# Patient Record
Sex: Male | Born: 1960 | Race: White | Hispanic: No | Marital: Married | Smoking: Never smoker
Health system: Southern US, Community
[De-identification: ages and names within clinical notes are randomized; demographics above are authoritative.]

## PROBLEM LIST (undated history)

## (undated) DIAGNOSIS — F988 Other specified behavioral and emotional disorders with onset usually occurring in childhood and adolescence: Secondary | ICD-10-CM

## (undated) DIAGNOSIS — N289 Disorder of kidney and ureter, unspecified: Secondary | ICD-10-CM

## (undated) DIAGNOSIS — E119 Type 2 diabetes mellitus without complications: Secondary | ICD-10-CM

## (undated) DIAGNOSIS — Z5189 Encounter for other specified aftercare: Secondary | ICD-10-CM

## (undated) DIAGNOSIS — E785 Hyperlipidemia, unspecified: Secondary | ICD-10-CM

## (undated) DIAGNOSIS — M1990 Unspecified osteoarthritis, unspecified site: Secondary | ICD-10-CM

## (undated) DIAGNOSIS — E559 Vitamin D deficiency, unspecified: Secondary | ICD-10-CM

## (undated) DIAGNOSIS — M2550 Pain in unspecified joint: Secondary | ICD-10-CM

## (undated) DIAGNOSIS — K259 Gastric ulcer, unspecified as acute or chronic, without hemorrhage or perforation: Secondary | ICD-10-CM

## (undated) DIAGNOSIS — R55 Syncope and collapse: Secondary | ICD-10-CM

## (undated) DIAGNOSIS — T7840XA Allergy, unspecified, initial encounter: Secondary | ICD-10-CM

## (undated) DIAGNOSIS — I10 Essential (primary) hypertension: Secondary | ICD-10-CM

## (undated) HISTORY — DX: Pain in unspecified joint: M25.50

## (undated) HISTORY — DX: Hyperlipidemia, unspecified: E78.5

## (undated) HISTORY — DX: Vitamin D deficiency, unspecified: E55.9

## (undated) HISTORY — DX: Allergy, unspecified, initial encounter: T78.40XA

## (undated) HISTORY — DX: Unspecified osteoarthritis, unspecified site: M19.90

## (undated) HISTORY — DX: Other specified behavioral and emotional disorders with onset usually occurring in childhood and adolescence: F98.8

## (undated) HISTORY — PX: COLONOSCOPY: SHX174

## (undated) HISTORY — PX: FRACTURE SURGERY: SHX138

## (undated) HISTORY — PX: UPPER GASTROINTESTINAL ENDOSCOPY: SHX188

## (undated) HISTORY — DX: Syncope and collapse: R55

## (undated) HISTORY — DX: Encounter for other specified aftercare: Z51.89

## (undated) HISTORY — PX: POLYPECTOMY: SHX149

## (undated) HISTORY — DX: Type 2 diabetes mellitus without complications: E11.9

---

## 1997-09-03 ENCOUNTER — Emergency Department (HOSPITAL_COMMUNITY): Admission: EM | Admit: 1997-09-03 | Discharge: 1997-09-03 | Payer: Self-pay | Admitting: Emergency Medicine

## 2007-12-10 ENCOUNTER — Ambulatory Visit (HOSPITAL_COMMUNITY): Admission: AD | Admit: 2007-12-10 | Discharge: 2007-12-10 | Payer: Self-pay | Admitting: Urology

## 2011-07-20 ENCOUNTER — Ambulatory Visit (HOSPITAL_COMMUNITY)
Admission: RE | Admit: 2011-07-20 | Discharge: 2011-07-20 | Disposition: A | Discharge status: Home / Self Care | Payer: 59 | Source: Ambulatory Visit | Attending: Physician Assistant | Admitting: Physician Assistant

## 2011-07-20 ENCOUNTER — Other Ambulatory Visit (HOSPITAL_COMMUNITY): Payer: Self-pay | Admitting: Physician Assistant

## 2011-07-20 DIAGNOSIS — M25569 Pain in unspecified knee: Secondary | ICD-10-CM | POA: Insufficient documentation

## 2011-07-20 DIAGNOSIS — S838X9A Sprain of other specified parts of unspecified knee, initial encounter: Secondary | ICD-10-CM

## 2011-07-20 DIAGNOSIS — S86819A Strain of other muscle(s) and tendon(s) at lower leg level, unspecified leg, initial encounter: Secondary | ICD-10-CM

## 2011-09-28 DIAGNOSIS — IMO0001 Reserved for inherently not codable concepts without codable children: Secondary | ICD-10-CM

## 2011-09-28 DIAGNOSIS — Z5189 Encounter for other specified aftercare: Secondary | ICD-10-CM

## 2011-09-28 HISTORY — DX: Encounter for other specified aftercare: Z51.89

## 2011-09-28 HISTORY — DX: Reserved for inherently not codable concepts without codable children: IMO0001

## 2011-10-25 ENCOUNTER — Encounter (HOSPITAL_BASED_OUTPATIENT_CLINIC_OR_DEPARTMENT_OTHER): Payer: Self-pay | Admitting: *Deleted

## 2011-10-25 ENCOUNTER — Inpatient Hospital Stay (HOSPITAL_BASED_OUTPATIENT_CLINIC_OR_DEPARTMENT_OTHER)
Admission: EM | Admit: 2011-10-25 | Discharge: 2011-10-27 | DRG: 174 | Disposition: A | Discharge status: Home / Self Care | Payer: BC Managed Care – PPO | Attending: Internal Medicine | Admitting: Internal Medicine

## 2011-10-25 DIAGNOSIS — K264 Chronic or unspecified duodenal ulcer with hemorrhage: Principal | ICD-10-CM | POA: Diagnosis present

## 2011-10-25 DIAGNOSIS — D62 Acute posthemorrhagic anemia: Secondary | ICD-10-CM | POA: Diagnosis present

## 2011-10-25 DIAGNOSIS — K449 Diaphragmatic hernia without obstruction or gangrene: Secondary | ICD-10-CM | POA: Diagnosis present

## 2011-10-25 DIAGNOSIS — Z79899 Other long term (current) drug therapy: Secondary | ICD-10-CM

## 2011-10-25 DIAGNOSIS — Z7982 Long term (current) use of aspirin: Secondary | ICD-10-CM

## 2011-10-25 DIAGNOSIS — K922 Gastrointestinal hemorrhage, unspecified: Secondary | ICD-10-CM

## 2011-10-25 DIAGNOSIS — K921 Melena: Secondary | ICD-10-CM

## 2011-10-25 DIAGNOSIS — R7989 Other specified abnormal findings of blood chemistry: Secondary | ICD-10-CM | POA: Diagnosis present

## 2011-10-25 DIAGNOSIS — K222 Esophageal obstruction: Secondary | ICD-10-CM

## 2011-10-25 DIAGNOSIS — I10 Essential (primary) hypertension: Secondary | ICD-10-CM | POA: Diagnosis present

## 2011-10-25 HISTORY — DX: Gastric ulcer, unspecified as acute or chronic, without hemorrhage or perforation: K25.9

## 2011-10-25 HISTORY — DX: Essential (primary) hypertension: I10

## 2011-10-25 HISTORY — DX: Disorder of kidney and ureter, unspecified: N28.9

## 2011-10-25 LAB — COMPREHENSIVE METABOLIC PANEL
ALT: 26 U/L (ref 0–53)
AST: 25 U/L (ref 0–37)
Albumin: 3.7 g/dL (ref 3.5–5.2)
Alkaline Phosphatase: 34 U/L — ABNORMAL LOW (ref 39–117)
BUN: 24 mg/dL — ABNORMAL HIGH (ref 6–23)
CO2: 23 mEq/L (ref 19–32)
Calcium: 8.5 mg/dL (ref 8.4–10.5)
Chloride: 105 mEq/L (ref 96–112)
Creatinine, Ser: 1.1 mg/dL (ref 0.50–1.35)
GFR calc Af Amer: 89 mL/min — ABNORMAL LOW (ref >90)
GFR calc non Af Amer: 77 mL/min — ABNORMAL LOW (ref >90)
Glucose, Bld: 152 mg/dL — ABNORMAL HIGH (ref 70–99)
Potassium: 4.1 mEq/L (ref 3.5–5.1)
Sodium: 137 mEq/L (ref 135–145)
Total Bilirubin: 0.4 mg/dL (ref 0.3–1.2)
Total Protein: 6.2 g/dL (ref 6.0–8.3)

## 2011-10-25 LAB — DIFFERENTIAL
Basophils Absolute: 0 10*3/uL (ref 0.0–0.1)
Basophils Relative: 0 % (ref 0–1)
Eosinophils Absolute: 0.2 10*3/uL (ref 0.0–0.7)
Eosinophils Relative: 2 % (ref 0–5)
Lymphocytes Relative: 22 % (ref 12–46)
Lymphs Abs: 2.6 10*3/uL (ref 0.7–4.0)
Monocytes Absolute: 0.8 10*3/uL (ref 0.1–1.0)
Monocytes Relative: 7 % (ref 3–12)
Neutro Abs: 8.1 10*3/uL — ABNORMAL HIGH (ref 1.7–7.7)
Neutrophils Relative %: 69 % (ref 43–77)

## 2011-10-25 LAB — CBC
HCT: 27.2 % — ABNORMAL LOW (ref 39.0–52.0)
Hemoglobin: 9.3 g/dL — ABNORMAL LOW (ref 13.0–17.0)
MCH: 31 pg (ref 26.0–34.0)
MCHC: 34.2 g/dL (ref 30.0–36.0)
MCV: 90.7 fL (ref 78.0–100.0)
Platelets: 245 10*3/uL (ref 150–400)
RBC: 3 MIL/uL — ABNORMAL LOW (ref 4.22–5.81)
RDW: 13.2 % (ref 11.5–15.5)
WBC: 11.8 10*3/uL — ABNORMAL HIGH (ref 4.0–10.5)

## 2011-10-25 LAB — LIPASE, BLOOD: Lipase: 49 U/L (ref 11–59)

## 2011-10-25 LAB — OCCULT BLOOD X 1 CARD TO LAB, STOOL: Fecal Occult Bld: POSITIVE

## 2011-10-25 MED ORDER — SODIUM CHLORIDE 0.9 % IV BOLUS (SEPSIS)
1000.0000 mL | Freq: Once | INTRAVENOUS | Status: AC
  Administered 2011-10-25: 1000 mL via INTRAVENOUS

## 2011-10-25 NOTE — ED Provider Notes (Addendum)
History     CSN: [id number]  Arrival date & time 10/25/11  2020   None     Chief Complaint  Patient presents with  . Vomiting    (Consider location/radiation/quality/duration/timing/severity/associated sxs/prior treatment) HPI Complains of vomiting approximately once per day and diarrhea once or twice per day onset 4 days ago. Symptoms accompanied by generalized weakness. No fever no abdominal pain. Patient reports he been using ibuprofen recently for back and or knee pain. He treated himself with Prilosec as he noticed blood in his stool 4 days ago. Stool was dark. Noticed a tiny amount of blood in his stool today. Presently feels well denies abdominal pain denies headache no fever no other associated symptoms. No hematemesis Past Medical History  Diagnosis Date  . Hypertension   . Renal disorder     History reviewed. No pertinent past surgical history.  No family history on file.  History  Substance Use Topics  . Smoking status: Never Smoker   . Smokeless tobacco: Not on file  . Alcohol Use: No      Review of Systems  Constitutional: Positive for fatigue.  Gastrointestinal: Positive for vomiting, diarrhea and blood in stool.    Allergies  Review of patient's allergies indicates no known allergies.  Home Medications   Current Outpatient Rx  Name Route Sig Dispense Refill  . ASPIRIN 81 MG PO TABS Oral Take 81 mg by mouth daily.    Marland Kitchen BISOPROLOL-HYDROCHLOROTHIAZIDE 5-6.25 MG PO TABS Oral Take 1 tablet by mouth daily.    . CYCLOBENZAPRINE HCL 10 MG PO TABS Oral Take 10 mg by mouth at bedtime as needed. For back pain    . IBUPROFEN 200 MG PO TABS Oral Take 200 mg by mouth every 6 (six) hours as needed. For pain    . OMEPRAZOLE MAGNESIUM 20 MG PO TBEC Oral Take 20 mg by mouth daily.      BP 146/76  Pulse 100  Temp(Src) 98.1 F (36.7 C) (Oral)  Resp 20  SpO2 98%  Physical Exam  Nursing note and vitals reviewed. Constitutional: He appears well-developed and  well-nourished.  HENT:  Head: Normocephalic and atraumatic.  Eyes: Conjunctivae are normal. Pupils are equal, round, and reactive to light.  Neck: Neck supple. No tracheal deviation present. No thyromegaly present.  Cardiovascular: Normal rate and regular rhythm.   No murmur heard. Pulmonary/Chest: Effort normal and breath sounds normal.  Abdominal: Soft. Bowel sounds are normal. He exhibits no distension. There is no tenderness.  Genitourinary: Guaiac positive stool.       Black stool  Musculoskeletal: Normal range of motion. He exhibits no edema and no tenderness.  Neurological: He is alert. Coordination normal.  Skin: Skin is warm and dry. No rash noted.  Psychiatric: He has a normal mood and affect.    ED Course  Procedures (including critical care time)   Labs Reviewed  CBC  DIFFERENTIAL  COMPREHENSIVE METABOLIC PANEL  LIPASE, BLOOD   No results found.  Results for orders placed during the hospital encounter of 10/25/11  CBC      Component Value Range   WBC 11.8 (*) 4.0 - 10.5 (K/uL)   RBC 3.00 (*) 4.22 - 5.81 (MIL/uL)   Hemoglobin 9.3 (*) 13.0 - 17.0 (g/dL)   HCT 08.6 (*) 57.8 - 52.0 (%)   MCV 90.7  78.0 - 100.0 (fL)   MCH 31.0  26.0 - 34.0 (pg)   MCHC 34.2  30.0 - 36.0 (g/dL)   RDW 46.9  62.9 -  15.5 (%)   Platelets 245  150 - 400 (K/uL)  DIFFERENTIAL      Component Value Range   Neutrophils Relative 69  43 - 77 (%)   Neutro Abs 8.1 (*) 1.7 - 7.7 (K/uL)   Lymphocytes Relative 22  12 - 46 (%)   Lymphs Abs 2.6  0.7 - 4.0 (K/uL)   Monocytes Relative 7  3 - 12 (%)   Monocytes Absolute 0.8  0.1 - 1.0 (K/uL)   Eosinophils Relative 2  0 - 5 (%)   Eosinophils Absolute 0.2  0.0 - 0.7 (K/uL)   Basophils Relative 0  0 - 1 (%)   Basophils Absolute 0.0  0.0 - 0.1 (K/uL)  COMPREHENSIVE METABOLIC PANEL      Component Value Range   Sodium 137  135 - 145 (mEq/L)   Potassium 4.1  3.5 - 5.1 (mEq/L)   Chloride 105  96 - 112 (mEq/L)   CO2 23  19 - 32 (mEq/L)   Glucose, Bld  152 (*) 70 - 99 (mg/dL)   BUN 24 (*) 6 - 23 (mg/dL)   Creatinine, Ser 1.61  0.50 - 1.35 (mg/dL)   Calcium 8.5  8.4 - 09.6 (mg/dL)   Total Protein 6.2  6.0 - 8.3 (g/dL)   Albumin 3.7  3.5 - 5.2 (g/dL)   AST 25  0 - 37 (U/L)   ALT 26  0 - 53 (U/L)   Alkaline Phosphatase 34 (*) 39 - 117 (U/L)   Total Bilirubin 0.4  0.3 - 1.2 (mg/dL)   GFR calc non Af Amer 77 (*) >90 (mL/min)   GFR calc Af Amer 89 (*) >90 (mL/min)  LIPASE, BLOOD      Component Value Range   Lipase 49  11 - 59 (U/L)  OCCULT BLOOD X 1 CARD TO LAB, STOOL      Component Value Range   Fecal Occult Bld POSITIVE     No results found.  No diagnosis found. Spoke with Dr. Kaylyn Layer , who accepts patient in transfer   MDM  Plan transfer to [facility] IV fluids, n.p.o., likely to get endoscopy Diagnosis #1 GI bleed #2 hyperglycemia 3 anemia        Doug Sou, MD 10/26/11 0021  Doug Sou, MD 10/26/11 0454

## 2011-10-25 NOTE — ED Notes (Signed)
Pt reports vomiting/diarrhea intermittently x4 days. Denies abdominal pains but states he has had lower back pain. Pt has also noticed darkened stool with streaks of bright red blood. Pt has hx of stomach ulcers. Pt admits to taking aspirin daily, but once he noticed the blood in the stool, he stopped and started taking prilosec.

## 2011-10-25 NOTE — ED Notes (Signed)
MD at bedside. 

## 2011-10-25 NOTE — ED Notes (Signed)
Vomiting off and on x 4 days. Dizzy. Diarrhea.

## 2011-10-26 ENCOUNTER — Encounter (HOSPITAL_COMMUNITY)
Admission: EM | Disposition: A | Discharge status: Home / Self Care | Payer: Self-pay | Source: Home / Self Care | Attending: Internal Medicine

## 2011-10-26 ENCOUNTER — Encounter (HOSPITAL_BASED_OUTPATIENT_CLINIC_OR_DEPARTMENT_OTHER): Payer: Self-pay | Admitting: *Deleted

## 2011-10-26 DIAGNOSIS — K2901 Acute gastritis with bleeding: Secondary | ICD-10-CM

## 2011-10-26 DIAGNOSIS — K264 Chronic or unspecified duodenal ulcer with hemorrhage: Principal | ICD-10-CM

## 2011-10-26 DIAGNOSIS — R112 Nausea with vomiting, unspecified: Secondary | ICD-10-CM

## 2011-10-26 DIAGNOSIS — I10 Essential (primary) hypertension: Secondary | ICD-10-CM

## 2011-10-26 DIAGNOSIS — K922 Gastrointestinal hemorrhage, unspecified: Secondary | ICD-10-CM

## 2011-10-26 DIAGNOSIS — D62 Acute posthemorrhagic anemia: Secondary | ICD-10-CM

## 2011-10-26 DIAGNOSIS — K222 Esophageal obstruction: Secondary | ICD-10-CM

## 2011-10-26 DIAGNOSIS — K921 Melena: Secondary | ICD-10-CM

## 2011-10-26 HISTORY — PX: ESOPHAGOGASTRODUODENOSCOPY: SHX5428

## 2011-10-26 LAB — CBC
HCT: 22.5 % — ABNORMAL LOW (ref 39.0–52.0)
HCT: 27.1 % — ABNORMAL LOW (ref 39.0–52.0)
Hemoglobin: 7.7 g/dL — ABNORMAL LOW (ref 13.0–17.0)
Hemoglobin: 9.3 g/dL — ABNORMAL LOW (ref 13.0–17.0)
MCH: 30.6 pg (ref 26.0–34.0)
MCH: 30.2 pg (ref 26.0–34.0)
MCHC: 34.2 g/dL (ref 30.0–36.0)
MCHC: 34.3 g/dL (ref 30.0–36.0)
MCV: 89.3 fL (ref 78.0–100.0)
MCV: 88 fL (ref 78.0–100.0)
Platelets: 209 10*3/uL (ref 150–400)
Platelets: 220 10*3/uL (ref 150–400)
RBC: 2.52 MIL/uL — ABNORMAL LOW (ref 4.22–5.81)
RBC: 3.08 MIL/uL — ABNORMAL LOW (ref 4.22–5.81)
RDW: 13.7 % (ref 11.5–15.5)
RDW: 14.2 % (ref 11.5–15.5)
WBC: 9.6 10*3/uL (ref 4.0–10.5)
WBC: 10 10*3/uL (ref 4.0–10.5)

## 2011-10-26 LAB — GLUCOSE, CAPILLARY
Glucose-Capillary: 111 mg/dL — ABNORMAL HIGH (ref 70–99)
Glucose-Capillary: 92 mg/dL (ref 70–99)

## 2011-10-26 LAB — COMPREHENSIVE METABOLIC PANEL
ALT: 21 U/L (ref 0–53)
AST: 21 U/L (ref 0–37)
Albumin: 3.1 g/dL — ABNORMAL LOW (ref 3.5–5.2)
Alkaline Phosphatase: 32 U/L — ABNORMAL LOW (ref 39–117)
BUN: 15 mg/dL (ref 6–23)
CO2: 25 mEq/L (ref 19–32)
Calcium: 7.9 mg/dL — ABNORMAL LOW (ref 8.4–10.5)
Chloride: 105 mEq/L (ref 96–112)
Creatinine, Ser: 1.1 mg/dL (ref 0.50–1.35)
GFR calc Af Amer: 89 mL/min — ABNORMAL LOW (ref >90)
GFR calc non Af Amer: 77 mL/min — ABNORMAL LOW (ref >90)
Glucose, Bld: 124 mg/dL — ABNORMAL HIGH (ref 70–99)
Potassium: 3.9 mEq/L (ref 3.5–5.1)
Sodium: 138 mEq/L (ref 135–145)
Total Bilirubin: 1.2 mg/dL (ref 0.3–1.2)
Total Protein: 5.4 g/dL — ABNORMAL LOW (ref 6.0–8.3)

## 2011-10-26 LAB — PREPARE RBC (CROSSMATCH)

## 2011-10-26 LAB — PROTIME-INR
INR: 1.19 (ref 0.00–1.49)
Prothrombin Time: 15.4 seconds — ABNORMAL HIGH (ref 11.6–15.2)

## 2011-10-26 LAB — ABO/RH: ABO/RH(D): A POS

## 2011-10-26 SURGERY — EGD (ESOPHAGOGASTRODUODENOSCOPY)
Anesthesia: Moderate Sedation

## 2011-10-26 MED ORDER — ACETAMINOPHEN 650 MG RE SUPP: 650.0000 mg | Freq: Four times a day (QID) | RECTAL | Status: DC | PRN

## 2011-10-26 MED ORDER — ONDANSETRON HCL 4 MG PO TABS: 4.0000 mg | ORAL_TABLET | Freq: Four times a day (QID) | ORAL | Status: DC | PRN

## 2011-10-26 MED ORDER — FENTANYL CITRATE 0.05 MG/ML IJ SOLN
INTRAMUSCULAR | Status: AC
  Filled 2011-10-26: qty 4

## 2011-10-26 MED ORDER — FENTANYL NICU IV SYRINGE 50 MCG/ML
INJECTION | INTRAMUSCULAR | Status: DC | PRN
  Administered 2011-10-26 (×3): 25 ug via INTRAVENOUS

## 2011-10-26 MED ORDER — SODIUM CHLORIDE 0.9 % IV SOLN
INTRAVENOUS | Status: AC
  Administered 2011-10-26: 02:00:00 via INTRAVENOUS

## 2011-10-26 MED ORDER — SODIUM CHLORIDE 0.9 % IV SOLN
8.0000 mg/h | INTRAVENOUS | Status: DC
  Administered 2011-10-26: 8 mg/h via INTRAVENOUS
  Filled 2011-10-26 (×6): qty 80

## 2011-10-26 MED ORDER — MIDAZOLAM HCL 10 MG/2ML IJ SOLN
INTRAMUSCULAR | Status: AC
  Filled 2011-10-26: qty 4

## 2011-10-26 MED ORDER — DIPHENHYDRAMINE HCL 50 MG/ML IJ SOLN
INTRAMUSCULAR | Status: AC
  Filled 2011-10-26: qty 1

## 2011-10-26 MED ORDER — BUTAMBEN-TETRACAINE-BENZOCAINE 2-2-14 % EX AERO
INHALATION_SPRAY | CUTANEOUS | Status: DC | PRN
  Administered 2011-10-26: 2 via TOPICAL

## 2011-10-26 MED ORDER — SODIUM CHLORIDE 0.9 % IV SOLN
INTRAVENOUS | Status: DC
  Administered 2011-10-26 – 2011-10-27 (×2): via INTRAVENOUS

## 2011-10-26 MED ORDER — MIDAZOLAM HCL 10 MG/2ML IJ SOLN
INTRAMUSCULAR | Status: DC | PRN
  Administered 2011-10-26 (×4): 2 mg via INTRAVENOUS

## 2011-10-26 MED ORDER — SODIUM CHLORIDE 0.9 % IJ SOLN
3.0000 mL | Freq: Two times a day (BID) | INTRAMUSCULAR | Status: DC
  Administered 2011-10-26: 3 mL via INTRAVENOUS

## 2011-10-26 MED ORDER — ACETAMINOPHEN 325 MG PO TABS: 650.0000 mg | ORAL_TABLET | Freq: Four times a day (QID) | ORAL | Status: DC | PRN

## 2011-10-26 MED ORDER — ONDANSETRON HCL 4 MG/2ML IJ SOLN: 4.0000 mg | Freq: Four times a day (QID) | INTRAMUSCULAR | Status: DC | PRN

## 2011-10-26 MED ORDER — DIPHENHYDRAMINE HCL 50 MG/ML IJ SOLN
INTRAMUSCULAR | Status: DC | PRN
  Administered 2011-10-26: 25 mg via INTRAVENOUS

## 2011-10-26 NOTE — Progress Notes (Signed)
   Patient seen earlier today, by my colleague Dr. Toniann Fail.  Patient seen and examined, and data base reviewed.  Melena and anemia, likely GI bleed from ulcer secondary to NSAID use.  Gastroenterology service consulted.  Clint Lipps Pager: [phone] 10/26/2011, 9:11 AM

## 2011-10-26 NOTE — Op Note (Signed)
[facility] [address] [place], [place]  [address]  ENDOSCOPY PROCEDURE REPORT  PATIENT:  Paul Warner, Paul Warner  MR#:  [id number] BIRTHDATE:  14-Jun-1960, 50 yrs. old  GENDER:  male  ENDOSCOPIST:  Wilhemina Bonito. Eda Keys, MD Referred by:  [facility],  PROCEDURE DATE:  10/26/2011 PROCEDURE:  EGD with biopsy, 43239 ASA CLASS:  Class II INDICATIONS:  melena, hematemesis  MEDICATIONS:   Fentanyl 75 mcg IV, Versed 8 mg IV, Benadryl 25 mg IV TOPICAL ANESTHETIC:  Cetacaine Spray  DESCRIPTION OF PROCEDURE:   After the risks benefits and alternatives of the procedure were thoroughly explained, informed consent was obtained.  The Pentax Gastroscope [id number] endoscope was introduced through the mouth and advanced to the third portion of the duodenum, without limitations.  The instrument was slowly withdrawn as the mucosa was fully examined. 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  A 12mm diameter somewhat deep but clean based ulcer was found in the bulb of the duodenum. No stigmata or bleeding. CLO bx tken.  Otherwise the examination was normal toD3. Retroflexed views revealed a hiatal hernia.    The scope was then withdrawn from the patient and the procedure completed.  COMPLICATIONS:  None  ENDOSCOPIC IMPRESSION: 1) Stricture in the distal esophagus 2) Ulcer in the bulb of duodenum 3) Otherwise normal examination 4) A hiatal hernia  RECOMMENDATIONS: 1) Avoid NSAIDS 2) PPI bid x 8 weeks then daily for 8 weeks 3) Rx CLO if positive 4) Advance diet. Possibly ome tomorow  ______________________________ Wilhemina Bonito. Eda Keys, MD  CC:  The Patient;   Lucky Cowboy, MD  n. Rosalie DoctorWilhemina Bonito. Eda Keys at 10/26/2011 04:10 PM  Eldred Manges, [phone]

## 2011-10-26 NOTE — ED Notes (Signed)
[facility] arrived for transport 

## 2011-10-26 NOTE — H&P (Signed)
Paul Warner is an 51 y.o. male.   PCP - Dr.William Mckeown. Chief Complaint: Nausea vomiting. HPI: 51 year old male with history of hypertension has been experiencing some nausea vomiting since Friday is almost 4 days ago. Denies any abdominal pain. He presented to the ER at the [facility] with these complaints. He was found to be anemic and stool for occult blood was positive. He had black stools. On further questioning patient states last 4-5 days he has been having black stools. He takes Advil and aspirin for many years. He takes Advil for right knee pain. Patient at this time has been admitted for acute GI bleed. Denies any dizziness loss of consciousness chest pain or shortness of breath. Patient states he has never been diagnosed with anemia previously.  Past Medical History  Diagnosis Date  . Hypertension   . Renal disorder   . Stomach ulcer     History reviewed. No pertinent past surgical history.  Family History  Problem Relation Age of Onset  . Breast cancer Mother    Social History:  reports that he has never smoked. He does not have any smokeless tobacco history on file. He reports that he drinks alcohol. He reports that he does not use illicit drugs.  Allergies: No Known Allergies  Medications Prior to Admission  Medication Sig Dispense Refill  . aspirin 81 MG tablet Take 81 mg by mouth daily.      . bisoprolol-hydrochlorothiazide (ZIAC) 5-6.25 MG per tablet Take 1 tablet by mouth daily.      . cyclobenzaprine (FLEXERIL) 10 MG tablet Take 10 mg by mouth at bedtime as needed. For back pain      . ibuprofen (ADVIL,MOTRIN) 200 MG tablet Take 200 mg by mouth every 6 (six) hours as needed. For pain      . omeprazole (PRILOSEC OTC) 20 MG tablet Take 20 mg by mouth daily.        Results for orders placed during the hospital encounter of 10/25/11 (from the past 48 hour(s))  CBC     Status: Abnormal   Collection Time   10/25/11 10:45 PM      Component Value Range  Comment   WBC 11.8 (*) 4.0 - 10.5 (K/uL)    RBC 3.00 (*) 4.22 - 5.81 (MIL/uL)    Hemoglobin 9.3 (*) 13.0 - 17.0 (g/dL)    HCT 24.4 (*) 01.0 - 52.0 (%)    MCV 90.7  78.0 - 100.0 (fL)    MCH 31.0  26.0 - 34.0 (pg)    MCHC 34.2  30.0 - 36.0 (g/dL)    RDW 27.2  53.6 - 64.4 (%)    Platelets 245  150 - 400 (K/uL)   DIFFERENTIAL     Status: Abnormal   Collection Time   10/25/11 10:45 PM      Component Value Range Comment   Neutrophils Relative 69  43 - 77 (%)    Neutro Abs 8.1 (*) 1.7 - 7.7 (K/uL)    Lymphocytes Relative 22  12 - 46 (%)    Lymphs Abs 2.6  0.7 - 4.0 (K/uL)    Monocytes Relative 7  3 - 12 (%)    Monocytes Absolute 0.8  0.1 - 1.0 (K/uL)    Eosinophils Relative 2  0 - 5 (%)    Eosinophils Absolute 0.2  0.0 - 0.7 (K/uL)    Basophils Relative 0  0 - 1 (%)    Basophils Absolute 0.0  0.0 - 0.1 (K/uL)  COMPREHENSIVE METABOLIC PANEL     Status: Abnormal   Collection Time   10/25/11 10:45 PM      Component Value Range Comment   Sodium 137  135 - 145 (mEq/L)    Potassium 4.1  3.5 - 5.1 (mEq/L)    Chloride 105  96 - 112 (mEq/L)    CO2 23  19 - 32 (mEq/L)    Glucose, Bld 152 (*) 70 - 99 (mg/dL)    BUN 24 (*) 6 - 23 (mg/dL)    Creatinine, Ser 1.61  0.50 - 1.35 (mg/dL)    Calcium 8.5  8.4 - 10.5 (mg/dL)    Total Protein 6.2  6.0 - 8.3 (g/dL)    Albumin 3.7  3.5 - 5.2 (g/dL)    AST 25  0 - 37 (U/L)    ALT 26  0 - 53 (U/L)    Alkaline Phosphatase 34 (*) 39 - 117 (U/L)    Total Bilirubin 0.4  0.3 - 1.2 (mg/dL)    GFR calc non Af Amer 77 (*) >90 (mL/min)    GFR calc Af Amer 89 (*) >90 (mL/min)   LIPASE, BLOOD     Status: Normal   Collection Time   10/25/11 10:53 PM      Component Value Range Comment   Lipase 49  11 - 59 (U/L)   OCCULT BLOOD X 1 CARD TO LAB, STOOL     Status: Normal   Collection Time   10/25/11 11:14 PM      Component Value Range Comment   Fecal Occult Bld POSITIVE     CBC     Status: Abnormal   Collection Time   10/26/11  2:55 AM      Component Value Range  Comment   WBC 9.6  4.0 - 10.5 (K/uL)    RBC 2.52 (*) 4.22 - 5.81 (MIL/uL)    Hemoglobin 7.7 (*) 13.0 - 17.0 (g/dL)    HCT 09.6 (*) 04.5 - 52.0 (%)    MCV 89.3  78.0 - 100.0 (fL)    MCH 30.6  26.0 - 34.0 (pg)    MCHC 34.2  30.0 - 36.0 (g/dL)    RDW 40.9  81.1 - 91.4 (%)    Platelets 209  150 - 400 (K/uL)   TYPE AND SCREEN     Status: Normal   Collection Time   10/26/11  3:00 AM      Component Value Range Comment   ABO/RH(D) A POS      Antibody Screen NEG      Sample Expiration 10/29/2011     ABO/RH     Status: Normal (Preliminary result)   Collection Time   10/26/11  3:00 AM      Component Value Range Comment   ABO/RH(D) A POS      No results found.  Review of Systems  Constitutional: Negative.   HENT: Negative.   Eyes: Negative.   Respiratory: Negative.   Cardiovascular: Negative.   Gastrointestinal: Positive for nausea, vomiting and melena.  Genitourinary: Negative.   Musculoskeletal: Positive for joint pain.  Skin: Negative.   Neurological: Negative.   Endo/Heme/Allergies: Negative.     Blood pressure 153/78, pulse 106, temperature 98.3 F (36.8 C), temperature source Oral, resp. rate 18, height 6' (1.829 m), weight 125.9 kg (277 lb 9 oz), SpO2 96.00%. Physical Exam  Constitutional: He is oriented to person, place, and time. He appears well-developed and well-nourished. No distress.  HENT:  Head: Normocephalic and atraumatic.  Right Ear: External ear normal.  Left Ear: External ear normal.  Nose: Nose normal.  Mouth/Throat: Oropharynx is clear and moist. No oropharyngeal exudate.  Eyes: Conjunctivae are normal. Pupils are equal, round, and reactive to light. Right eye exhibits no discharge. Left eye exhibits no discharge. No scleral icterus.  Neck: Normal range of motion. Neck supple.  Cardiovascular:       Sinus tachycardia.  Respiratory: Effort normal and breath sounds normal. No respiratory distress. He has no wheezes. He has no rales.  GI: Soft. Bowel sounds  are normal. He exhibits no distension. There is no tenderness. There is no rebound.  Musculoskeletal: Normal range of motion. He exhibits no edema and no tenderness.  Neurological: He is alert and oriented to person, place, and time.       Moves all extremities.  Skin: Skin is warm and dry. No rash noted. He is not diaphoretic. No erythema.  Psychiatric: His behavior is normal.     Assessment/Plan #1. Acute GI bleed - probably given the history of melena and NSAIDs use it is upper GI. I have Patient n.p.o. Started Protonix infusion. Patient's hemoglobin has decreased by 2 g when I rechecked CBC. I am going ahead and transfusing 2 units PRBC. Consult GI. #2. Anemia secondary to #1 reason. #3. History of hypertension - hold antihypertensives.  Patient's family history is not clear because he is adopted.  CODE STATUS - full code.  Tatijana Bierly N. 10/26/2011, 4:44 AM

## 2011-10-26 NOTE — Progress Notes (Signed)
UR Completed Haeli Gerlich Graves-Bigelow, RN,BSN [phone]  

## 2011-10-26 NOTE — Consult Note (Signed)
[facility] Consult: 9:19 AM 10/26/2011   Referring Provider: Toniann Fail Primary Care Physician:  Valetta Fuller Primary Gastroenterologist:  none  Reason for Consultation:  Upper GI bleed with melena and coffee ground emesis  HPI: Paul Warner is a 51 y.o. male.  Generally healthy with controlled htn. Told he might have an ulcer when he was in his 20's.  Says dx made on basis of visible blood in his stool.  Does not use daily PPI Last week taking 400 to 600 mg Ibuprofen for low back pain.  Friday, felt unwell and weak.  Vomitted maroonish, coffee looking emesis once.  recurrrence of the emesis along with melemic stool Saturday PM.  Felt better Sunday, but more melena and emesis that night and Monday.  Felt well enough to go to work on Tuesday but vomitted watermelon that night.  Dizzy at times with the episodes but no syncope or sweats.  He took OTC Prilosec daily beginning on Saturday. To [facility] ED last Pm.  Hgb 9.3, MCV 90, BUN 24, PT 15.4, INR 1.1.  Glucose 150. Pulse 100.  BP 146/76.  [facility]. Started on IVF and Protonix drip. Hgb this AM is 7.7    Past Medical History  Diagnosis Date  . Hypertension   . Renal disorder   . Stomach ulcer     History reviewed. No pertinent past surgical history.  Prior to Admission medications   Medication Sig Start Date End Date Taking? Authorizing Provider  aspirin 81 MG tablet Take 81 mg by mouth daily.   Yes Historical Provider, MD  bisoprolol-hydrochlorothiazide (ZIAC) 5-6.25 MG per tablet Take 1 tablet by mouth daily.   Yes Historical Provider, MD  cyclobenzaprine (FLEXERIL) 10 MG tablet Take 10 mg by mouth at bedtime as needed. For back pain   Yes Historical Provider, MD  ibuprofen (ADVIL,MOTRIN) 200 MG tablet Take 200 mg by mouth every 6 (six) hours as needed. For pain   Yes Historical Provider, MD  omeprazole (PRILOSEC OTC) 20 MG tablet Take 20 mg by mouth daily.   Yes  Historical Provider, MD    Scheduled Meds:    . sodium chloride   Intravenous STAT  . sodium chloride  1,000 mL Intravenous Once  . sodium chloride  1,000 mL Intravenous Once  . sodium chloride  3 mL Intravenous Q12H   Infusions:    . sodium chloride 100 mL/hr at 10/26/11 0449  . pantoprozole (PROTONIX) infusion 8 mg/hr (10/26/11 0631)   PRN Meds: acetaminophen, acetaminophen, ondansetron (ZOFRAN) IV, ondansetron   Allergies as of 10/25/2011  . (No Known Allergies)    Family History  Problem Relation Age of Onset  . Breast cancer Mother     History   Social History  . Marital Status: Married    Spouse Name: N/A    Number of Children: N/A  . Years of Education: N/A   Occupational History  . Not on file.   Social History Main Topics  . Smoking status: Never Smoker   . Smokeless tobacco: Not on file  . Alcohol Use: Yes     occasionally  . Drug Use: No  . Sexually Active:      REVIEW OF SYSTEMS: 14 System review: No weight fluctuation,  No anorexia until last week. Work involves heavy lifting at times, has low back pain occasionally No nose, gum bleeds.  No heartburn, no dysphagia. No polydipsia.  No hx diabetes.   No rash or skin sores. No edema, no  chest pain, no palpitations. No syncope.  No numbness or tingling Non-smoker, ETOH:  A beer mabye twice monthly. No headaches   PHYSICAL EXAM: Vital signs in last 24 hours: Temp:  [98.1 F (36.7 C)-98.4 F (36.9 C)] 98.4 F (36.9 C) (05/29 0553) Pulse Rate:  [100-115] 114  (05/29 0553) Resp:  [18-20] 18  (05/29 0553) BP: (131-163)/(60-78) 141/60 mmHg (05/29 0553) SpO2:  [96 %-100 %] 97 % (05/29 0553) Weight:  [277 lb 9 oz (125.9 kg)-283 lb (128.368 kg)] 277 lb 9 oz (125.9 kg) (05/29 0218)  Vital signs: BP 153/84  Pulse 102  Temp(Src) 98.9 F (37.2 C) (Oral)  Resp 47  Ht 6' (1.829 m)  Wt 277 lb 9 oz (125.9 kg)  BMI 37.64 kg/m2  SpO2 100%  Constitutional: generally well-appearing, no acute  distress Psychiatric: alert and oriented x3, cooperative Eyes: extraocular movements intact, anicteric, conjunctiva pink Mouth: oral pharynx moist, no lesions Neck: supple no lymphadenopathy Cardiovascular: heart regular rate and rhythm, no murmur Lungs: clear to auscultation bilaterally Abdomen: soft, nontender, nondistended, no obvious ascites, no peritoneal signs, normal bowel sounds, no organomegaly Rectal: HEME + Extremities: no lower extremity edema bilaterally Skin: no lesions on visible extremities Neuro: No focal deficits. No asterixis.   Intake/Output from previous day:   Intake/Output this shift:    LAB RESULTS:  Basename 10/26/11 0255 10/25/11 2245  WBC 9.6 11.8*  HGB 7.7* 9.3*  HCT 22.5* 27.2*  PLT 209 245   BMET Lab Results  Component Value Date   NA 137 10/25/2011   K 4.1 10/25/2011   CL 105 10/25/2011   CO2 23 10/25/2011   GLUCOSE 152* 10/25/2011   BUN 24* 10/25/2011   CREATININE 1.10 10/25/2011   CALCIUM 8.5 10/25/2011   LFT  Basename 10/25/11 2245  PROT 6.2  ALBUMIN 3.7  AST 25  ALT 26  ALKPHOS 34*  BILITOT 0.4  BILIDIR --  IBILI --   PT/INR Lab Results  Component Value Date   INR 1.19 10/26/2011   RADIOLOGY STUDIES: none  ENDOSCOPIC STUDIES: none  IMPRESSION: *  Upper GI bleed. Rule out ulcer, gastritis, MW tear *  Possible prior hx ulcer dz in his 20's *  ABL anemia   PLAN: *  EGD today *  Decision  Re: blood transfusion per attending. Currently not symptomatic from gi bleed so not clear need for blood at present.    LOS: 1 day   Jennye Moccasin  10/26/2011, 9:19 AM Pager: [phone]      GI ATTENDING  SEEN AND EXAMINED. AGREE WITH ABOVE. PLAN EGD FOR UGI BLEED WITH ANEMIA.The nature of the procedure, as well as the risks, benefits, and alternatives were carefully and thoroughly reviewed with the patient. Ample time for discussion and questions allowed. The patient understood, was satisfied, and agreed to proceed.   Wilhemina Bonito.  Eda Keys., M.D. [facility] Division of Gastroenterology

## 2011-10-26 NOTE — Care Management Note (Unsigned)
    Page 1 of 1   10/26/2011     2:39:04 PM   CARE MANAGEMENT NOTE 10/26/2011  Patient:  Paul Warner, Paul Warner   Account Number:  [id number]  Date Initiated:  10/26/2011  Documentation initiated by:  GRAVES-BIGELOW,Karie Skowron  Subjective/Objective Assessment:   Pt admitted with N/V had some tarry stools. Hgb 7.7 today. Plan for transfusion of 2 units PRBC.     Action/Plan:   CM will continue ot monitor for disposition needs.   Anticipated DC Date:  10/29/2011   Anticipated DC Plan:  HOME/SELF CARE      DC Planning Services  CM consult      Choice offered to / List presented to:             Status of service:  In process, will continue to follow Medicare Important Message given?   (If response is "NO", the following Medicare IM given date fields will be blank) Date Medicare IM given:   Date Additional Medicare IM given:    Discharge Disposition:    Per UR Regulation:  Reviewed for med. necessity/level of care/duration of stay  If discussed at Long Length of Stay Meetings, dates discussed:    Comments:

## 2011-10-27 ENCOUNTER — Encounter: Payer: Self-pay | Admitting: Internal Medicine

## 2011-10-27 ENCOUNTER — Encounter (HOSPITAL_COMMUNITY): Payer: Self-pay | Admitting: Internal Medicine

## 2011-10-27 DIAGNOSIS — R112 Nausea with vomiting, unspecified: Secondary | ICD-10-CM

## 2011-10-27 DIAGNOSIS — K2901 Acute gastritis with bleeding: Secondary | ICD-10-CM

## 2011-10-27 DIAGNOSIS — I10 Essential (primary) hypertension: Secondary | ICD-10-CM

## 2011-10-27 LAB — TYPE AND SCREEN
ABO/RH(D): A POS
Antibody Screen: NEGATIVE
Unit division: 0
Unit division: 0

## 2011-10-27 LAB — GLUCOSE, CAPILLARY
Glucose-Capillary: 120 mg/dL — ABNORMAL HIGH (ref 70–99)
Glucose-Capillary: 134 mg/dL — ABNORMAL HIGH (ref 70–99)

## 2011-10-27 LAB — CLOTEST (H. PYLORI), BIOPSY: Helicobacter screen: NEGATIVE

## 2011-10-27 MED ORDER — PANTOPRAZOLE SODIUM 40 MG PO TBEC: 40.0000 mg | DELAYED_RELEASE_TABLET | Freq: Two times a day (BID) | ORAL | Status: DC

## 2011-10-27 NOTE — Discharge Summary (Signed)
Physician Discharge Summary  Paul Warner ZOX:[id number] DOB: June 27, 1960 DOA: 10/25/2011  PCP: Nadean Corwin, MD, MD  Admit date: 10/25/2011 Discharge date: 10/27/2011  Discharge Diagnoses:  Principal Problem:  *GI bleed Active Problems:  Anemia  HTN (hypertension)  Duodenal ulcer with hemorrhage  Stricture and stenosis of esophagus  Blood in stool  Acute posthemorrhagic anemia   1. Gastrointestinal bleed secondary to duodenal ulcer  Discharge Condition: Stable  Diet recommendation: Regular diet  History of present illness:  51 year old male with history of hypertension has been experiencing some nausea vomiting since Friday is almost 4 days ago. Denies any abdominal pain. He presented to the ER at the [facility] with these complaints. He was found to be anemic and stool for occult blood was positive. He had black stools. On further questioning patient states last 4-5 days he has been having black stools. He takes Advil and aspirin for many years. He takes Advil for right knee pain. Patient at this time has been admitted for acute GI bleed. Denies any dizziness loss of consciousness chest pain or shortness of breath. Patient states he has never been diagnosed with anemia previously.   Hospital Course:   1. Gastrointestinal bleed: As mentioned above patient was presented with melanotic stools which is positive for fecal occult blood testing. After patient admitted to the hospital gastroenterology service was consulted. Patient was kept n.p.o. and EGD was done on 10/26/2011 by Dr. Yancey Flemings, see findings below. The peptic ulcer is likely secondary to NSAIDs use. Gastroenterology recommended to avoid NSAIDs and to take a PPI twice a day for 2 month. Patient also only screening colonoscopy as he is 50 now. This is to be done as outpatient.  2. Anemia: This is acute blood loss anemia secondary to upper GI bleed. Patient came in with hemoglobin of 9.3 and went down to  7.7 the very next day. Patient had 2 units of packed RBCs transfused on 10/26/2011. With hemoglobin excellent response to 9.3. After the transfusion patient denies any dizziness denies any shortness of breath.  3. Hypertension: Antihypertensive medication were held at the time of admission, the time of discharge was restarted.  Procedures:  EGD done 10/26/2011 by Dr. Yancey Flemings showed stricture in the distal esophagus, ulcer in the bulb of the duodenum and hiatal hernia  Consultations:  [facility]  Discharge Exam: Filed Vitals:   10/27/11 0603  BP: 127/62  Pulse: 95  Temp: 98.5 F (36.9 C)  Resp: 18   Filed Vitals:   10/26/11 2042 10/26/11 2115 10/26/11 2135 10/27/11 0603  BP: 109/73 130/69 115/63 127/62  Pulse: 94 100 98 95  Temp: 98.6 F (37 C) 98 F (36.7 C) 98 F (36.7 C) 98.5 F (36.9 C)  TempSrc: Oral Oral Oral Oral  Resp: 20 20 18 18   Height:      Weight:      SpO2:    98%   Physical Exam:  General: Alert and awake oriented x2 not in any acute distress.  HEENT: anicteric sclera, pupils equal reactive to light and accommodation  CVS: S1-S2 heard, no murmur rubs or gallops  Chest: clear to auscultation bilaterally, no wheezing rales or rhonchi  Abdomen: normal bowel sounds, soft, nontender, nondistended, no organomegaly  Neuro: Cranial nerves II-XII intact, no focal neurological deficits  Extremities: no cyanosis, no clubbing or edema noted bilaterally  Discharge Instructions  Discharge Orders    Future Orders Please Complete By Expires   Increase activity slowly  Medication List  As of 10/27/2011 10:12 AM   STOP taking these medications         ibuprofen 200 MG tablet      omeprazole 20 MG tablet         TAKE these medications         aspirin 81 MG tablet   Take 81 mg by mouth daily.      bisoprolol-hydrochlorothiazide 5-6.25 MG per tablet   Commonly known as: ZIAC   Take 1 tablet by mouth daily.      cyclobenzaprine 10 MG  tablet   Commonly known as: FLEXERIL   Take 10 mg by mouth at bedtime as needed. For back pain      pantoprazole 40 MG tablet   Commonly known as: PROTONIX   Take 1 tablet (40 mg total) by mouth 2 (two) times daily.           Follow-up Information    Follow up with MCKEOWN,WILLIAM DAVID, MD in 2 weeks.   Contact information:   [address] [place] [address] [phone]           The results of significant diagnostics from this hospitalization (including imaging, microbiology, ancillary and laboratory) are listed below for reference.    Significant Diagnostic Studies: No results found.  Microbiology: No results found for this or any previous visit (from the past 240 hour(s)).   Labs: Basic Metabolic Panel:  Lab 10/26/11 6213 10/25/11 2245  NA 138 137  K 3.9 4.1  CL 105 105  CO2 25 23  GLUCOSE 124* 152*  BUN 15 24*  CREATININE 1.10 1.10  CALCIUM 7.9* 8.5  MG -- --  PHOS -- --   Liver Function Tests:  Lab 10/26/11 2230 10/25/11 2245  AST 21 25  ALT 21 26  ALKPHOS 32* 34*  BILITOT 1.2 0.4  PROT 5.4* 6.2  ALBUMIN 3.1* 3.7    Lab 10/25/11 2253  LIPASE 49  AMYLASE --   No results found for this basename: AMMONIA:5 in the last 168 hours CBC:  Lab 10/26/11 2230 10/26/11 0255 10/25/11 2245  WBC 10.0 9.6 11.8*  NEUTROABS -- -- 8.1*  HGB 9.3* 7.7* 9.3*  HCT 27.1* 22.5* 27.2*  MCV 88.0 89.3 90.7  PLT 220 209 245   Cardiac Enzymes: No results found for this basename: CKTOTAL:5,CKMB:5,CKMBINDEX:5,TROPONINI:5 in the last 168 hours BNP: BNP (last 3 results) No results found for this basename: PROBNP:3 in the last 8760 hours CBG:  Lab 10/27/11 0602 10/27/11 0004 10/26/11 1805 10/26/11 1302  GLUCAP 120* 134* 92 111*    Time coordinating discharge: 40 minutes  Signed:  Clint Lipps, MD  [facility] 10/27/2011, 10:12 AM

## 2011-10-27 NOTE — Progress Notes (Signed)
     [facility] Daily Rounding Note 10/27/2011, 9:24 AM  SUBJECTIVE:    Fells well.  Wants to shower.  Not dizzyl.  No melenic stool.  Tolerating full liquids.    OBJECTIVE:        General: Looks well     Vital signs in last 24 hours:    Temp:  [97.5 F (36.4 C)-98.9 F (37.2 C)] 98.5 F (36.9 C) (05/30 0603) Pulse Rate:  [75-114] 95  (05/30 0603) Resp:  [11-97] 18  (05/30 0603) BP: (91-153)/(49-94) 127/62 mmHg (05/30 0603) SpO2:  [90 %-100 %] 98 % (05/30 0603) Last BM Date: 10/26/11  Heart: RRR Chest: clear B  Abdomen: soft, NT. ND.  Active BS  Extremities: no pedal edema Neuro/Psych:  Not confused. Relaxed. Cooperative.    Lab Results:  Basename 10/26/11 2230 10/26/11 0255 10/25/11 2245  WBC 10.0 9.6 11.8*  HGB 9.3* 7.7* 9.3*  HCT 27.1* 22.5* 27.2*  PLT 220 209 245   BMET  Basename 10/26/11 2230 10/25/11 2245  NA 138 137  K 3.9 4.1  CL 105 105  CO2 25 23  GLUCOSE 124* 152*  BUN 15 24*  CREATININE 1.10 1.10  CALCIUM 7.9* 8.5    ASSESMENT: *  Duodenal bulb ulcer with associated GI bleed.  Ulcer deep but clean-based.  HH and asymptomatic esoph stricture also noted. Clo bx results pending *  Hyperglycemia.  Needs to have fasting glucose checked as outpt. *  Azotemia, resolved. Marland Kitchen   PLAN: *  Treat with course of abx if clo positive. *  2 months BID PPI, generic omeprazole ok.  Then 2 months q day therapy. *  Will arrange for out pt screening colonoscopy in coming weeks vs months.  He is 50. * NO NSAIDS   LOS: 2 days   Jennye Moccasin  10/27/2011, 9:24 AM Pager: [phone]   GI ATTENDING  NO FURTHER BLEEDING FROM DUODENAL ULCER. HG STABLE. HAS GERD AS WELL. NEEDS OUT PATIENT COLONOSCOPY (ADVISED). PLAN AS OUTLINED. OK TO GO HOME  Kimoni Pickerill N. Eda Keys., M.D. [facility] Division of Gastroenterology

## 2011-10-27 NOTE — Discharge Instructions (Signed)
Avoid NSAIDs 

## 2011-11-22 ENCOUNTER — Telehealth: Payer: Self-pay | Admitting: *Deleted

## 2011-11-22 NOTE — Telephone Encounter (Signed)
Attempted to call mr. Paul Warner at his home number and was told it was a wrong number.  No show letter sent.

## 2011-11-28 ENCOUNTER — Ambulatory Visit (AMBULATORY_SURGERY_CENTER): Payer: BC Managed Care – PPO | Admitting: *Deleted

## 2011-11-28 VITALS — Ht 72.0 in | Wt 272.0 lb

## 2011-11-28 DIAGNOSIS — Z1211 Encounter for screening for malignant neoplasm of colon: Secondary | ICD-10-CM

## 2011-11-28 MED ORDER — MOVIPREP 100 G PO SOLR: ORAL | Status: DC

## 2011-12-06 ENCOUNTER — Ambulatory Visit (AMBULATORY_SURGERY_CENTER): Payer: BC Managed Care – PPO | Admitting: Internal Medicine

## 2011-12-06 ENCOUNTER — Encounter: Payer: Self-pay | Admitting: Internal Medicine

## 2011-12-06 ENCOUNTER — Encounter: Payer: 59 | Admitting: Internal Medicine

## 2011-12-06 VITALS — BP 147/88 | HR 95 | Temp 98.1°F | Resp 20 | Ht 72.0 in | Wt 272.0 lb

## 2011-12-06 DIAGNOSIS — D126 Benign neoplasm of colon, unspecified: Secondary | ICD-10-CM

## 2011-12-06 DIAGNOSIS — Z1211 Encounter for screening for malignant neoplasm of colon: Secondary | ICD-10-CM

## 2011-12-06 LAB — HM COLONOSCOPY

## 2011-12-06 MED ORDER — SODIUM CHLORIDE 0.9 % IV SOLN: 500.0000 mL | INTRAVENOUS | Status: DC

## 2011-12-06 NOTE — Progress Notes (Signed)
Patient did not experience any of the following events: a burn prior to discharge; a fall within the facility; wrong site/side/patient/procedure/implant event; or a hospital transfer or hospital admission upon discharge from the facility. (G8907) Patient did not have preoperative order for IV antibiotic SSI prophylaxis. (G8918)  

## 2011-12-06 NOTE — Patient Instructions (Addendum)
Discharge instructions given with verbal understanding. Handout on polyps given. Resume previous medications. YOU HAD AN ENDOSCOPIC PROCEDURE TODAY AT THE [facility]: Refer to the procedure report that was given to you for any specific questions about what was found during the examination.  If the procedure report does not answer your questions, please call your gastroenterologist to clarify.  If you requested that your care partner not be given the details of your procedure findings, then the procedure report has been included in a sealed envelope for you to review at your convenience later.  YOU SHOULD EXPECT: Some feelings of bloating in the abdomen. Passage of more gas than usual.  Walking can help get rid of the air that was put into your GI tract during the procedure and reduce the bloating. If you had a lower endoscopy (such as a colonoscopy or flexible sigmoidoscopy) you may notice spotting of blood in your stool or on the toilet paper. If you underwent a bowel prep for your procedure, then you may not have a normal bowel movement for a few days.  DIET: Your first meal following the procedure should be a light meal and then it is ok to progress to your normal diet.  A half-sandwich or bowl of soup is an example of a good first meal.  Heavy or fried foods are harder to digest and may make you feel nauseous or bloated.  Likewise meals heavy in dairy and vegetables can cause extra gas to form and this can also increase the bloating.  Drink plenty of fluids but you should avoid alcoholic beverages for 24 hours.  ACTIVITY: Your care partner should take you home directly after the procedure.  You should plan to take it easy, moving slowly for the rest of the day.  You can resume normal activity the day after the procedure however you should NOT DRIVE or use heavy machinery for 24 hours (because of the sedation medicines used during the test).    SYMPTOMS TO REPORT IMMEDIATELY: A  gastroenterologist can be reached at any hour.  During normal business hours, 8:30 AM to 5:00 PM Monday through Friday, call [phone].  After hours and on weekends, please call the GI answering service at [phone] who will take a message and have the physician on call contact you.   Following lower endoscopy (colonoscopy or flexible sigmoidoscopy):  Excessive amounts of blood in the stool  Significant tenderness or worsening of abdominal pains  Swelling of the abdomen that is new, acute  Fever of 100F or higher  FOLLOW UP: If any biopsies were taken you will be contacted by phone or by letter within the next 1-3 weeks.  Call your gastroenterologist if you have not heard about the biopsies in 3 weeks.  Our staff will call the home number listed on your records the next business day following your procedure to check on you and address any questions or concerns that you may have at that time regarding the information given to you following your procedure. This is a courtesy call and so if there is no answer at the home number and we have not heard from you through the emergency physician on call, we will assume that you have returned to your regular daily activities without incident.  SIGNATURES/CONFIDENTIALITY: You and/or your care partner have signed paperwork which will be entered into your electronic medical record.  These signatures attest to the fact that that the information above on your After Visit Summary has   been reviewed and is understood.  Full responsibility of the confidentiality of this discharge information lies with you and/or your care-partner. 

## 2011-12-06 NOTE — Op Note (Signed)
[facility] [address] [place], [place]  [address]  COLONOSCOPY PROCEDURE REPORT  PATIENT:  Paul Warner, Paul Warner  MR#:  [id number] BIRTHDATE:  07-Dec-1960, 50 yrs. old  GENDER:  male ENDOSCOPIST:  Wilhemina Bonito. Eda Keys, MD REF. BY:  .Direct /  Self, (SEEN IN HOSPITAL FOR BLEEDING D.U. 09-2011) PROCEDURE DATE:  12/06/2011 PROCEDURE:  Colonoscopy with snare polypectomy x 6 ASA CLASS:  Class II INDICATIONS:  Routine Risk Screening MEDICATIONS:   MAC sedation, administered by CRNA, propofol (Diprivan) 400 mg IV  DESCRIPTION OF PROCEDURE:   After the risks benefits and alternatives of the procedure were thoroughly explained, informed consent was obtained.  Digital rectal exam was performed and revealed no abnormalities.   The LB CF-[id number] endoscope was introduced through the anus and advanced to the cecum, which was identified by both the appendix and ileocecal valve, without limitations.  The quality of the prep was excellent, using MoviPrep.  The instrument was then slowly withdrawn as the colon was fully examined. <<PROCEDUREIMAGES>>  FINDINGS:  There were multiple polyps identified and removed - 6mm in the cecum, 67mm,5mm ascending, 74mm,5mm transverse, and 6mm rectal. Polyps were snared without cautery. Retrieval was successful.Otherwise normal colonoscopy without other polyps, masses, vascular ectasias, or inflammatory changes.   Retroflexed views in the rectum revealed no abnormalities.    The time to cecum = 1:49  minutes. The scope was then withdrawn in 15:48 minutes from the cecum and the procedure completed.  COMPLICATIONS:  None ENDOSCOPIC IMPRESSION: 1) Polyps, multiple in the colon - removed 2) Otherwise nl colonoscopy  RECOMMENDATIONS: 1) Repeat Colonoscopy in 3 years.  ______________________________ Wilhemina Bonito. Eda Keys, MD  CC:  Lucky Cowboy, MD; The Patient  n. eSIGNED:   Wilhemina Bonito. Eda Keys at 12/06/2011 02:27 PM  Eldred Manges, [phone]

## 2011-12-07 ENCOUNTER — Telehealth: Payer: Self-pay | Admitting: *Deleted

## 2011-12-07 NOTE — Telephone Encounter (Signed)
  Follow up Call-  Call back number 12/06/2011  Post procedure Call Back phone  # [phone]  Permission to leave phone message Yes     Patient questions:  Do you have a fever, pain , or abdominal swelling? no Pain Score  0 *  Have you tolerated food without any problems? yes  Have you been able to return to your normal activities? yes  Do you have any questions about your discharge instructions: Diet   no Medications  no Follow up visit  no  Do you have questions or concerns about your Care? no  Actions: * If pain score is 4 or above: No action needed, pain <4.

## 2011-12-12 ENCOUNTER — Encounter: Payer: Self-pay | Admitting: Internal Medicine

## 2013-04-15 ENCOUNTER — Other Ambulatory Visit: Payer: Self-pay | Admitting: Internal Medicine

## 2013-05-03 ENCOUNTER — Encounter: Payer: Self-pay | Admitting: Internal Medicine

## 2013-05-03 DIAGNOSIS — E1169 Type 2 diabetes mellitus with other specified complication: Secondary | ICD-10-CM | POA: Insufficient documentation

## 2013-05-03 DIAGNOSIS — I10 Essential (primary) hypertension: Secondary | ICD-10-CM | POA: Insufficient documentation

## 2013-05-03 DIAGNOSIS — E1159 Type 2 diabetes mellitus with other circulatory complications: Secondary | ICD-10-CM | POA: Insufficient documentation

## 2013-05-06 ENCOUNTER — Ambulatory Visit: Payer: Self-pay | Admitting: Physician Assistant

## 2013-05-16 ENCOUNTER — Ambulatory Visit (INDEPENDENT_AMBULATORY_CARE_PROVIDER_SITE_OTHER): Payer: BC Managed Care – PPO

## 2013-05-16 DIAGNOSIS — E291 Testicular hypofunction: Secondary | ICD-10-CM

## 2013-05-16 MED ORDER — TESTOSTERONE CYPIONATE 200 MG/ML IM SOLN
400.0000 mg | Freq: Once | INTRAMUSCULAR | Status: AC
  Administered 2013-05-16: 400 mg via INTRAMUSCULAR

## 2013-05-16 NOTE — Progress Notes (Signed)
Patient ID: Paul Warner, male   DOB: [birth year], 52 y.o.   MRN: [id number]  Patient here today for testosterone injection, 2 cc IM right glut. Patient tolerated well.

## 2013-06-06 ENCOUNTER — Ambulatory Visit: Payer: Self-pay

## 2013-06-10 ENCOUNTER — Ambulatory Visit (INDEPENDENT_AMBULATORY_CARE_PROVIDER_SITE_OTHER): Payer: BC Managed Care – PPO

## 2013-06-10 DIAGNOSIS — E291 Testicular hypofunction: Secondary | ICD-10-CM

## 2013-06-10 MED ORDER — TESTOSTERONE CYPIONATE 200 MG/ML IM SOLN
400.0000 mg | Freq: Once | INTRAMUSCULAR | Status: AC
  Administered 2013-06-10: 400 mg via INTRAMUSCULAR

## 2013-06-10 NOTE — Progress Notes (Signed)
Patient ID: Paul Warner, male   DOB: 02/22/1961, 53 y.o.   MRN: [id number] Patient here today for testosterone injection. Patient received 2 ml IM Left glut. Patient tolerated well.

## 2013-07-04 ENCOUNTER — Ambulatory Visit: Payer: Self-pay | Admitting: Physician Assistant

## 2013-07-08 ENCOUNTER — Ambulatory Visit: Payer: Self-pay | Admitting: Physician Assistant

## 2013-07-08 ENCOUNTER — Encounter: Payer: Self-pay | Admitting: Internal Medicine

## 2013-07-10 ENCOUNTER — Encounter (INDEPENDENT_AMBULATORY_CARE_PROVIDER_SITE_OTHER): Payer: Self-pay | Admitting: Internal Medicine

## 2013-07-10 DIAGNOSIS — Z0000 Encounter for general adult medical examination without abnormal findings: Secondary | ICD-10-CM

## 2013-10-01 ENCOUNTER — Ambulatory Visit (INDEPENDENT_AMBULATORY_CARE_PROVIDER_SITE_OTHER): Payer: BC Managed Care – PPO | Admitting: Internal Medicine

## 2013-10-01 ENCOUNTER — Encounter: Payer: Self-pay | Admitting: Internal Medicine

## 2013-10-01 VITALS — BP 146/84 | HR 64 | Temp 98.1°F | Resp 16 | Ht 71.5 in | Wt 283.2 lb

## 2013-10-01 DIAGNOSIS — E291 Testicular hypofunction: Secondary | ICD-10-CM

## 2013-10-01 DIAGNOSIS — E785 Hyperlipidemia, unspecified: Secondary | ICD-10-CM

## 2013-10-01 DIAGNOSIS — I10 Essential (primary) hypertension: Secondary | ICD-10-CM

## 2013-10-01 DIAGNOSIS — E349 Endocrine disorder, unspecified: Secondary | ICD-10-CM | POA: Insufficient documentation

## 2013-10-01 DIAGNOSIS — R7309 Other abnormal glucose: Secondary | ICD-10-CM

## 2013-10-01 DIAGNOSIS — E559 Vitamin D deficiency, unspecified: Secondary | ICD-10-CM

## 2013-10-01 DIAGNOSIS — Z79899 Other long term (current) drug therapy: Secondary | ICD-10-CM

## 2013-10-01 LAB — HEPATIC FUNCTION PANEL
ALT: 63 U/L — ABNORMAL HIGH (ref 0–53)
AST: 45 U/L — ABNORMAL HIGH (ref 0–37)
Albumin: 4.5 g/dL (ref 3.5–5.2)
Alkaline Phosphatase: 62 U/L (ref 39–117)
Bilirubin, Direct: 0.2 mg/dL (ref 0.0–0.3)
Indirect Bilirubin: 0.7 mg/dL (ref 0.2–1.2)
Total Bilirubin: 0.9 mg/dL (ref 0.2–1.2)
Total Protein: 7.2 g/dL (ref 6.0–8.3)

## 2013-10-01 LAB — BASIC METABOLIC PANEL WITH GFR
BUN: 14 mg/dL (ref 6–23)
CO2: 26 mEq/L (ref 19–32)
Calcium: 9.8 mg/dL (ref 8.4–10.5)
Chloride: 103 mEq/L (ref 96–112)
Creat: 1.05 mg/dL (ref 0.50–1.35)
GFR, Est African American: >89 mL/min
GFR, Est Non African American: 81 mL/min
Glucose, Bld: 197 mg/dL — ABNORMAL HIGH (ref 70–99)
Potassium: 5.1 mEq/L (ref 3.5–5.3)
Sodium: 139 mEq/L (ref 135–145)

## 2013-10-01 LAB — CBC WITH DIFFERENTIAL/PLATELET
Basophils Absolute: 0.1 10*3/uL (ref 0.0–0.1)
Basophils Relative: 1 % (ref 0–1)
Eosinophils Absolute: 0.3 10*3/uL (ref 0.0–0.7)
Eosinophils Relative: 5 % (ref 0–5)
HCT: 47.6 % (ref 39.0–52.0)
Hemoglobin: 16.7 g/dL (ref 13.0–17.0)
Lymphocytes Relative: 36 % (ref 12–46)
Lymphs Abs: 2 10*3/uL (ref 0.7–4.0)
MCH: 31.9 pg (ref 26.0–34.0)
MCHC: 35.1 g/dL (ref 30.0–36.0)
MCV: 91 fL (ref 78.0–100.0)
Monocytes Absolute: 0.4 10*3/uL (ref 0.1–1.0)
Monocytes Relative: 7 % (ref 3–12)
Neutro Abs: 2.8 10*3/uL (ref 1.7–7.7)
Neutrophils Relative %: 51 % (ref 43–77)
Platelets: 220 10*3/uL (ref 150–400)
RBC: 5.23 MIL/uL (ref 4.22–5.81)
RDW: 13.1 % (ref 11.5–15.5)
WBC: 5.5 10*3/uL (ref 4.0–10.5)

## 2013-10-01 LAB — TESTOSTERONE: Testosterone: 184 ng/dL — ABNORMAL LOW (ref 300–890)

## 2013-10-01 LAB — TSH: TSH: 2.236 u[IU]/mL (ref 0.350–4.500)

## 2013-10-01 LAB — LIPID PANEL
Cholesterol: 185 mg/dL (ref 0–200)
HDL: 40 mg/dL (ref >39)
LDL Cholesterol: 113 mg/dL — ABNORMAL HIGH (ref 0–99)
Total CHOL/HDL Ratio: 4.6 Ratio
Triglycerides: 159 mg/dL — ABNORMAL HIGH (ref <150)
VLDL: 32 mg/dL (ref 0–40)

## 2013-10-01 LAB — HEMOGLOBIN A1C
Hgb A1c MFr Bld: 6.2 % — ABNORMAL HIGH (ref <5.7)
Mean Plasma Glucose: 131 mg/dL — ABNORMAL HIGH (ref <117)

## 2013-10-01 LAB — MAGNESIUM: Magnesium: 1.8 mg/dL (ref 1.5–2.5)

## 2013-10-01 MED ORDER — ERGOCALCIFEROL 1.25 MG (50000 UT) PO CAPS: 50000.0000 [IU] | ORAL_CAPSULE | Freq: Every day | ORAL | Status: DC

## 2013-10-01 MED ORDER — PHENTERMINE HCL 37.5 MG PO TABS: ORAL_TABLET | ORAL | Status: DC

## 2013-10-01 MED ORDER — TESTOSTERONE CYPIONATE 200 MG/ML IM SOLN: 400.0000 mg | INTRAMUSCULAR | Status: DC

## 2013-10-01 NOTE — Progress Notes (Signed)
Patient ID: Paul Warner, male   DOB: 12-Aug-1960, 53 y.o.   MRN: [id number]    This very nice 53 y.o. MWM presents for 3 month follow up with Hypertension, Hyperlipidemia, Pre-Diabetes, Testosterone and Vitamin D Deficiency.    HTN predates since 2001 and patient has been off and on therapy . BP has been controlled at home. Today's BP: 146/84 mmHg. Patient denies any cardiac type chest pain, palpitations, dyspnea/orthopnea/PND, dizziness, claudication, or dependent edema.   Hyperlipidemia is controlled with diet & meds. Last Cholesterol was 172, Triglycerides were 255, HDL 38 and LDL 83 in Aug 2014. Patient denies myalgias or other med SE's.    Also, the patient has history of Obesity with BMI 39 and consequent PreDiabetes with A1c 5.8% in Aug 2013 and as high as 6.4% in Dec 2013 and last A1c was 5.9% in Aug 2014. Patient denies any symptoms of reactive hypoglycemia, diabetic polys, paresthesias or visual blurring.   Further, Patient has history of Vitamin D Deficiency of 20 in 2008 with last vitamin D of  86 in Aug 2014. Patient supplements vitamin D without any suspected side-effects.    Medication List       aspirin 81 MG tablet  Take 81 mg by mouth daily. PRN     bisoprolol-hydrochlorothiazide 5-6.25 MG per tablet  Commonly known as:  ZIAC  Take 1 tablet by mouth daily.     cyclobenzaprine 10 MG tablet  Commonly known as:  FLEXERIL  Take 10 mg by mouth at bedtime as needed. For back pain     ergocalciferol 50000 UNITS capsule  Commonly known as:  VITAMIN D2  Take 1 capsule (50,000 Units total) by mouth daily. As directed     pantoprazole 40 MG tablet  Commonly known as:  PROTONIX  TAKE 1 TABLET BY MOUTH TWICE A DAY     phentermine 37.5 MG tablet  Commonly known as:  ADIPEX-P  Take 1/2 to 1 tablet every morning for weight loss     testosterone cypionate 200 MG/ML injection  Commonly known as:  DEPOTESTOTERONE CYPIONATE  Inject 2 mLs (400 mg total) into the muscle every  14 (fourteen) days.       Allergies  Allergen Reactions  . Codeine Nausea And Vomiting  . Nsaids Other (See Comments)    Bleeding ulcer 2013   PMHx:   Past Medical History  Diagnosis Date  . Blood transfusion 09/2011    2 units  . Hypertension   . Renal disorder   . Hyperlipidemia   . Stomach ulcer   . Allergy     seasonal   FHx:    Reviewed / unchanged  SHx:    Reviewed / unchanged   Systems Review: Constitutional: Denies fever, chills, wt changes, headaches, insomnia, fatigue, night sweats, change in appetite. Eyes: Denies redness, blurred vision, diplopia, discharge, itchy, watery eyes.  ENT: Denies discharge, congestion, post nasal drip, epistaxis, sore throat, earache, hearing loss, dental pain, tinnitus, vertigo, sinus pain, snoring.  CV: Denies chest pain, palpitations, irregular heartbeat, syncope, dyspnea, diaphoresis, orthopnea, PND, claudication or edema. Respiratory: denies cough, dyspnea, DOE, pleurisy, hoarseness, laryngitis, wheezing.  Gastrointestinal: Denies dysphagia, odynophagia, heartburn, reflux, water brash, abdominal pain or cramps, nausea, vomiting, bloating, diarrhea, constipation, hematemesis, melena, hematochezia  or hemorrhoids. Genitourinary: Denies dysuria, frequency, urgency, nocturia, hesitancy, discharge, hematuria or flank pain. Musculoskeletal: Denies arthralgias, myalgias, stiffness, jt. swelling, pain, limping or strain/sprain.  Skin: Denies pruritus, rash, hives, warts, acne, eczema or change in skin lesion(s). Neuro:  No weakness, tremor, incoordination, spasms, paresthesia or pain. Psychiatric: Denies confusion, memory loss or sensory loss. Endo: Denies change in weight, skin or hair change.  Heme/Lymph: No excessive bleeding, bruising or enlarged lymph nodes.  Exam:  BP 146/84  Pulse 64  Temp 98.1 F   Resp 16  Ht 5' 11.5"   Wt 283 lb 3.2 oz   BMI 38.95 kg/m2  Appears well nourished - in no distress. Eyes: PERRLA, EOMs,  conjunctiva no swelling or erythema. Sinuses: No frontal/maxillary tenderness ENT/Mouth: EAC's clear, TM's nl w/o erythema, bulging. Nares clear w/o erythema, swelling, exudates. Oropharynx clear without erythema or exudates. Oral hygiene is good. Tongue normal, non obstructing. Hearing intact.  Neck: Supple. Thyroid nl. Car 2+/2+ without bruits, nodes or JVD. Chest: Respirations nl with BS clear & equal w/o rales, rhonchi, wheezing or stridor.  Cor: Heart sounds normal w/ regular rate and rhythm without sig. murmurs, gallops, clicks, or rubs. Peripheral pulses normal and equal  without edema.  Abdomen: Soft & bowel sounds normal. Non-tender w/o guarding, rebound, hernias, masses, or organomegaly.  Lymphatics: Unremarkable.  Musculoskeletal: Full ROM all peripheral extremities, joint stability, 5/5 strength, and normal gait.  Skin: Warm, dry without exposed rashes, lesions or ecchymosis apparent.  Neuro: Cranial nerves intact, reflexes equal bilaterally. Sensory-motor testing grossly intact. Tendon reflexes grossly intact.  Pysch: Alert & oriented x 3. Insight and judgement nl & appropriate. No ideations.  Assessment and Plan:  1. Hypertension - Continue monitor blood pressure at home. Continue diet/meds same.  2. Hyperlipidemia - Continue diet/meds, exercise,& lifestyle modifications. Continue monitor periodic cholesterol/liver & renal functions   3. Pre-diabetes - Continue diet, exercise, lifestyle modifications. Monitor appropriate labs.  4. Vitamin D Deficiency - Continue supplementation.  5. Obesity (BMI 39)  6. Testosterone Deficiency  Recommended regular exercise, BP monitoring, weight control, and discussed med and SE's. Recommended labs to assess and monitor clinical status. Further disposition pending results of labs.

## 2013-10-01 NOTE — Patient Instructions (Signed)

## 2013-10-02 LAB — VITAMIN D 25 HYDROXY (VIT D DEFICIENCY, FRACTURES): Vit D, 25-Hydroxy: 55 ng/mL (ref 30–89)

## 2013-10-02 LAB — INSULIN, FASTING: Insulin fasting, serum: 263 u[IU]/mL — ABNORMAL HIGH (ref 3–28)

## 2013-10-16 ENCOUNTER — Other Ambulatory Visit: Payer: Self-pay | Admitting: Internal Medicine

## 2013-11-07 ENCOUNTER — Ambulatory Visit (INDEPENDENT_AMBULATORY_CARE_PROVIDER_SITE_OTHER): Payer: BC Managed Care – PPO

## 2013-11-07 DIAGNOSIS — E291 Testicular hypofunction: Secondary | ICD-10-CM

## 2013-11-07 MED ORDER — TESTOSTERONE CYPIONATE 200 MG/ML IM SOLN
400.0000 mg | Freq: Once | INTRAMUSCULAR | Status: AC
  Administered 2013-11-07: 400 mg via INTRAMUSCULAR

## 2013-11-07 NOTE — Progress Notes (Signed)
Patient ID: Paul Warner, male   DOB: [birth year], 53 y.o.   MRN: [id number] Patient here today for testosterone injection. Received 2.0 ml IM right glut. Patient tolerated well.

## 2013-11-28 ENCOUNTER — Ambulatory Visit: Payer: Self-pay

## 2014-01-02 ENCOUNTER — Encounter: Payer: Self-pay | Admitting: Internal Medicine

## 2014-01-02 ENCOUNTER — Ambulatory Visit (INDEPENDENT_AMBULATORY_CARE_PROVIDER_SITE_OTHER): Payer: BC Managed Care – PPO | Admitting: Internal Medicine

## 2014-01-02 VITALS — BP 136/88 | HR 92 | Temp 98.1°F | Resp 18 | Ht 71.0 in | Wt 262.0 lb

## 2014-01-02 DIAGNOSIS — Z0000 Encounter for general adult medical examination without abnormal findings: Secondary | ICD-10-CM

## 2014-01-02 DIAGNOSIS — E559 Vitamin D deficiency, unspecified: Secondary | ICD-10-CM

## 2014-01-02 DIAGNOSIS — R740 Nonspecific elevation of levels of transaminase and lactic acid dehydrogenase [LDH]: Secondary | ICD-10-CM

## 2014-01-02 DIAGNOSIS — Z111 Encounter for screening for respiratory tuberculosis: Secondary | ICD-10-CM

## 2014-01-02 DIAGNOSIS — Z113 Encounter for screening for infections with a predominantly sexual mode of transmission: Secondary | ICD-10-CM

## 2014-01-02 DIAGNOSIS — Z1212 Encounter for screening for malignant neoplasm of rectum: Secondary | ICD-10-CM

## 2014-01-02 DIAGNOSIS — I10 Essential (primary) hypertension: Secondary | ICD-10-CM

## 2014-01-02 DIAGNOSIS — Z125 Encounter for screening for malignant neoplasm of prostate: Secondary | ICD-10-CM

## 2014-01-02 DIAGNOSIS — E291 Testicular hypofunction: Secondary | ICD-10-CM

## 2014-01-02 DIAGNOSIS — Z79899 Other long term (current) drug therapy: Secondary | ICD-10-CM

## 2014-01-02 DIAGNOSIS — R7402 Elevation of levels of lactic acid dehydrogenase (LDH): Secondary | ICD-10-CM

## 2014-01-02 DIAGNOSIS — R7401 Elevation of levels of liver transaminase levels: Secondary | ICD-10-CM

## 2014-01-02 LAB — CBC WITH DIFFERENTIAL/PLATELET
Basophils Absolute: 0 10*3/uL (ref 0.0–0.1)
Basophils Relative: 0 % (ref 0–1)
Eosinophils Absolute: 0.2 10*3/uL (ref 0.0–0.7)
Eosinophils Relative: 3 % (ref 0–5)
HCT: 48.3 % (ref 39.0–52.0)
Hemoglobin: 17.2 g/dL — ABNORMAL HIGH (ref 13.0–17.0)
Lymphocytes Relative: 29 % (ref 12–46)
Lymphs Abs: 2 10*3/uL (ref 0.7–4.0)
MCH: 31.6 pg (ref 26.0–34.0)
MCHC: 35.6 g/dL (ref 30.0–36.0)
MCV: 88.6 fL (ref 78.0–100.0)
Monocytes Absolute: 0.4 10*3/uL (ref 0.1–1.0)
Monocytes Relative: 6 % (ref 3–12)
Neutro Abs: 4.3 10*3/uL (ref 1.7–7.7)
Neutrophils Relative %: 62 % (ref 43–77)
Platelets: 229 10*3/uL (ref 150–400)
RBC: 5.45 MIL/uL (ref 4.22–5.81)
RDW: 12.9 % (ref 11.5–15.5)
WBC: 7 10*3/uL (ref 4.0–10.5)

## 2014-01-02 MED ORDER — TESTOSTERONE CYPIONATE 200 MG/ML IM SOLN
400.0000 mg | Freq: Once | INTRAMUSCULAR | Status: AC
  Administered 2014-01-02: 400 mg via INTRAMUSCULAR

## 2014-01-02 NOTE — Patient Instructions (Signed)

## 2014-01-02 NOTE — Progress Notes (Signed)
Patient ID: Paul Warner, male   DOB: Jul 03, 1960, 53 y.o.   MRN: [id number]   Annual Screening Comprehensive Examination  This very nice 53 y.o.MWM presents for complete physical.  Patient has been followed for HTN, Morbid Obesity,  Prediabetes, Hyperlipidemia, Testosterone and Vitamin D Deficiency.   HTN predates since 2001. Patient's BP has been controlled at home.Today's BP: 136/88 mmHg. Patient denies any cardiac symptoms as chest pain, palpitations, shortness of breath, dizziness or ankle swelling.   Patient's hyperlipidemia is not controlled with diet. Last lipids were 10/01/2013: Cholesterol, Total 185; HDL  40; LDL 113; Triglycerides 159.   Patient has Morbid Obedity (BMI 36.6) and consequent prediabetes since Sept 2009 with A1c 5.6% and elevated Insulin 263 and A1c 6.3% in Dec 2013.  He is attempting to manage this with diet and has lost 26# over the last year (on Phentermine).  Patient denies reactive hypoglycemic symptoms, visual blurring, diabetic polys or paresthesias. Last A1c was 10/01/2013: Hemoglobin-A1c 6.2%.   Finally, patient has history of Vitamin D Deficiency of 20 in 2008 and last vitamin D was 10/01/2013: Vit D, 25-Hydroxy 55  Medication Sig  . aspirin 81 MG tablet Take 81 mg by mouth daily. PRN  . bisoprolol-hctz 5-6.25 MG per tablet Take 1 tablet by mouth daily.  . cyclobenzaprine  10 MG tablet Take 10 mg by mouth at bedtime as needed  . VITAMIN D2 50,000 UNITS cap Take 1 capsule daily  . pantoprazole  40 MG tablet TAKE 1 TABLET BY MOUTH TWICE A DAY  . phentermine  37.5 MG tablet Take 1/2 to 1 tablet every morning for weight loss  .  (DEPOTESTOTERONE  200 MG/ML  Inject 2 mLs  into the muscle every 14 days.   Allergies  Allergen Reactions  . Codeine Nausea And Vomiting  . Nsaids Other (See Comments)    Bleeding ulcer 2013   Past Medical History  Diagnosis Date  . Blood transfusion 09/2011    2 units  . Hypertension   . Renal disorder   . Hyperlipidemia   .  Stomach ulcer   . Allergy     seasonal   Past Surgical History  Procedure Laterality Date  . Fracture surgery  broken collar bone  . Esophagogastroduodenoscopy  10/26/2011    Procedure: ESOPHAGOGASTRODUODENOSCOPY (EGD);  Surgeon: Irene Shipper, MD;  Location: [facility] ENDOSCOPY;  Service: Endoscopy;  Laterality: N/A;   Family History  Problem Relation Age of Onset  . Adopted: Yes  . Breast cancer Mother    History   Social History  . Marital Status: Married    Spouse Name: N/A    Number of Children: N/A  . Years of Education: N/A   Occupational History  . 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 Smoking status: Never Smoker   . Smokeless tobacco: Never Used  . Alcohol Use: 0.6 oz/week    1 Cans of beer per week     Comment: occasionally   . Drug Use: No  . Sexual Activity: Not on file    ROS Constitutional: Denies fever, chills, weight loss/gain, headaches, insomnia, fatigue, night sweats or change in appetite. Eyes: Denies redness, blurred vision, diplopia, discharge, itchy or watery eyes.  ENT: Denies discharge, congestion, post nasal drip, epistaxis, sore throat, earache, hearing loss, dental pain, Tinnitus, Vertigo, Sinus pain or snoring.  Cardio: Denies chest pain, palpitations, irregular heartbeat, syncope, dyspnea, diaphoresis, orthopnea, PND, claudication or edema Respiratory: denies cough, dyspnea,  DOE, pleurisy, hoarseness, laryngitis or wheezing.  Gastrointestinal: Denies dysphagia, heartburn, reflux, water brash, pain, cramps, nausea, vomiting, bloating, diarrhea, constipation, hematemesis, melena, hematochezia, jaundice or hemorrhoids Genitourinary: Denies dysuria, frequency, urgency, nocturia, hesitancy, discharge, hematuria or flank pain Musculoskeletal: Denies arthralgia, myalgia, stiffness, Jt. Swelling, pain, limp or strain/sprain. Denies Falls. Skin: Denies puritis, rash, hives, warts, acne, eczema or change in skin  lesion Neuro: No weakness, tremor, incoordination, spasms, paresthesia or pain Psychiatric: Denies confusion, memory loss or sensory loss. Denies Depression. Endocrine: Denies change in weight, skin, hair change, nocturia, and paresthesia, diabetic polys, visual blurring or hyper / hypo glycemic episodes.  Heme/Lymph: No excessive bleeding, bruising or enlarged lymph nodes.  Physical Exam  BP 136/88  Pulse 92  Temp(Src) 98.1 F (36.7 C) (Temporal)  Resp 18  Ht 5\' 11"  (1.803 m)  Wt 262 lb (118.842 kg)  BMI 36.56 kg/m2  General Appearance: Well nourished, in no apparent distress. Eyes: PERRLA, EOMs, conjunctiva no swelling or erythema, normal fundi and vessels. Sinuses: No frontal/maxillary tenderness ENT/Mouth: EACs patent / TMs  nl. Nares clear without erythema, swelling, mucoid exudates. Oral hygiene is good. No erythema, swelling, or exudate. Tongue normal, non-obstructing. Tonsils not swollen or erythematous. Hearing normal.  Neck: Supple, thyroid normal. No bruits, nodes or JVD. Respiratory: Respiratory effort normal.  BS equal and clear bilateral without rales, rhonci, wheezing or stridor. Cardio: Heart sounds are normal with regular rate and rhythm and no murmurs, rubs or gallops. Peripheral pulses are normal and equal bilaterally without edema. No aortic or femoral bruits. Chest: symmetric with normal excursions and percussion.  Abdomen: Flat, soft, with bowl sounds. Nontender, no guarding, rebound, hernias, masses, or organomegaly.  Lymphatics: Non tender without lymphadenopathy.  Genitourinary: No hernias.Testes nl. DRE - prostate nl for age - smooth & firm w/o nodules. Musculoskeletal: Full ROM all peripheral extremities, joint stability, 5/5 strength, and normal gait. Skin: Warm and dry without rashes, lesions, cyanosis, clubbing or  ecchymosis.  Neuro: Cranial nerves intact, reflexes equal bilaterally. Normal muscle tone, no cerebellar symptoms. Sensation intact.  Pysch:  Awake and oriented X 3with normal affect, insight and judgment appropriate.  Assessment and Plan  1. Annual Screening Examination 2. Hypertension  3. Hyperlipidemia 4. Pre Diabetes 5. Vitamin D Deficiency 6. Testosterone Deficiency 7 . Morbid Obesity (BMI 36.6)  Continue prudent diet as discussed, weight control, BP monitoring, regular exercise, and medications as discussed.  Discussed med effects and SE's. Routine screening labs and tests as requested with regular follow-up as recommended.

## 2014-01-03 LAB — MICROALBUMIN / CREATININE URINE RATIO
Creatinine, Urine: 172.2 mg/dL
Microalb Creat Ratio: 7.3 mg/g (ref 0.0–30.0)
Microalb, Ur: 1.25 mg/dL (ref 0.00–1.89)

## 2014-01-03 LAB — URINALYSIS, MICROSCOPIC ONLY
Bacteria, UA: NONE SEEN
Casts: NONE SEEN
Crystals: NONE SEEN
Squamous Epithelial / LPF: NONE SEEN

## 2014-01-03 LAB — LIPID PANEL
Cholesterol: 185 mg/dL (ref 0–200)
HDL: 40 mg/dL (ref >39)
LDL Cholesterol: 116 mg/dL — ABNORMAL HIGH (ref 0–99)
Total CHOL/HDL Ratio: 4.6 Ratio
Triglycerides: 146 mg/dL (ref <150)
VLDL: 29 mg/dL (ref 0–40)

## 2014-01-03 LAB — HEMOGLOBIN A1C
Hgb A1c MFr Bld: 5.8 % — ABNORMAL HIGH (ref <5.7)
Mean Plasma Glucose: 120 mg/dL — ABNORMAL HIGH (ref <117)

## 2014-01-03 LAB — BASIC METABOLIC PANEL WITH GFR
BUN: 17 mg/dL (ref 6–23)
CO2: 24 mEq/L (ref 19–32)
Calcium: 10.1 mg/dL (ref 8.4–10.5)
Chloride: 100 mEq/L (ref 96–112)
Creat: 1.03 mg/dL (ref 0.50–1.35)
GFR, Est African American: >89 mL/min
GFR, Est Non African American: 83 mL/min
Glucose, Bld: 193 mg/dL — ABNORMAL HIGH (ref 70–99)
Potassium: 4.3 mEq/L (ref 3.5–5.3)
Sodium: 143 mEq/L (ref 135–145)

## 2014-01-03 LAB — TSH: TSH: 3.082 u[IU]/mL (ref 0.350–4.500)

## 2014-01-03 LAB — HIV ANTIBODY (ROUTINE TESTING W REFLEX): HIV 1&2 Ab, 4th Generation: NONREACTIVE

## 2014-01-03 LAB — HEPATIC FUNCTION PANEL
ALT: 42 U/L (ref 0–53)
AST: 29 U/L (ref 0–37)
Albumin: 4.6 g/dL (ref 3.5–5.2)
Alkaline Phosphatase: 66 U/L (ref 39–117)
Bilirubin, Direct: 0.2 mg/dL (ref 0.0–0.3)
Indirect Bilirubin: 0.8 mg/dL (ref 0.2–1.2)
Total Bilirubin: 1 mg/dL (ref 0.2–1.2)
Total Protein: 7.3 g/dL (ref 6.0–8.3)

## 2014-01-03 LAB — INSULIN, FASTING: Insulin fasting, serum: 291 u[IU]/mL — ABNORMAL HIGH (ref 3–28)

## 2014-01-03 LAB — VITAMIN B12: Vitamin B-12: 449 pg/mL (ref 211–911)

## 2014-01-03 LAB — MAGNESIUM: Magnesium: 1.9 mg/dL (ref 1.5–2.5)

## 2014-01-03 LAB — HEPATITIS B CORE ANTIBODY, TOTAL: Hep B Core Total Ab: NONREACTIVE

## 2014-01-03 LAB — TESTOSTERONE: Testosterone: 284 ng/dL — ABNORMAL LOW (ref 300–890)

## 2014-01-03 LAB — HEPATITIS B SURFACE ANTIBODY,QUALITATIVE: Hep B S Ab: NEGATIVE

## 2014-01-03 LAB — VITAMIN D 25 HYDROXY (VIT D DEFICIENCY, FRACTURES): Vit D, 25-Hydroxy: 87 ng/mL (ref 30–89)

## 2014-01-03 LAB — HEPATITIS A ANTIBODY, TOTAL: Hep A Total Ab: NONREACTIVE

## 2014-01-03 LAB — PSA: PSA: 0.33 ng/mL (ref <=4.00)

## 2014-01-03 LAB — HEPATITIS C ANTIBODY: HCV Ab: NEGATIVE

## 2014-01-06 LAB — HEPATITIS B E ANTIBODY: Hepatitis Be Antibody: NONREACTIVE

## 2014-01-28 ENCOUNTER — Ambulatory Visit (INDEPENDENT_AMBULATORY_CARE_PROVIDER_SITE_OTHER): Payer: BC Managed Care – PPO | Admitting: *Deleted

## 2014-01-28 DIAGNOSIS — E291 Testicular hypofunction: Secondary | ICD-10-CM

## 2014-01-28 MED ORDER — TESTOSTERONE CYPIONATE 200 MG/ML IM SOLN
400.0000 mg | Freq: Once | INTRAMUSCULAR | Status: AC
  Administered 2014-01-28: 400 mg via INTRAMUSCULAR

## 2014-01-28 NOTE — Progress Notes (Signed)
Patient ID: Paul Warner, male   DOB: 1960/12/22, 53 y.o.   MRN: [id number] Patient presents for testosterone injection for hypogonadism Tx.  Patient received 2 cc IM right glut.  Patient tolerated well.

## 2014-02-18 ENCOUNTER — Ambulatory Visit (INDEPENDENT_AMBULATORY_CARE_PROVIDER_SITE_OTHER): Payer: BC Managed Care – PPO | Admitting: *Deleted

## 2014-02-18 DIAGNOSIS — E291 Testicular hypofunction: Secondary | ICD-10-CM

## 2014-02-18 MED ORDER — TESTOSTERONE CYPIONATE 200 MG/ML IM SOLN
400.0000 mg | Freq: Once | INTRAMUSCULAR | Status: AC
  Administered 2014-02-18: 400 mg via INTRAMUSCULAR

## 2014-02-18 NOTE — Progress Notes (Signed)
Patient ID: Paul Warner, male   DOB: [birth year], 53 y.o.   MRN: [id number] Patient presents for testosterone injection for hypogonadism Tx.  Patient received 2 cc IM left glut.  Patient tolerated well.

## 2014-03-07 ENCOUNTER — Ambulatory Visit: Payer: Self-pay

## 2014-04-09 ENCOUNTER — Ambulatory Visit: Payer: Self-pay | Admitting: Physician Assistant

## 2014-04-22 ENCOUNTER — Encounter: Payer: Self-pay | Admitting: Physician Assistant

## 2014-04-22 ENCOUNTER — Ambulatory Visit (INDEPENDENT_AMBULATORY_CARE_PROVIDER_SITE_OTHER): Payer: BC Managed Care – PPO | Admitting: Physician Assistant

## 2014-04-22 VITALS — BP 158/98 | HR 74 | Temp 98.2°F | Resp 18 | Ht 71.5 in | Wt 264.0 lb

## 2014-04-22 DIAGNOSIS — E785 Hyperlipidemia, unspecified: Secondary | ICD-10-CM

## 2014-04-22 DIAGNOSIS — R7309 Other abnormal glucose: Secondary | ICD-10-CM

## 2014-04-22 DIAGNOSIS — E291 Testicular hypofunction: Secondary | ICD-10-CM

## 2014-04-22 DIAGNOSIS — I10 Essential (primary) hypertension: Secondary | ICD-10-CM

## 2014-04-22 DIAGNOSIS — E669 Obesity, unspecified: Secondary | ICD-10-CM

## 2014-04-22 DIAGNOSIS — R7303 Prediabetes: Secondary | ICD-10-CM

## 2014-04-22 DIAGNOSIS — E559 Vitamin D deficiency, unspecified: Secondary | ICD-10-CM

## 2014-04-22 DIAGNOSIS — E349 Endocrine disorder, unspecified: Secondary | ICD-10-CM

## 2014-04-22 DIAGNOSIS — Z79899 Other long term (current) drug therapy: Secondary | ICD-10-CM

## 2014-04-22 LAB — CBC WITH DIFFERENTIAL/PLATELET
Basophils Absolute: 0 10*3/uL (ref 0.0–0.1)
Basophils Relative: 0 % (ref 0–1)
Eosinophils Absolute: 0.2 10*3/uL (ref 0.0–0.7)
Eosinophils Relative: 3 % (ref 0–5)
HCT: 50.7 % (ref 39.0–52.0)
Hemoglobin: 17.8 g/dL — ABNORMAL HIGH (ref 13.0–17.0)
Lymphocytes Relative: 33 % (ref 12–46)
Lymphs Abs: 2.1 10*3/uL (ref 0.7–4.0)
MCH: 31.4 pg (ref 26.0–34.0)
MCHC: 35.1 g/dL (ref 30.0–36.0)
MCV: 89.6 fL (ref 78.0–100.0)
MPV: 9.6 fL (ref 9.4–12.4)
Monocytes Absolute: 0.5 10*3/uL (ref 0.1–1.0)
Monocytes Relative: 8 % (ref 3–12)
Neutro Abs: 3.6 10*3/uL (ref 1.7–7.7)
Neutrophils Relative %: 56 % (ref 43–77)
Platelets: 233 10*3/uL (ref 150–400)
RBC: 5.66 MIL/uL (ref 4.22–5.81)
RDW: 12.6 % (ref 11.5–15.5)
WBC: 6.4 10*3/uL (ref 4.0–10.5)

## 2014-04-22 LAB — LIPID PANEL
Cholesterol: 181 mg/dL (ref 0–200)
HDL: 47 mg/dL (ref >39)
LDL Cholesterol: 110 mg/dL — ABNORMAL HIGH (ref 0–99)
Total CHOL/HDL Ratio: 3.9 Ratio
Triglycerides: 119 mg/dL (ref <150)
VLDL: 24 mg/dL (ref 0–40)

## 2014-04-22 LAB — HEPATIC FUNCTION PANEL
ALT: 33 U/L (ref 0–53)
AST: 24 U/L (ref 0–37)
Albumin: 4.6 g/dL (ref 3.5–5.2)
Alkaline Phosphatase: 56 U/L (ref 39–117)
Bilirubin, Direct: 0.2 mg/dL (ref 0.0–0.3)
Indirect Bilirubin: 0.6 mg/dL (ref 0.2–1.2)
Total Bilirubin: 0.8 mg/dL (ref 0.2–1.2)
Total Protein: 7.4 g/dL (ref 6.0–8.3)

## 2014-04-22 LAB — BASIC METABOLIC PANEL WITH GFR
BUN: 15 mg/dL (ref 6–23)
CO2: 27 mEq/L (ref 19–32)
Calcium: 9.6 mg/dL (ref 8.4–10.5)
Chloride: 102 mEq/L (ref 96–112)
Creat: 1 mg/dL (ref 0.50–1.35)
GFR, Est African American: >89 mL/min
GFR, Est Non African American: 86 mL/min
Glucose, Bld: 125 mg/dL — ABNORMAL HIGH (ref 70–99)
Potassium: 4.5 mEq/L (ref 3.5–5.3)
Sodium: 139 mEq/L (ref 135–145)

## 2014-04-22 LAB — VITAMIN D 25 HYDROXY (VIT D DEFICIENCY, FRACTURES): Vit D, 25-Hydroxy: 73 ng/mL (ref 30–100)

## 2014-04-22 MED ORDER — PHENTERMINE HCL 37.5 MG PO TABS: ORAL_TABLET | ORAL | Status: DC

## 2014-04-22 MED ORDER — TESTOSTERONE CYPIONATE 200 MG/ML IM SOLN
400.0000 mg | Freq: Once | INTRAMUSCULAR | Status: AC
  Administered 2014-04-22: 400 mg via INTRAMUSCULAR

## 2014-04-22 MED ORDER — TESTOSTERONE CYPIONATE 200 MG/ML IM SOLN: 400.0000 mg | INTRAMUSCULAR | Status: DC

## 2014-04-22 NOTE — Patient Instructions (Signed)
GIVE PT FOOD CHOICE LISTS FOR MEAL PLANNING  1)The amount of food you eat is important  -Too much can increase glucose levels and cause you to gain weight (which can  also increase glucose levels.  -Too little can decrease glucose level to unsafe levels (<70) 2)Eat meals and snacks at the same time each day to help your diabetes medication help you.  If you eat at different times each day, then the medication will not be as effective. 3)Do NOT skip meals or eat meals later than usual.  If you skip meals, then your glucose level can go low (<70).  Eating meals later than usual will not help the medication work effectively. 4) Amount of carbohydrates (carbs) per meal  -Breakfast- 30-45 grams of carbs  -Lunch and Dinner- 45-60 grams of carbs  -Snacks- 15-30 grams of carbs 5)Low fat foods have no more than 3 grams of fat per serving.  -Saturated- look for less than 1 gram per serving  -Trans Fat- look for 0 grams per serving 6)Exercise at least 120 minutes per week  Exercise Benefits:   -Lower LDL (Bad cholesterol)   -Lower blood pressure   -Increase HDL (Good cholesterol)   -Strengthen heart, lungs and muscles   -Burn calories and relieve stress   -Sleep better and help you feel better overall  To lower your risk of heart disease, limit your intake of saturated fat and trans fat as much as possible. THE GOOD WHAT IT DOES WHERE IT'S FOUND  MONOUNSATURATED FAT Lowers LDL and maybe raises HDL cholesterol Canola oil, olives, olive oil, peanuts, peanut oil, avocados, nuts  POLYUNSATURATED FAT Lowers LDL cholesterol Corn, safflower, sunflower and soybean oils, nuts, seeds  OMEGA-3 FATTY ACIDS Lowers triglycerides (blood fats) and blood pressure Salmon, mackerel, herring, sardines, flax seed, flaxseed oil, walnuts, soybean oil  THE BAD WHAT IT DOES WHERE IT'S FOUND  SATURATED FAT Raises LDL (bad) cholesterol Butter, shortening, lard, red meat, cheese, whole milk, ice cream, coconut and palm oils    TRANS FAT Raises LDL cholesterol, lowers HDL (good) cholesterol Fried foods, some stick margarines, some cookies and crackers (look for hydrogenated fat on the ingredient list)  CHOLESTEROL FROM FOOD Too much may raise cholesterol levels Meat, poultry, seafood, eggs, milk, cheese, yogurt, butter   Plate Method (How much food of each food group) 1) Fill one half of your plate with nonstarchy vegetables: lettuce, broccoli, green beans, spinach, carrots or peppers. 2) Fill one quarter with protein: chicken, [place], fish, lean meat, eggs or tofu. 3) Fill one quarter with a nutritious carbohydrate food: brown rice, whole-wheat pasta, whole-wheat bread, peas, or corn.  Choose whole-wheat carbs for extra nutrition.  Controlling carbs helps you control your blood glucose. 4) Include a small piece of fruit at each meal, as well as 8 ounces of lowfat milk or yogurt. 5) Add 1-2 teaspoons of heart-healthy fat, such as olive or canola oil, trans fat-free margarine, avocado, nuts or seeds.  Metformin/Glucophage (Including extended release)  -Usually taken twice a day with breakfast and evening meal. -Effects- Decrease amount of glucose released from liver -Side Effects- Bloating, gas, diarrhea, upset stomach, loss of appetite (usually within first few weeks of starting) -Take with food to minimize symptoms. -Metformin is not likely to cause low blood sugar. -Metformin is avoided in patients with decreased kidney (Cr greater than 1.4 for females and 1.5 for males) or liver function due to lactic acidosis.  - Metformin NEEDS TO BE STOPPED if you are having any  imaging or surgical procedure that is using contrast dye.  We are starting you on Metformin to prevent or treat diabetes. Metformin does not cause low blood sugars. In order to create energy your cells need insulin and sugar but sometime your cells do not accept the insulin and this can cause increased sugars and decreased energy. The Metformin helps  your cells accept insulin and the sugar to give you more energy.   The two most common side effects are nausea and diarrhea, follow these rules to avoid it! You can take imodium per box instructions when starting metformin if needed.   Rules of metformin: 1) start out slow with only one pill daily. Our goal for you is 4 pills a day or 2000mg  total.  2) take with your largest meal. 3) Take with least amount of carbs.   Call if you have any problems.      Bad carbs also include fruit juice, alcohol, and sweet tea. These are empty calories that do not signal to your brain that you are full.   Please remember the good carbs are still carbs which convert into sugar. So please measure them out no more than 1/2-1 cup of rice, oatmeal, pasta, and beans  Veggies are however free foods! Pile them on.   Not all fruit is created equal. Please see the list below, the fruit at the bottom is higher in sugars than the fruit at the top. Please avoid all dried fruits.

## 2014-04-22 NOTE — Progress Notes (Addendum)
Assessment and Plan:  1. Essential hypertension Get BP machine and monitor blood pressure at home. Call in couple of weeks with numbers and might have to start on BP medication.  Reminder to go to the ER if any CP, SOB, nausea, dizziness, severe HA, changes vision/speech, left arm numbness and tingling, and jaw pain. - CBC with Differential - BASIC METABOLIC PANEL WITH GFR - Hepatic function panel  2. Testosterone deficiency -Continue Testosterone- shot given at visit today.  Check level at next appt.- testosterone cypionate (DEPOTESTOTERONE CYPIONATE) injection 400 mg; Inject 2 mLs (400 mg total) into the muscle once.  3. Hyperlipidemia  Continue diet and start exercising 120 minutes per week. Check cholesterol.  - Lipid panel  4. Prediabetes Continue diet and start exercising 120 minutes per week. Check A1C.   - Hemoglobin A1c - Insulin, fasting  5. Vitamin D deficiency Check level and continue medications.  - Vit D  25 hydroxy   6. Encounter for long-term (current) use of medications Monitor kidney and liver function. - CBC with Differential - BASIC METABOLIC PANEL WITH GFR - Hepatic function panel  7. Obesity -Start Phentermine 37.5 Tablet (1/2 to 1 tablet every morning).  Told pt we will continue for 3 months and then will have to take break.  Continue diet and meds as discussed. Further disposition pending results of labs. Discussed medication effects and SE's.  Pt agreed to treatment plan. Please keep follow up appt on 07/17/14 with Dr. Melford Aase.  HPI 53 y.o. male  presents for 3 month follow up with hypertension, hyperlipidemia, prediabetes, testosterone deficiency and vitamin D.  His blood pressure has not been controlled at home, today their BP is BP: (!) 158/98 mmHg.  BP was 150/90 at end of visit. He does not workout, but does heavy lifting at work. He denies chest pain, shortness of breath, dizziness.   He is not on cholesterol medication and denies myalgias. His  cholesterol is not at goal. The cholesterol last visit was:   Lab Results  Component Value Date   CHOL 185 01/02/2014   HDL 40 01/02/2014   LDLCALC 116* 01/02/2014   TRIG 146 01/02/2014   CHOLHDL 4.6 01/02/2014   He has been working on diet and not exercise for prediabetes, and denies increased appetite, polydipsia and polyuria. Last A1C in the office was:  Lab Results  Component Value Date   HGBA1C 5.8* 01/02/2014   Currently manages prediabetes with? Diet only Number of meals per day? 3  B- pop tart and sometimes banana  L- Ar[employer] sandwich and no fries and unsweet tea (states he will remove some of the bread on sandwich)  D- pinto beans, rice, and meat (chicken)  Snacks? Mixed nuts and fruit  Beverages? unsweet tea or water  Up to date Eye Exam? 6 months ago and normal  Patient is on Vitamin D supplement- 10,000 IU daily. Lab Results  Component Value Date   VD25OH 87 01/02/2014     Testosterone Deficiency- Patient states he notices a difference with testosterone replacement.  Denies excessive hair growth and voice change.  Will check level at next appt.  Given shot today at visit.  Current Medications:  Current Outpatient Prescriptions on File Prior to Visit  Medication Sig Dispense Refill  . aspirin 81 MG tablet Take 81 mg by mouth daily. PRN    . pantoprazole (PROTONIX) 40 MG tablet TAKE 1 TABLET BY MOUTH TWICE A DAY 60 tablet 1  . testosterone cypionate (DEPOTESTOTERONE CYPIONATE) 200 MG/ML  injection Inject 2 mLs (400 mg total) into the muscle every 14 (fourteen) days. 10 mL 5  . phentermine (ADIPEX-P) 37.5 MG tablet Take 1/2 to 1 tablet every morning for weight loss (Patient not taking: Reported on 04/22/2014) 30 tablet 3   No current facility-administered medications on file prior to visit.   Medical History:  Past Medical History  Diagnosis Date  . Blood transfusion 09/2011    2 units  . Hypertension   . Renal disorder   . Hyperlipidemia   . Stomach ulcer   .  Allergy     seasonal   Allergies:  Allergies  Allergen Reactions  . Codeine Nausea And Vomiting  . Nsaids Other (See Comments)    Bleeding ulcer 2013    Review of Systems: [X]  = complains of  [ ]  = denies General: Fatigue [ ]  Fever [ ]  Chills [ ]  Weakness [ ]   Insomnia [ ]  Eyes: Redness [ ]  Blurred vision [ ]  Diplopia [ ]   ENT: Congestion [ ]  Sinus Pain [ ]  Post Nasal Drip [ ]  Sore Throat [ ]  Earache [ ]   Cardiac: Chest pain/pressure [ ]  SOB [ ]  Orthopnea [ ]   Palpitations [ ]   Paroxysmal nocturnal dyspnea[ ]  Claudication [ ]  Edema [ ]   Pulmonary: Cough [ ]  Wheezing[ ]   SOB [ ]   Snoring [ ]   GI: Nausea [ ]  Vomiting[ ]  Dysphagia[ ]  Heartburn[ ]  Abdominal pain [ ]  Constipation [ ] ; Diarrhea [ ] ; BRBPR [ ]  Melena[ ]  GU: Hematuria[ ]  Dysuria [ ]  Nocturia[ ]  Urgency [ ]   Hesitancy [ ]  Discharge [ ]  Neuro: Headaches[ ]  Vertigo[ ]  Paresthesias[ ]  Spasm [ ]  Speech changes [ ]  Incoordination [ ]   Ortho: Arthritis [ ]  Joint pain [ ]  Muscle pain [ ]  Joint swelling [ ]  Back Pain [ ]  Skin:  Rash [ ]   Pruritis [ ]  Change in skin lesion [ ]   Psych: Depression[ ]  Anxiety[ ]  Confusion [ ]  Memory loss [ ]  Stress [ X] Heme/Lypmh: Bleeding [ ]  Bruising [ ]  Enlarged lymph nodes [ ]   Endocrine: Visual blurring [ ]  Paresthesia [ ]  Polyuria [ ]  Polydypsea [ ]    Heat/cold intolerance [ ]  Hypoglycemia [ ]   Family history- Review and unchanged Social history- Review and unchanged Physical Exam: BP 158/98 mmHg  Pulse 74  Temp(Src) 98.2 F (36.8 C) (Temporal)  Resp 18  Ht 5' 11.5" (1.816 m)  Wt 264 lb (119.75 kg)  BMI 36.31 kg/m2  SpO2 98% 150/90 at recheck at end of visit. Wt Readings from Last 3 Encounters:  04/22/14 264 lb (119.75 kg)  01/02/14 262 lb (118.842 kg)  10/01/13 283 lb 3.2 oz (128.459 kg)  Vitals Reviewed. General Appearance: Well nourished, in no apparent distress. Obese. Eyes: PERRLA, EOMs, conjunctiva no swelling or erythema Sinuses: No Frontal/maxillary tenderness ENT/Mouth: Ext  aud canals clear, TMs without erythema, bulging. Right turbinates mildly edematous and left turbinates normal.  No erythema, swelling, or exudate on post pharynx.  Tonsils not swollen or erythematous. Hearing normal.  Neck: Supple, thyroid normal.  Respiratory: Respiratory effort normal, CTAB.  No w/r/r or stridor.  Cardio: RRR with no MRGs. Brisk peripheral pulses without edema.  Abdomen: Soft, + normal BS.  Non tender, no guarding, rebound, hernias, masses. Lymphatics: Non tender without lymphadenopathy.  Musculoskeletal: Full ROM, 5/5 strength, normal gait.  Skin: Warm, dry without rashes, lesions, ecchymosis.  Neuro: Cranial nerves intact. Normal muscle tone, no cerebellar symptoms. Sensation intact.  Psych:  Awake and oriented X 3, normal affect, Insight and Judgment appropriate.    Kashara Blocher, Stephani Police, PA-C 8:43 AM [facility]

## 2014-04-23 ENCOUNTER — Encounter: Payer: Self-pay | Admitting: Physician Assistant

## 2014-04-23 ENCOUNTER — Other Ambulatory Visit: Payer: Self-pay | Admitting: Physician Assistant

## 2014-04-23 DIAGNOSIS — R7303 Prediabetes: Secondary | ICD-10-CM

## 2014-04-23 LAB — INSULIN, FASTING: Insulin fasting, serum: 61.1 u[IU]/mL — ABNORMAL HIGH (ref 2.0–19.6)

## 2014-04-23 LAB — HEMOGLOBIN A1C
Hgb A1c MFr Bld: 6.2 % — ABNORMAL HIGH (ref <5.7)
Mean Plasma Glucose: 131 mg/dL — ABNORMAL HIGH (ref <117)

## 2014-04-23 MED ORDER — METFORMIN HCL ER 500 MG PO TB24: ORAL_TABLET | ORAL | Status: DC

## 2014-06-01 ENCOUNTER — Other Ambulatory Visit: Payer: Self-pay | Admitting: Internal Medicine

## 2014-07-17 ENCOUNTER — Ambulatory Visit: Payer: Self-pay | Admitting: Internal Medicine

## 2014-07-24 ENCOUNTER — Encounter: Payer: Self-pay | Admitting: Physician Assistant

## 2014-07-24 ENCOUNTER — Ambulatory Visit (INDEPENDENT_AMBULATORY_CARE_PROVIDER_SITE_OTHER): Payer: BLUE CROSS/BLUE SHIELD | Admitting: Physician Assistant

## 2014-07-24 VITALS — BP 132/82 | HR 76 | Temp 98.2°F | Resp 16 | Ht 71.5 in | Wt 277.0 lb

## 2014-07-24 DIAGNOSIS — E559 Vitamin D deficiency, unspecified: Secondary | ICD-10-CM

## 2014-07-24 DIAGNOSIS — Z79899 Other long term (current) drug therapy: Secondary | ICD-10-CM

## 2014-07-24 DIAGNOSIS — R7303 Prediabetes: Secondary | ICD-10-CM

## 2014-07-24 DIAGNOSIS — I10 Essential (primary) hypertension: Secondary | ICD-10-CM

## 2014-07-24 DIAGNOSIS — E785 Hyperlipidemia, unspecified: Secondary | ICD-10-CM

## 2014-07-24 DIAGNOSIS — E349 Endocrine disorder, unspecified: Secondary | ICD-10-CM

## 2014-07-24 DIAGNOSIS — E669 Obesity, unspecified: Secondary | ICD-10-CM

## 2014-07-24 DIAGNOSIS — E291 Testicular hypofunction: Secondary | ICD-10-CM

## 2014-07-24 DIAGNOSIS — G4733 Obstructive sleep apnea (adult) (pediatric): Secondary | ICD-10-CM

## 2014-07-24 DIAGNOSIS — R7309 Other abnormal glucose: Secondary | ICD-10-CM

## 2014-07-24 LAB — CBC WITH DIFFERENTIAL/PLATELET
Basophils Absolute: 0.1 10*3/uL (ref 0.0–0.1)
Basophils Relative: 1 % (ref 0–1)
Eosinophils Absolute: 0.3 10*3/uL (ref 0.0–0.7)
Eosinophils Relative: 5 % (ref 0–5)
HCT: 48 % (ref 39.0–52.0)
Hemoglobin: 16.8 g/dL (ref 13.0–17.0)
Lymphocytes Relative: 36 % (ref 12–46)
Lymphs Abs: 2.2 10*3/uL (ref 0.7–4.0)
MCH: 32.2 pg (ref 26.0–34.0)
MCHC: 35 g/dL (ref 30.0–36.0)
MCV: 92 fL (ref 78.0–100.0)
MPV: 9.4 fL (ref 8.6–12.4)
Monocytes Absolute: 0.5 10*3/uL (ref 0.1–1.0)
Monocytes Relative: 8 % (ref 3–12)
Neutro Abs: 3 10*3/uL (ref 1.7–7.7)
Neutrophils Relative %: 50 % (ref 43–77)
Platelets: 224 10*3/uL (ref 150–400)
RBC: 5.22 MIL/uL (ref 4.22–5.81)
RDW: 13.2 % (ref 11.5–15.5)
WBC: 6 10*3/uL (ref 4.0–10.5)

## 2014-07-24 MED ORDER — PHENTERMINE HCL 37.5 MG PO TABS: ORAL_TABLET | ORAL | Status: DC

## 2014-07-24 MED ORDER — TESTOSTERONE CYPIONATE 200 MG/ML IM SOLN
400.0000 mg | Freq: Once | INTRAMUSCULAR | Status: AC
  Administered 2014-07-24: 400 mg via INTRAMUSCULAR

## 2014-07-24 MED ORDER — METFORMIN HCL ER 500 MG PO TB24: ORAL_TABLET | ORAL | Status: DC

## 2014-07-24 NOTE — Progress Notes (Signed)
Assessment and Plan:  Hypertension: Continue medication, monitor blood pressure at home. Continue DASH diet.  Reminder to go to the ER if any CP, SOB, nausea, dizziness, severe HA, changes vision/speech, left arm numbness and tingling, and jaw pain. Cholesterol: Continue diet and exercise. Check cholesterol.  Pre-diabetes/insulin resistance-Continue diet and exercise, add metformin. Check A1C Vitamin D Def- check level and continue medications.  Obesity with co morbidities- long discussion about weight loss, diet, and exercise, will start the patient on phentermine- hand out given and AE's discussed, will do close follow up.  Probable sleep apnea- get sleep study, weight loss advised.    Continue diet and meds as discussed. Further disposition pending results of labs.  HPI 54 y.o. male  presents for 3 month follow up with hypertension, hyperlipidemia, prediabetes and vitamin D.  His blood pressure has been controlled at home, today their BP is BP: 132/82 mmHg  He does not workout but does a lot of walked. He denies chest pain, shortness of breath, dizziness.  He is not on cholesterol medication and denies myalgias. His cholesterol is at goal. The cholesterol last visit was:   Lab Results  Component Value Date   CHOL 181 04/22/2014   HDL 47 04/22/2014   LDLCALC 110* 04/22/2014   TRIG 119 04/22/2014   CHOLHDL 3.9 04/22/2014    He has been working on diet and exercise for prediabetes, and denies paresthesia of the feet, polydipsia, polyuria and visual disturbances. Last A1C in the office was:  Lab Results  Component Value Date   HGBA1C 6.2* 04/22/2014   Patient is on Vitamin D supplement.   Lab Results  Component Value Date   VD25OH 73 04/22/2014    BMI is Body mass index is 38.1 kg/(m^2)., he is working on diet and exercise, he has stopped soda but he was off phentermine for 3 months and with the holidays and his brother passing he has not been eating well. . Wt Readings from Last 7  Encounters:  07/24/14 277 lb (125.646 kg)  04/22/14 264 lb (119.75 kg)  01/02/14 262 lb (118.842 kg)  10/01/13 283 lb 3.2 oz (128.459 kg)  12/06/11 272 lb (123.378 kg)  11/28/11 272 lb (123.378 kg)  10/26/11 277 lb 9 oz (125.9 kg)    Current Medications:  Current Outpatient Prescriptions on File Prior to Visit  Medication Sig Dispense Refill  . aspirin 81 MG tablet Take 81 mg by mouth daily. PRN    . bisoprolol-hydrochlorothiazide (ZIAC) 10-6.25 MG per tablet TAKE 1 TABLET BY MOUTH IN THE MORNING FOR BLOOD PRESSURE 90 tablet 2  . Cholecalciferol (VITAMIN D PO) Take 10,000 Units by mouth daily.    . metFORMIN (GLUCOPHAGE XR) 500 MG 24 hr tablet Start taking 1 tablet PO Qdaily with largest meal.  If tolerating once a day dosing, then increase to 1 tablet PO BID with largest meals. 60 tablet 2  . pantoprazole (PROTONIX) 40 MG tablet TAKE 1 TABLET BY MOUTH TWICE A DAY 60 tablet 1  . phentermine (ADIPEX-P) 37.5 MG tablet Take 1/2 to 1 tablet every morning for weight loss 30 tablet 3  . testosterone cypionate (DEPOTESTOTERONE CYPIONATE) 200 MG/ML injection Inject 2 mLs (400 mg total) into the muscle every 14 (fourteen) days. 10 mL 5  . testosterone cypionate (DEPOTESTOTERONE CYPIONATE) 200 MG/ML injection Inject 2 mLs (400 mg total) into the muscle every 14 (fourteen) days. 10 mL 0   No current facility-administered medications on file prior to visit.   Medical History:  Past Medical History  Diagnosis Date  . Blood transfusion 09/2011    2 units  . Hypertension   . Renal disorder   . Hyperlipidemia   . Stomach ulcer   . Allergy     seasonal   Allergies:  Allergies  Allergen Reactions  . Codeine Nausea And Vomiting  . Nsaids Other (See Comments)    Bleeding ulcer 2013     Review of Systems:  Review of Systems  Constitutional: Positive for malaise/fatigue. Negative for fever, chills, weight loss and diaphoresis.  HENT: Negative.   Respiratory: Negative.   Cardiovascular:  Negative.   Gastrointestinal: Negative.   Genitourinary: Negative.   Musculoskeletal: Negative.   Skin: Negative.   Neurological: Negative.  Negative for weakness.  Psychiatric/Behavioral: Negative.     Family history- Review and unchanged Social history- Review and unchanged Physical Exam: BP 132/82 mmHg  Pulse 76  Temp(Src) 98.2 F (36.8 C)  Resp 16  Ht 5' 11.5" (1.816 m)  Wt 277 lb (125.646 kg)  BMI 38.10 kg/m2 Wt Readings from Last 3 Encounters:  07/24/14 277 lb (125.646 kg)  04/22/14 264 lb (119.75 kg)  01/02/14 262 lb (118.842 kg)   General Appearance: Well nourished, in no apparent distress. Eyes: PERRLA, EOMs, conjunctiva no swelling or erythema Sinuses: No Frontal/maxillary tenderness ENT/Mouth: Ext aud canals clear, TMs without erythema, bulging. No erythema, swelling, or exudate on post pharynx.  Tonsils not swollen or erythematous. Hearing normal.  Neck: Supple, thyroid normal. Crowded mouth Respiratory: Respiratory effort normal, BS equal bilaterally without rales, rhonchi, wheezing or stridor.  Cardio: RRR with no MRGs. Brisk peripheral pulses without edema.  Abdomen: Soft, + BS, obese  Non tender, no guarding, rebound, hernias, masses. Lymphatics: Non tender without lymphadenopathy.  Musculoskeletal: Full ROM, 5/5 strength, Normal gait Skin: Warm, dry without rashes, lesions, ecchymosis.  Neuro: Cranial nerves intact. Normal muscle tone, no cerebellar symptoms. Psych: Awake and oriented X 3, normal affect, Insight and Judgment appropriate.    Vicie Mutters, PA-C 11:41 AM [facility]

## 2014-07-24 NOTE — Patient Instructions (Signed)
We are starting you on Metformin to prevent or treat diabetes. Metformin does not cause low blood sugars. In order to create energy your cells need insulin and sugar but sometime your cells do not accept the insulin and this can cause increased sugars and decreased energy. The Metformin helps your cells accept insulin and the sugar to give you more energy.   The two most common side effects are nausea and diarrhea, follow these rules to avoid it! You can take imodium per box instructions when starting metformin if needed.   Rules of metformin: 1) start out slow with only one pill daily. Our goal for you is 4 pills a day or 2000mg  total.  2) take with your largest meal. 3) Take with least amount of carbs.   Call if you have any problems.   Diabetes is a very complicated disease...lets simplify it.  An easy way to look at it to understand the complications is if you think of the extra sugar floating in your blood stream as glass shards floating through your blood stream.    Diabetes affects your small vessels first: 1) The glass shards (sugar) scraps down the tiny blood vessels in your eyes and lead to diabetic retinopathy, the leading cause of blindness in the [place]. Diabetes is the leading cause of newly diagnosed adult (47 to 54 years of age) blindness in the [place].  2) The glass shards scratches down the tiny vessels of your legs leading to nerve damage called neuropathy and can lead to amputations of your feet. More than 60% of all non-traumatic amputations of lower limbs occur in people with diabetes.  3) Over time the small vessels in your brain are shredded and closed off, individually this does not cause any problems but over a long period of time many of the small vessels being blocked can lead to Vascular Dementia.   4) Your kidney's are a filter system and have a "net" that keeps certain things in the body and lets bad things out. Sugar shreds this net and leads to kidney damage  and eventually failure. Decreasing the sugar that is destroying the net and certain blood pressure medications can help stop or decrease progression of kidney disease. Diabetes was the primary cause of kidney failure in 44 percent of all new cases in 2011.  5) Diabetes also destroys the small vessels in your penis that lead to erectile dysfunction. Eventually the vessels are so damaged that you may not be responsive to cialis or viagra.   Diabetes and your large vessels: Your larger vessels consist of your coronary arteries in your heart and the carotid vessels to your brain. Diabetes or even increased sugars put you at 300% increased risk of heart attack and stroke and this is why.. The sugar scrapes down your large blood vessels and your body sees this as an internal injury and tries to repair itself. Just like you get a scab on your skin, your platelets will stick to the blood vessel wall trying to heal it. This is why we have diabetics on low dose aspirin daily, this prevents the platelets from sticking and can prevent plaque formation. In addition, your body takes cholesterol and tries to shove it into the open wound. This is why we want your LDL, or bad cholesterol, below 70.   The combination of platelets and cholesterol over 5-10 years forms plaque that can break off and cause a heart attack or stroke.   PLEASE REMEMBER:  Diabetes is  preventable! Up to 15 percent of complications and morbidities among individuals with type 2 diabetes can be prevented, delayed, or effectively treated and minimized with regular visits to a health professional, appropriate monitoring and medication, and a healthy diet and lifestyle.  Phentermine  While taking the medication we may ask that you come into the office once a month or once every 2-3 months to monitor your weight, blood pressure, and heart rate. In addition we can help answer your questions about diet, exercise, and help you every step of the way with  your weight loss journey. Sometime it is helpful if you bring in a food diary or use an app on your phone such as myfitnesspal to record your calorie intake, especially in the beginning.   You can start out on 1/3 to 1/2 a pill in the morning and if you are tolerating it well you can increase to one pill daily. I also have some patients that take 1/3 or 1/2 at lunch to help prevent night time eating.  This medication is cheapest CASH pay at [facility] and you do NOT need a membership to get meds from there.    What is this medicine? PHENTERMINE (FEN ter meen) decreases your appetite. This medicine is intended to be used in addition to a healthy reduced calorie diet and exercise. The best results are achieved this way. This medicine is only indicated for short-term use. Eventually your weight loss may level out and the medication will no longer be needed.   How should I use this medicine? Take this medicine by mouth. Follow the directions on the prescription label. The tablets should stay in the bottle until immediately before you take your dose. Take your doses at regular intervals. Do not take your medicine more often than directed.  Overdosage: If you think you have taken too much of this medicine contact a poison control center or emergency room at once. NOTE: This medicine is only for you. Do not share this medicine with others.  What if I miss a dose? If you miss a dose, take it as soon as you can. If it is almost time for your next dose, take only that dose. Do not take double or extra doses. Do not increase or in any way change your dose without consulting your doctor.  What should I watch for while using this medicine? Notify your physician immediately if you become short of breath while doing your normal activities. Do not take this medicine within 6 hours of bedtime. It can keep you from getting to sleep. Avoid drinks that contain caffeine and try to stick to a regular bedtime  every night. Do not stand or sit up quickly, especially if you are an older patient. This reduces the risk of dizzy or fainting spells. Avoid alcoholic drinks.  What side effects may I notice from receiving this medicine? Side effects that you should report to your doctor or health care professional as soon as possible: -chest pain, palpitations -depression or severe changes in mood -increased blood pressure -irritability -nervousness or restlessness -severe dizziness -shortness of breath -problems urinating -unusual swelling of the legs -vomiting  Side effects that usually do not require medical attention (report to your doctor or health care professional if they continue or are bothersome): -blurred vision or other eye problems -changes in sexual ability or desire -constipation or diarrhea -difficulty sleeping -dry mouth or unpleasant taste -headache -nausea This list may not describe all possible side effects. Call your  doctor for medical advice about side effects. You may report side effects to FDA at 1-800-FDA-1088.  I think it is possible that you have sleep apnea. It can cause interrupted sleep, headaches, frequent awakenings, fatigue, dry mouth, fast/slow heart beats, memory issues, anxiety/depression, swelling, numbness tingling hands/feet, weight gain, shortness of breath, and the list goes on. Sleep apnea needs to be ruled out because if it is left untreated it does eventually lead to abnormal heart beats, lung failure or heart failure as well as increasing the risk of heart attack and stroke. There are masks you can wear OR a mouth piece that I can give you information about. Often times though people feel MUCH better after getting treatment.   Sleep Apnea  Sleep apnea is a sleep disorder characterized by abnormal pauses in breathing while you sleep. When your breathing pauses, the level of oxygen in your blood decreases. This causes you to move out of deep sleep and into  light sleep. As a result, your quality of sleep is poor, and the system that carries your blood throughout your body (cardiovascular system) experiences stress. If sleep apnea remains untreated, the following conditions can develop:  High blood pressure (hypertension).  Coronary artery disease.  Inability to achieve or maintain an erection (impotence).  Impairment of your thought process (cognitive dysfunction). There are three types of sleep apnea: 1. Obstructive sleep apnea--Pauses in breathing during sleep because of a blocked airway. 2. Central sleep apnea--Pauses in breathing during sleep because the area of the brain that controls your breathing does not send the correct signals to the muscles that control breathing. 3. Mixed sleep apnea--A combination of both obstructive and central sleep apnea.  RISK FACTORS The following risk factors can increase your risk of developing sleep apnea:  Being overweight.  Smoking.  Having narrow passages in your nose and throat.  Being of older age.  Being male.  Alcohol use.  Sedative and tranquilizer use.  Ethnicity. Among individuals younger than 35 years, African Americans are at increased risk of sleep apnea. SYMPTOMS   Difficulty staying asleep.  Daytime sleepiness and fatigue.  Loss of energy.  Irritability.  Loud, heavy snoring.  Morning headaches.  Trouble concentrating.  Forgetfulness.  Decreased interest in sex. DIAGNOSIS  In order to diagnose sleep apnea, your caregiver will perform a physical examination. Your caregiver may suggest that you take a home sleep test. Your caregiver may also recommend that you spend the night in a sleep lab. In the sleep lab, several monitors record information about your heart, lungs, and brain while you sleep. Your leg and arm movements and blood oxygen level are also recorded. TREATMENT The following actions may help to resolve mild sleep apnea:  Sleeping on your side.    Using a decongestant if you have nasal congestion.   Avoiding the use of depressants, including alcohol, sedatives, and narcotics.   Losing weight and modifying your diet if you are overweight. There also are devices and treatments to help open your airway:  Oral appliances. These are custom-made mouthpieces that shift your lower jaw forward and slightly open your bite. This opens your airway.  Devices that create positive airway pressure. This positive pressure "splints" your airway open to help you breathe better during sleep. The following devices create positive airway pressure:  Continuous positive airway pressure (CPAP) device. The CPAP device creates a continuous level of air pressure with an air pump. The air is delivered to your airway through a mask while you sleep.  This continuous pressure keeps your airway open.  Nasal expiratory positive airway pressure (EPAP) device. The EPAP device creates positive air pressure as you exhale. The device consists of single-use valves, which are inserted into each nostril and held in place by adhesive. The valves create very little resistance when you inhale but create much more resistance when you exhale. That increased resistance creates the positive airway pressure. This positive pressure while you exhale keeps your airway open, making it easier to breath when you inhale again.  Bilevel positive airway pressure (BPAP) device. The BPAP device is used mainly in patients with central sleep apnea. This device is similar to the CPAP device because it also uses an air pump to deliver continuous air pressure through a mask. However, with the BPAP machine, the pressure is set at two different levels. The pressure when you exhale is lower than the pressure when you inhale.  Surgery. Typically, surgery is only done if you cannot comply with less invasive treatments or if the less invasive treatments do not improve your condition. Surgery involves removing  excess tissue in your airway to create a wider passage way. Document Released: 05/06/2002 Document Revised: 09/10/2012 Document Reviewed: 09/22/2011 [facility] Patient Information 2015 [place], [place]. This information is not intended to replace advice given to you by your health care provider. Make sure you discuss any questions you have with your health care provider.

## 2014-07-25 LAB — LIPID PANEL
Cholesterol: 170 mg/dL (ref 0–200)
HDL: 38 mg/dL — ABNORMAL LOW (ref >=40)
LDL Cholesterol: 91 mg/dL (ref 0–99)
Total CHOL/HDL Ratio: 4.5 Ratio
Triglycerides: 206 mg/dL — ABNORMAL HIGH (ref <150)
VLDL: 41 mg/dL — ABNORMAL HIGH (ref 0–40)

## 2014-07-25 LAB — HEPATIC FUNCTION PANEL
ALT: 44 U/L (ref 0–53)
AST: 27 U/L (ref 0–37)
Albumin: 4.4 g/dL (ref 3.5–5.2)
Alkaline Phosphatase: 57 U/L (ref 39–117)
Bilirubin, Direct: 0.2 mg/dL (ref 0.0–0.3)
Indirect Bilirubin: 0.6 mg/dL (ref 0.2–1.2)
Total Bilirubin: 0.8 mg/dL (ref 0.2–1.2)
Total Protein: 7.4 g/dL (ref 6.0–8.3)

## 2014-07-25 LAB — BASIC METABOLIC PANEL WITH GFR
BUN: 15 mg/dL (ref 6–23)
CO2: 25 mEq/L (ref 19–32)
Calcium: 9.1 mg/dL (ref 8.4–10.5)
Chloride: 100 mEq/L (ref 96–112)
Creat: 0.94 mg/dL (ref 0.50–1.35)
GFR, Est African American: >89 mL/min
GFR, Est Non African American: >89 mL/min
Glucose, Bld: 139 mg/dL — ABNORMAL HIGH (ref 70–99)
Potassium: 3.8 mEq/L (ref 3.5–5.3)
Sodium: 135 mEq/L (ref 135–145)

## 2014-07-25 LAB — VITAMIN D 25 HYDROXY (VIT D DEFICIENCY, FRACTURES): Vit D, 25-Hydroxy: 54 ng/mL (ref 30–100)

## 2014-07-25 LAB — HEMOGLOBIN A1C
Hgb A1c MFr Bld: 5.9 % — ABNORMAL HIGH (ref <5.7)
Mean Plasma Glucose: 123 mg/dL — ABNORMAL HIGH (ref <117)

## 2014-07-25 LAB — MAGNESIUM: Magnesium: 1.9 mg/dL (ref 1.5–2.5)

## 2014-07-25 LAB — TSH: TSH: 2.236 u[IU]/mL (ref 0.350–4.500)

## 2014-07-25 LAB — INSULIN, FASTING: Insulin fasting, serum: 78 u[IU]/mL — ABNORMAL HIGH (ref 2.0–19.6)

## 2014-10-15 ENCOUNTER — Ambulatory Visit (INDEPENDENT_AMBULATORY_CARE_PROVIDER_SITE_OTHER): Payer: BLUE CROSS/BLUE SHIELD | Admitting: *Deleted

## 2014-10-15 DIAGNOSIS — E291 Testicular hypofunction: Secondary | ICD-10-CM

## 2014-10-15 MED ORDER — TESTOSTERONE CYPIONATE 200 MG/ML IM SOLN
400.0000 mg | Freq: Once | INTRAMUSCULAR | Status: AC
Start: 2014-10-15 — End: 2014-10-15
  Administered 2014-10-15: 400 mg via INTRAMUSCULAR

## 2014-10-15 NOTE — Progress Notes (Signed)
Patient ID: Paul Warner, male   DOB: [birth year], 54 y.o.   MRN: [id number] Patient presents for testosterone injection for hypogonadism Tx.  Patient received 2 cc IM left glute and tolerated well.

## 2014-10-28 ENCOUNTER — Ambulatory Visit: Payer: Self-pay | Admitting: Internal Medicine

## 2014-10-30 ENCOUNTER — Ambulatory Visit (INDEPENDENT_AMBULATORY_CARE_PROVIDER_SITE_OTHER): Payer: BLUE CROSS/BLUE SHIELD | Admitting: Internal Medicine

## 2014-10-30 ENCOUNTER — Encounter: Payer: Self-pay | Admitting: Internal Medicine

## 2014-10-30 VITALS — BP 158/90 | HR 76 | Temp 98.4°F | Resp 18 | Ht 71.5 in | Wt 275.0 lb

## 2014-10-30 DIAGNOSIS — Z79899 Other long term (current) drug therapy: Secondary | ICD-10-CM

## 2014-10-30 DIAGNOSIS — E559 Vitamin D deficiency, unspecified: Secondary | ICD-10-CM

## 2014-10-30 DIAGNOSIS — I10 Essential (primary) hypertension: Secondary | ICD-10-CM

## 2014-10-30 DIAGNOSIS — E291 Testicular hypofunction: Secondary | ICD-10-CM

## 2014-10-30 DIAGNOSIS — E785 Hyperlipidemia, unspecified: Secondary | ICD-10-CM

## 2014-10-30 DIAGNOSIS — R7303 Prediabetes: Secondary | ICD-10-CM

## 2014-10-30 DIAGNOSIS — R7309 Other abnormal glucose: Secondary | ICD-10-CM

## 2014-10-30 LAB — HEPATIC FUNCTION PANEL
ALT: 68 U/L — ABNORMAL HIGH (ref 0–53)
AST: 37 U/L (ref 0–37)
Albumin: 4.5 g/dL (ref 3.5–5.2)
Alkaline Phosphatase: 47 U/L (ref 39–117)
Bilirubin, Direct: 0.2 mg/dL (ref 0.0–0.3)
Indirect Bilirubin: 0.9 mg/dL (ref 0.2–1.2)
Total Bilirubin: 1.1 mg/dL (ref 0.2–1.2)
Total Protein: 7.3 g/dL (ref 6.0–8.3)

## 2014-10-30 LAB — LIPID PANEL
Cholesterol: 182 mg/dL (ref 0–200)
HDL: 38 mg/dL — ABNORMAL LOW (ref >=40)
LDL Cholesterol: 110 mg/dL — ABNORMAL HIGH (ref 0–99)
Total CHOL/HDL Ratio: 4.8 Ratio
Triglycerides: 170 mg/dL — ABNORMAL HIGH (ref <150)
VLDL: 34 mg/dL (ref 0–40)

## 2014-10-30 LAB — BASIC METABOLIC PANEL WITH GFR
BUN: 17 mg/dL (ref 6–23)
CO2: 28 mEq/L (ref 19–32)
Calcium: 9.5 mg/dL (ref 8.4–10.5)
Chloride: 101 mEq/L (ref 96–112)
Creat: 1.09 mg/dL (ref 0.50–1.35)
GFR, Est African American: 89 mL/min
GFR, Est Non African American: 77 mL/min
Glucose, Bld: 172 mg/dL — ABNORMAL HIGH (ref 70–99)
Potassium: 4.2 mEq/L (ref 3.5–5.3)
Sodium: 141 mEq/L (ref 135–145)

## 2014-10-30 LAB — MAGNESIUM: Magnesium: 1.9 mg/dL (ref 1.5–2.5)

## 2014-10-30 LAB — HEMOGLOBIN A1C
Hgb A1c MFr Bld: 5.8 % — ABNORMAL HIGH (ref <5.7)
Mean Plasma Glucose: 120 mg/dL — ABNORMAL HIGH (ref <117)

## 2014-10-30 MED ORDER — BISOPROLOL-HYDROCHLOROTHIAZIDE 10-6.25 MG PO TABS: ORAL_TABLET | ORAL | Status: DC

## 2014-10-30 MED ORDER — TESTOSTERONE CYPIONATE 200 MG/ML IM SOLN
400.0000 mg | Freq: Once | INTRAMUSCULAR | Status: AC
  Administered 2014-10-30: 400 mg via INTRAMUSCULAR

## 2014-10-30 MED ORDER — MELOXICAM 7.5 MG PO TABS: 7.5000 mg | ORAL_TABLET | Freq: Every day | ORAL | Status: DC

## 2014-10-30 NOTE — Progress Notes (Signed)
Patient ID: TALHA ISER, male   DOB: March 01, 1961, 54 y.o.   MRN: [id number]  Assessment and Plan:  Hypertension:  -restart ziac -Continue medication,  -monitor blood pressure at home.  -Continue DASH diet.   -Reminder to go to the ER if any CP, SOB, nausea, dizziness, severe HA, changes vision/speech, left arm numbness and tingling, and jaw pain.  Cholesterol: -Continue diet and exercise.  -Check cholesterol.   Pre-diabetes: -Continue diet and exercise.  -Check A1C  Vitamin D Def: -check level -continue medications.   ED -take BP meds -lose weight -viagra samples given -patient told to go to ER if erection >4 hrs and also if chest pain  Continue diet and meds as discussed. Further disposition pending results of labs.  HPI 54 y.o. male  presents for 3 month follow up with hypertension, hyperlipidemia, prediabetes and vitamin D.   His blood pressure has been controlled at home, today their BP is BP: (!) 158/90 mmHg.   He does workout. He denies chest pain, shortness of breath, dizziness.   He is not on cholesterol medication and denies myalgias. His cholesterol is at goal. The cholesterol last visit was:   Lab Results  Component Value Date   CHOL 170 07/24/2014   HDL 38* 07/24/2014   LDLCALC 91 07/24/2014   TRIG 206* 07/24/2014   CHOLHDL 4.5 07/24/2014     He has been working on diet and exercise for prediabetes, and denies foot ulcerations, hyperglycemia, hypoglycemia , increased appetite, nausea, paresthesia of the feet, polydipsia, polyuria, visual disturbances, vomiting and weight loss. Last A1C in the office was:  Lab Results  Component Value Date   HGBA1C 5.9* 07/24/2014    Patient is on Vitamin D supplement.  Lab Results  Component Value Date   VD25OH 24 07/24/2014     Patient reports that he has been cutting out sodas and teas.    He has been out of his ziac and has not been taking it like he should.  He does also report some erectile dysfunction.   He reports that he just started taking his BP meds again.  He was off of the medicine for at least a couple of weeks.     Current Medications:  Current Outpatient Prescriptions on File Prior to Visit  Medication Sig Dispense Refill  . aspirin 81 MG tablet Take 81 mg by mouth daily. PRN    . bisoprolol-hydrochlorothiazide (ZIAC) 10-6.25 MG per tablet TAKE 1 TABLET BY MOUTH IN THE MORNING FOR BLOOD PRESSURE 90 tablet 2  . Cholecalciferol (VITAMIN D PO) Take 10,000 Units by mouth daily.    . pantoprazole (PROTONIX) 40 MG tablet TAKE 1 TABLET BY MOUTH TWICE A DAY 60 tablet 1  . testosterone cypionate (DEPOTESTOTERONE CYPIONATE) 200 MG/ML injection Inject 2 mLs (400 mg total) into the muscle every 14 (fourteen) days. 10 mL 5  . metFORMIN (GLUCOPHAGE XR) 500 MG 24 hr tablet Start taking 1 tablet PO Qdaily with largest meal.  If tolerating once a day dosing, then increase to 1 tablet PO BID with largest meals. (Patient not taking: Reported on 10/30/2014) 60 tablet 2   No current facility-administered medications on file prior to visit.    Medical History:  Past Medical History  Diagnosis Date  . Blood transfusion 09/2011    2 units  . Hypertension   . Renal disorder   . Hyperlipidemia   . Stomach ulcer   . Allergy     seasonal    Allergies:  Allergies  Allergen Reactions  . Codeine Nausea And Vomiting  . Nsaids Other (See Comments)    Bleeding ulcer 2013     Review of Systems:  Review of Systems  Constitutional: Negative for fever, chills and malaise/fatigue.  HENT: Negative for congestion, ear pain and sore throat.   Eyes: Negative for blurred vision and double vision.  Respiratory: Negative for cough, shortness of breath and wheezing.   Cardiovascular: Negative for chest pain, palpitations and leg swelling.  Gastrointestinal: Negative for heartburn, nausea, vomiting, diarrhea, constipation and blood in stool.  Genitourinary: Negative for dysuria, urgency and frequency.   Musculoskeletal:       Heel pain, left foot.  Skin: Negative.   Neurological: Negative for dizziness, sensory change, loss of consciousness and headaches.  Psychiatric/Behavioral: Negative for depression. The patient is not nervous/anxious and does not have insomnia.     Family history- Review and unchanged  Social history- Review and unchanged  Physical Exam: BP 158/90 mmHg  Pulse 76  Temp(Src) 98.4 F (36.9 C) (Temporal)  Resp 18  Ht 5' 11.5" (1.816 m)  Wt 275 lb (124.739 kg)  BMI 37.82 kg/m2 Wt Readings from Last 3 Encounters:  10/30/14 275 lb (124.739 kg)  07/24/14 277 lb (125.646 kg)  04/22/14 264 lb (119.75 kg)    General Appearance: Well nourished well developed, in no apparent distress. Eyes: PERRLA, EOMs, conjunctiva no swelling or erythema ENT/Mouth: Ear canals normal without obstruction, swelling, erythma, discharge.  TMs normal bilaterally.  Oropharynx moist, clear, without exudate, or postoropharyngeal swelling. Neck: Supple, thyroid normal,no cervical adenopathy  Respiratory: Respiratory effort normal, Breath sounds clear A&P without rhonchi, wheeze, or rale.  No retractions, no accessory usage. Cardio: RRR with no MRGs. Brisk peripheral pulses without edema.  Abdomen: Soft, + BS,  Non tender, no guarding, rebound, hernias, masses. Musculoskeletal: Full ROM, 5/5 strength, Normal gait Skin: Warm, dry without rashes, lesions, ecchymosis.  Neuro: Awake and oriented X 3, Cranial nerves intact. Normal muscle tone, no cerebellar symptoms. Psych: Normal affect, Insight and Judgment appropriate.    FORCUCCI, Margretta Zamorano, PA-C 8:52 AM [facility]

## 2014-10-30 NOTE — Patient Instructions (Signed)

## 2014-10-31 LAB — CBC WITH DIFFERENTIAL/PLATELET
Basophils Absolute: 0 10*3/uL (ref 0.0–0.1)
Basophils Relative: 0 % (ref 0–1)
Eosinophils Absolute: 0.1 10*3/uL (ref 0.0–0.7)
Eosinophils Relative: 2 % (ref 0–5)
HCT: 53.8 % — ABNORMAL HIGH (ref 39.0–52.0)
Hemoglobin: 18.4 g/dL — ABNORMAL HIGH (ref 13.0–17.0)
Lymphocytes Relative: 28 % (ref 12–46)
Lymphs Abs: 1.9 10*3/uL (ref 0.7–4.0)
MCH: 32.2 pg (ref 26.0–34.0)
MCHC: 34.2 g/dL (ref 30.0–36.0)
MCV: 94.2 fL (ref 78.0–100.0)
MPV: 9.6 fL (ref 8.6–12.4)
Monocytes Absolute: 0.6 10*3/uL (ref 0.1–1.0)
Monocytes Relative: 9 % (ref 3–12)
Neutro Abs: 4.2 10*3/uL (ref 1.7–7.7)
Neutrophils Relative %: 61 % (ref 43–77)
Platelets: 204 10*3/uL (ref 150–400)
RBC: 5.71 MIL/uL (ref 4.22–5.81)
RDW: 13.6 % (ref 11.5–15.5)
WBC: 6.9 10*3/uL (ref 4.0–10.5)

## 2014-10-31 LAB — INSULIN, RANDOM: Insulin: 266.9 u[IU]/mL — ABNORMAL HIGH (ref 2.0–19.6)

## 2014-11-02 LAB — VITAMIN D 1,25 DIHYDROXY
Vitamin D 1, 25 (OH)2 Total: 46 pg/mL (ref 18–72)
Vitamin D2 1, 25 (OH)2: 12 pg/mL
Vitamin D3 1, 25 (OH)2: 34 pg/mL

## 2014-11-03 ENCOUNTER — Other Ambulatory Visit: Payer: Self-pay | Admitting: Internal Medicine

## 2014-11-18 ENCOUNTER — Ambulatory Visit (INDEPENDENT_AMBULATORY_CARE_PROVIDER_SITE_OTHER): Payer: BLUE CROSS/BLUE SHIELD

## 2014-11-18 DIAGNOSIS — E291 Testicular hypofunction: Secondary | ICD-10-CM

## 2014-11-18 MED ORDER — TESTOSTERONE CYPIONATE 200 MG/ML IM SOLN
400.0000 mg | Freq: Once | INTRAMUSCULAR | Status: AC
  Administered 2014-11-18: 400 mg via INTRAMUSCULAR

## 2014-11-18 NOTE — Progress Notes (Signed)
Patient ID: Paul Warner, male   DOB: 10/19/1960, 54 y.o.   MRN: [id number] Patient here today for testosterone injection. Patient received 2.0 mL IM left glut. Patient tolerated well.

## 2014-11-20 ENCOUNTER — Other Ambulatory Visit: Payer: Self-pay | Admitting: Internal Medicine

## 2014-11-29 ENCOUNTER — Other Ambulatory Visit: Payer: Self-pay | Admitting: Internal Medicine

## 2014-12-02 ENCOUNTER — Ambulatory Visit: Payer: Self-pay

## 2014-12-03 ENCOUNTER — Encounter: Payer: Self-pay | Admitting: Internal Medicine

## 2014-12-04 ENCOUNTER — Other Ambulatory Visit: Payer: Self-pay | Admitting: Internal Medicine

## 2014-12-04 MED ORDER — SILDENAFIL CITRATE 50 MG PO TABS: 50.0000 mg | ORAL_TABLET | ORAL | Status: DC | PRN

## 2014-12-05 ENCOUNTER — Other Ambulatory Visit: Payer: Self-pay | Admitting: Internal Medicine

## 2014-12-05 DIAGNOSIS — G479 Sleep disorder, unspecified: Secondary | ICD-10-CM

## 2014-12-08 NOTE — Progress Notes (Signed)
I Left a message for the  patient to call me with more information on this provider, I could not find Nonda Lou either.   Thank you, Leonie Douglas Referral Coordinator  [facility] [phone] ext. [phone] Fax [phone]

## 2014-12-09 ENCOUNTER — Ambulatory Visit (INDEPENDENT_AMBULATORY_CARE_PROVIDER_SITE_OTHER): Payer: BLUE CROSS/BLUE SHIELD

## 2014-12-09 DIAGNOSIS — E291 Testicular hypofunction: Secondary | ICD-10-CM

## 2014-12-09 MED ORDER — TESTOSTERONE CYPIONATE 200 MG/ML IM SOLN
400.0000 mg | Freq: Once | INTRAMUSCULAR | Status: AC
  Administered 2014-12-09: 400 mg via INTRAMUSCULAR

## 2014-12-09 NOTE — Progress Notes (Signed)
Patient ID: Paul Warner, male   DOB: 1961-04-10, 54 y.o.   MRN: [id number] Patient here today for testosterone injection. Patient received 2.0 mL IM right glut. Patient tolerated well.

## 2014-12-22 ENCOUNTER — Other Ambulatory Visit: Payer: Self-pay | Admitting: Internal Medicine

## 2014-12-22 ENCOUNTER — Encounter: Payer: Self-pay | Admitting: Internal Medicine

## 2014-12-22 DIAGNOSIS — G4730 Sleep apnea, unspecified: Secondary | ICD-10-CM

## 2014-12-23 ENCOUNTER — Ambulatory Visit (INDEPENDENT_AMBULATORY_CARE_PROVIDER_SITE_OTHER): Payer: BLUE CROSS/BLUE SHIELD | Admitting: *Deleted

## 2014-12-23 DIAGNOSIS — E291 Testicular hypofunction: Secondary | ICD-10-CM

## 2014-12-23 MED ORDER — TESTOSTERONE CYPIONATE 200 MG/ML IM SOLN
400.0000 mg | Freq: Once | INTRAMUSCULAR | Status: AC
  Administered 2014-12-23: 400 mg via INTRAMUSCULAR

## 2014-12-23 NOTE — Progress Notes (Signed)
Patient ID: Paul Warner, male   DOB: 1960-10-09, 54 y.o.   MRN: [id number] Patient presents for testosterone injection for hypogonadism Tx.  Patient received 2 cc IM left glute and tolerated well.

## 2015-01-06 ENCOUNTER — Encounter: Payer: Self-pay | Admitting: Internal Medicine

## 2015-01-13 ENCOUNTER — Ambulatory Visit: Payer: Self-pay

## 2015-01-18 ENCOUNTER — Other Ambulatory Visit: Payer: Self-pay | Admitting: Physician Assistant

## 2015-01-18 DIAGNOSIS — E349 Endocrine disorder, unspecified: Secondary | ICD-10-CM

## 2015-01-19 ENCOUNTER — Encounter: Payer: Self-pay | Admitting: Internal Medicine

## 2015-01-19 ENCOUNTER — Ambulatory Visit: Payer: Self-pay

## 2015-03-11 ENCOUNTER — Other Ambulatory Visit: Payer: Self-pay | Admitting: *Deleted

## 2015-03-11 ENCOUNTER — Ambulatory Visit (INDEPENDENT_AMBULATORY_CARE_PROVIDER_SITE_OTHER): Payer: BLUE CROSS/BLUE SHIELD | Admitting: Internal Medicine

## 2015-03-11 ENCOUNTER — Encounter: Payer: Self-pay | Admitting: Internal Medicine

## 2015-03-11 VITALS — BP 140/86 | HR 68 | Temp 97.9°F | Resp 16 | Ht 71.75 in | Wt 274.8 lb

## 2015-03-11 DIAGNOSIS — E785 Hyperlipidemia, unspecified: Secondary | ICD-10-CM

## 2015-03-11 DIAGNOSIS — Z111 Encounter for screening for respiratory tuberculosis: Secondary | ICD-10-CM

## 2015-03-11 DIAGNOSIS — E559 Vitamin D deficiency, unspecified: Secondary | ICD-10-CM

## 2015-03-11 DIAGNOSIS — E119 Type 2 diabetes mellitus without complications: Secondary | ICD-10-CM

## 2015-03-11 DIAGNOSIS — R7303 Prediabetes: Secondary | ICD-10-CM

## 2015-03-11 DIAGNOSIS — K219 Gastro-esophageal reflux disease without esophagitis: Secondary | ICD-10-CM

## 2015-03-11 DIAGNOSIS — I10 Essential (primary) hypertension: Secondary | ICD-10-CM

## 2015-03-11 DIAGNOSIS — Z79899 Other long term (current) drug therapy: Secondary | ICD-10-CM

## 2015-03-11 DIAGNOSIS — Z1212 Encounter for screening for malignant neoplasm of rectum: Secondary | ICD-10-CM

## 2015-03-11 DIAGNOSIS — M1000 Idiopathic gout, unspecified site: Secondary | ICD-10-CM

## 2015-03-11 DIAGNOSIS — Z0000 Encounter for general adult medical examination without abnormal findings: Secondary | ICD-10-CM

## 2015-03-11 DIAGNOSIS — R5383 Other fatigue: Secondary | ICD-10-CM

## 2015-03-11 DIAGNOSIS — Z0001 Encounter for general adult medical examination with abnormal findings: Secondary | ICD-10-CM

## 2015-03-11 DIAGNOSIS — E349 Endocrine disorder, unspecified: Secondary | ICD-10-CM

## 2015-03-11 DIAGNOSIS — Z125 Encounter for screening for malignant neoplasm of prostate: Secondary | ICD-10-CM

## 2015-03-11 LAB — CBC WITH DIFFERENTIAL/PLATELET
Basophils Absolute: 0 10*3/uL (ref 0.0–0.1)
Basophils Relative: 0 % (ref 0–1)
Eosinophils Absolute: 0.1 10*3/uL (ref 0.0–0.7)
Eosinophils Relative: 2 % (ref 0–5)
HCT: 48.9 % (ref 39.0–52.0)
Hemoglobin: 16.8 g/dL (ref 13.0–17.0)
Lymphocytes Relative: 32 % (ref 12–46)
Lymphs Abs: 2.3 10*3/uL (ref 0.7–4.0)
MCH: 31.6 pg (ref 26.0–34.0)
MCHC: 34.4 g/dL (ref 30.0–36.0)
MCV: 91.9 fL (ref 78.0–100.0)
MPV: 9.3 fL (ref 8.6–12.4)
Monocytes Absolute: 0.8 10*3/uL (ref 0.1–1.0)
Monocytes Relative: 11 % (ref 3–12)
Neutro Abs: 4 10*3/uL (ref 1.7–7.7)
Neutrophils Relative %: 55 % (ref 43–77)
Platelets: 219 10*3/uL (ref 150–400)
RBC: 5.32 MIL/uL (ref 4.22–5.81)
RDW: 12.9 % (ref 11.5–15.5)
WBC: 7.3 10*3/uL (ref 4.0–10.5)

## 2015-03-11 MED ORDER — METFORMIN HCL ER 500 MG PO TB24: ORAL_TABLET | ORAL | Status: DC

## 2015-03-11 NOTE — Patient Instructions (Signed)
Recommend Adult Low Dose Aspirin or   coated  Aspirin 81 mg daily   To reduce risk of Colon Cancer 20 %,   Skin Cancer 26 % ,   Melanoma 46%   and   Pancreatic cancer 60%   ++++++++++++++++++++++++++++++++++++++++++++++++++++++  Vitamin D goal   is between 70-100.   Please make sure that you are taking your Vitamin D as directed.   It is very important as a natural anti-inflammatory   helping hair, skin, and nails, as well as reducing stroke and heart attack risk.   It helps your bones and helps with mood.  It also decreases numerous cancer risks so please take it as directed.   Low Vit D is associated with a 200-300% higher risk for CANCER   and 200-300% higher risk for HEART   ATTACK  &  STROKE.   ......................................  It is also associated with higher death rate at younger ages,   autoimmune diseases like Rheumatoid arthritis, Lupus, Multiple Sclerosis.     Also many other serious conditions, like depression, Alzheimer's  Dementia, infertility, muscle aches, fatigue, fibromyalgia - just to name a few.  ++++++++++++++++++++++++++++++++++++++++++++++++  Recommend the book "The END of DIETING" by Dr Joel Fuhrman   & the book "The END of DIABETES " by Dr Joel Fuhrman  At [employer] - get book & Audio CD's     Being diabetic has a  300% increased risk for heart attack, stroke, cancer, and alzheimer- type vascular dementia. It is very important that you work harder with diet by avoiding all foods that are white. Avoid white rice (brown & wild rice is OK), white potatoes (sweetpotatoes in moderation is OK), White bread or wheat bread or anything made out of white flour like bagels, donuts, rolls, buns, biscuits, cakes, pastries, cookies, pizza crust, and pasta (made from white flour & egg whites) - vegetarian pasta or spinach or wheat pasta is OK. Multigrain breads like Arnold's or [employer], or multigrain sandwich thins or flatbreads.  Diet,  exercise and weight loss can reverse and cure diabetes in the early stages.  Diet, exercise and weight loss is very important in the control and prevention of complications of diabetes which affects every system in your body, ie. Brain - dementia/stroke, eyes - glaucoma/blindness, heart - heart attack/heart failure, kidneys - dialysis, stomach - gastric paralysis, intestines - malabsorption, nerves - severe painful neuritis, circulation - gangrene & loss of a leg(s), and finally cancer and Alzheimers.    I recommend avoid fried & greasy foods,  sweets/candy, white rice (brown or wild rice or Quinoa is OK), white potatoes (sweet potatoes are OK) - anything made from white flour - bagels, doughnuts, rolls, buns, biscuits,white and wheat breads, pizza crust and traditional pasta made of white flour & egg white(vegetarian pasta or spinach or wheat pasta is OK).  Multi-grain bread is OK - like multi-grain flat bread or sandwich thins. Avoid alcohol in excess. Exercise is also important.    Eat all the vegetables you want - avoid meat, especially red meat and dairy - especially cheese.  Cheese is the most concentrated form of trans-fats which is the worst thing to clog up our arteries. Veggie cheese is OK which can be found in the fresh produce section at [employer] or [employer] or [employer]  ++++++++++++++++++++++++++++++++++++++++++++++++++ DASH Eating Plan  DASH stands for "Dietary Approaches to Stop Hypertension."   The DASH eating plan is a healthy eating plan that has been shown to reduce high   blood pressure (hypertension). Additional health benefits [place] include reducing the risk of type 2 diabetes mellitus, heart disease, and stroke. The DASH eating plan Poet also help with weight loss.  WHAT DO I NEED TO KNOW ABOUT THE DASH EATING PLAN? For the DASH eating plan, you will follow these general guidelines:  Choose foods with a percent daily value for sodium of less than 5% (as listed on the food  label).  Use salt-free seasonings or herbs instead of table salt or sea salt.  Check with your health care provider or pharmacist before using salt substitutes.  Eat lower-sodium products, often labeled as "lower sodium" or "no salt added."  Eat fresh foods.  Eat more vegetables, fruits, and low-fat dairy products.    Choose whole grains. Look for the word "whole" as the first word in the ingredient list.  Choose fish   Limit sweets, desserts, sugars, and sugary drinks.  Choose heart-healthy fats.  Eat veggie cheese   Eat more home-cooked food and less restaurant, buffet, and fast food.  Limit fried foods.  Cook foods using methods other than frying.  Limit canned vegetables. If you do use them, rinse them well to decrease the sodium.  When eating at a restaurant, ask that your food be prepared with less salt, or no salt if possible.                      WHAT FOODS CAN I EAT?  Seek help from a dietitian for individual calorie needs. Grains Whole grain or whole wheat bread. Brown rice. Whole grain or whole wheat pasta. Quinoa, bulgur, and whole grain cereals. Low-sodium cereals. Corn or whole wheat flour tortillas. Whole grain cornbread. Whole grain crackers. Low-sodium crackers.  Vegetables Fresh or frozen vegetables (raw, steamed, roasted, or grilled). Low-sodium or reduced-sodium tomato and vegetable juices. Low-sodium or reduced-sodium tomato sauce and paste. Low-sodium or reduced-sodium canned vegetables.   Fruits All fresh, canned (in natural juice), or frozen fruits.  Meat and Other Protein Products  All fish and seafood.  Dried beans, peas, or lentils. Unsalted nuts and seeds. Unsalted canned beans. Dairy Low-fat dairy products, such as skim or 1% milk, 2% or reduced-fat cheeses, low-fat ricotta or cottage cheese, or plain low-fat yogurt. Low-sodium or reduced-sodium cheeses.  Fats and Oils Tub margarines without trans fats. Light or reduced-fat mayonnaise  and salad dressings (reduced sodium). Avocado. Safflower, olive, or canola oils. Natural peanut or almond butter.  Other Unsalted popcorn and pretzels. The items listed above [place] not be a complete list of recommended foods or beverages. Contact your dietitian for more options.  +++++++++++++++++++++++++++++++++++++++++++  WHAT FOODS ARE NOT RECOMMENDED?  Grains/ White flour or wheat flour  White bread. White pasta. White rice. Refined cornbread. Bagels and croissants. Crackers that contain trans fat.  Vegetables  Creamed or fried vegetables. Vegetables in a . Regular canned vegetables. Regular canned tomato sauce and paste. Regular tomato and vegetable juices.  Fruits Dried fruits. Canned fruit in light or heavy syrup. Fruit juice.  Meat and Other Protein Products Meat in general. Fatty cuts of meat. Ribs, chicken wings, bacon, sausage, bologna, salami, chitterlings, fatback, hot dogs, bratwurst, and packaged luncheon meats. Salted nuts and seeds. Canned beans with salt.  Dairy Whole or 2% milk, cream, half-and-half, and cream cheese. Whole-fat or sweetened yogurt. Full-fat cheeses or blue cheese. Nondairy creamers and whipped toppings. Processed cheese, cheese spreads, or cheese curds.  Condiments Onion and garlic salt, seasoned salt, table salt, and sea  salt. Canned and packaged gravies. Worcestershire sauce. Tartar sauce. Barbecue sauce. Teriyaki sauce. Soy sauce, including reduced sodium. Steak sauce. Fish sauce. Oyster sauce. Cocktail sauce. Horseradish. Ketchup and mustard. Meat flavorings and tenderizers. Bouillon cubes. Hot sauce. Tabasco sauce. Marinades. Taco seasonings. Relishes.  Fats and Oils Butter, stick margarine, lard, shortening, ghee, and bacon fat. Coconut, palm kernel, or palm oils. Regular salad dressings.  Pickles and olives. Salted popcorn and pretzels. The items listed above may not be a complete list of foods and beverages to avoid.   Preventive Care for  Adults  A healthy lifestyle and preventive care can promote health and wellness. Preventive health guidelines for men include the following key practices:  A routine yearly physical is a good way to check with your health care provider about your health and preventative screening. It is a chance to share any concerns and updates on your health and to receive a thorough exam.  Visit your dentist for a routine exam and preventative care every 6 months. Brush your teeth twice a day and floss once a day. Good oral hygiene prevents tooth decay and gum disease.  The frequency of eye exams is based on your age, health, family medical history, use of contact lenses, and other factors. Follow your health care provider's recommendations for frequency of eye exams.  Eat a healthy diet. Foods such as vegetables, fruits, whole grains, low-fat dairy products, and lean protein foods contain the nutrients you need without too many calories. Decrease your intake of foods high in solid fats, added sugars, and salt. Eat the right amount of calories for you.Get information about a proper diet from your health care provider, if necessary.  Regular physical exercise is one of the most important things you can do for your health. Most adults should get at least 150 minutes of moderate-intensity exercise (any activity that increases your heart rate and causes you to sweat) each week. In addition, most adults need muscle-strengthening exercises on 2 or more days a week.  Maintain a healthy weight. The body mass index (BMI) is a screening tool to identify possible weight problems. It provides an estimate of body fat based on height and weight. Your health care provider can find your BMI and can help you achieve or maintain a healthy weight.For adults 20 years and older:  A BMI below 18.5 is considered underweight.  A BMI of 18.5 to 24.9 is normal.  A BMI of 25 to 29.9 is considered overweight.  A BMI of 30 and above  is considered obese.  Maintain normal blood lipids and cholesterol levels by exercising and minimizing your intake of saturated fat. Eat a balanced diet with plenty of fruit and vegetables. Blood tests for lipids and cholesterol should begin at age 20 and be repeated every 5 years. If your lipid or cholesterol levels are high, you are over 50, or you are at high risk for heart disease, you may need your cholesterol levels checked more frequently.Ongoing high lipid and cholesterol levels should be treated with medicines if diet and exercise are not working.  If you smoke, find out from your health care provider how to quit. If you do not use tobacco, do not start.  Lung cancer screening is recommended for adults aged 55-80 years who are at high risk for developing lung cancer because of a history of smoking. A yearly low-dose CT scan of the lungs is recommended for people who have at least a 30-pack-year history of smoking and are a   current smoker or have quit within the past 15 years. A pack year of smoking is smoking an average of 1 pack of cigarettes a day for 1 year (for example: 1 pack a day for 30 years or 2 packs a day for 15 years). Yearly screening should continue until the smoker has stopped smoking for at least 15 years. Yearly screening should be stopped for people who develop a health problem that would prevent them from having lung cancer treatment.  If you choose to drink alcohol, do not have more than 2 drinks per day. One drink is considered to be 12 ounces (355 mL) of beer, 5 ounces (148 mL) of wine, or 1.5 ounces (44 mL) of liquor.  Avoid use of street drugs. Do not share needles with anyone. Ask for help if you need support or instructions about stopping the use of drugs.  High blood pressure causes heart disease and increases the risk of stroke. Your blood pressure should be checked at least every 1-2 years. Ongoing high blood pressure should be treated with medicines, if weight loss  and exercise are not effective.  If you are 51-87 years old, ask your health care provider if you should take aspirin to prevent heart disease.  Diabetes screening involves taking a blood sample to check your fasting blood sugar level. This should be done once every 3 years, after age 86, if you are within normal weight and without risk factors for diabetes. Testing should be considered at a younger age or be carried out more frequently if you are overweight and have at least 1 risk factor for diabetes.  Colorectal cancer can be detected and often prevented. Most routine colorectal cancer screening begins at the age of 48 and continues through age 72. However, your health care provider may recommend screening at an earlier age if you have risk factors for colon cancer. On a yearly basis, your health care provider may provide home test kits to check for hidden blood in the stool. Use of a small camera at the end of a tube to directly examine the colon (sigmoidoscopy or colonoscopy) can detect the earliest forms of colorectal cancer. Talk to your health care provider about this at age 61, when routine screening begins. Direct exam of the colon should be repeated every 5-10 years through age 9, unless early forms of precancerous polyps or small growths are found.   Talk with your health care provider about prostate cancer screening.  Testicular cancer screening isrecommended for adult males. Screening includes self-exam, a health care provider exam, and other screening tests. Consult with your health care provider about any symptoms you have or any concerns you have about testicular cancer.  Use sunscreen. Apply sunscreen liberally and repeatedly throughout the day. You should seek shade when your shadow is shorter than you. Protect yourself by wearing long sleeves, pants, a wide-brimmed hat, and sunglasses year round, whenever you are outdoors.  Once a month, do a whole-body skin exam, using a mirror to  look at the skin on your back. Tell your health care provider about new moles, moles that have irregular borders, moles that are larger than a pencil eraser, or moles that have changed in shape or color.  Stay current with required vaccines (immunizations).  Influenza vaccine. All adults should be immunized every year.  Tetanus, diphtheria, and acellular pertussis (Td, Tdap) vaccine. An adult who has not previously received Tdap or who does not know his vaccine status should receive 1 dose of  Tdap. This initial dose should be followed by tetanus and diphtheria toxoids (Td) booster doses every 10 years. Adults with an unknown or incomplete history of completing a 3-dose immunization series with Td-containing vaccines should begin or complete a primary immunization series including a Tdap dose. Adults should receive a Td booster every 10 years.  Varicella vaccine. An adult without evidence of immunity to varicella should receive 2 doses or a second dose if he has previously received 1 dose.  Human papillomavirus (HPV) vaccine. Males aged 59-21 years who have not received the vaccine previously should receive the 3-dose series. Males aged 22-26 years may be immunized. Immunization is recommended through the age of 38 years for any male who has sex with males and did not get any or all doses earlier. Immunization is recommended for any person with an immunocompromised condition through the age of 13 years if he did not get any or all doses earlier. During the 3-dose series, the second dose should be obtained 4-8 weeks after the first dose. The third dose should be obtained 24 weeks after the first dose and 16 weeks after the second dose.  Zoster vaccine. One dose is recommended for adults aged 3 years or older unless certain conditions are present.    PREVNAR  - Pneumococcal 13-valent conjugate (PCV13) vaccine. When indicated, a person who is uncertain of his immunization history and has no record of  immunization should receive the PCV13 vaccine. An adult aged 37 years or older who has certain medical conditions and has not been previously immunized should receive 1 dose of PCV13 vaccine. This PCV13 should be followed with a dose of pneumococcal polysaccharide (PPSV23) vaccine. The PPSV23 vaccine dose should be obtained at least 8 weeks after the dose of PCV13 vaccine. An adult aged 10 years or older who has certain medical conditions and previously received 1 or more doses of PPSV23 vaccine should receive 1 dose of PCV13. The PCV13 vaccine dose should be obtained 1 or more years after the last PPSV23 vaccine dose.    PNEUMOVAX - Pneumococcal polysaccharide (PPSV23) vaccine. When PCV13 is also indicated, PCV13 should be obtained first. All adults aged 54 years and older should be immunized. An adult younger than age 74 years who has certain medical conditions should be immunized. Any person who resides in a nursing home or long-term care facility should be immunized. An adult smoker should be immunized. People with an immunocompromised condition and certain other conditions should receive both PCV13 and PPSV23 vaccines. People with human immunodeficiency virus (HIV) infection should be immunized as soon as possible after diagnosis. Immunization during chemotherapy or radiation therapy should be avoided. Routine use of PPSV23 vaccine is not recommended for American Indians, [place] Natives, or people younger than 65 years unless there are medical conditions that require PPSV23 vaccine. When indicated, people who have unknown immunization and have no record of immunization should receive PPSV23 vaccine. One-time revaccination 5 years after the first dose of PPSV23 is recommended for people aged 19-64 years who have chronic kidney failure, nephrotic syndrome, asplenia, or immunocompromised conditions. People who received 1-2 doses of PPSV23 before age 95 years should receive another dose of PPSV23 vaccine at age  1 years or later if at least 5 years have passed since the previous dose. Doses of PPSV23 are not needed for people immunized with PPSV23 at or after age 35 years.    Hepatitis A vaccine. Adults who wish to be protected from this disease, have certain high-risk conditions,  work with hepatitis A-infected animals, work in hepatitis A research labs, or travel to or work in countries with a high rate of hepatitis A should be immunized. Adults who were previously unvaccinated and who anticipate close contact with an international adoptee during the first 60 days after arrival in the [place] from a country with a high rate of hepatitis A should be immunized.    Hepatitis B vaccine. Adults should be immunized if they wish to be protected from this disease, have certain high-risk conditions, may be exposed to blood or other infectious body fluids, are household contacts or sex partners of hepatitis B positive people, are clients or workers in certain care facilities, or travel to or work in countries with a high rate of hepatitis B.   Preventive Service / Frequency   Ages 34 to 11  Blood pressure check.  Lipid and cholesterol check  Lung cancer screening. / Every year if you are aged 39-80 years and have a 30-pack-year history of smoking and currently smoke or have quit within the past 15 years. Yearly screening is stopped once you have quit smoking for at least 15 years or develop a health problem that would prevent you from having lung cancer treatment.  Fecal occult blood test (FOBT) of stool. / Every year beginning at age 84 and continuing until age 48. You may not have to do this test if you get a colonoscopy every 10 years.  Flexible sigmoidoscopy** or colonoscopy.** / Every 5 years for a flexible sigmoidoscopy or every 10 years for a colonoscopy beginning at age 48 and continuing until age 35. Screening for abdominal aortic aneurysm (AAA)  by ultrasound is recommended for people who  have history of high blood pressure or who are current or former smokers.

## 2015-03-11 NOTE — Progress Notes (Signed)
Patient ID: Paul Warner, male   DOB: 16-Jan-1961, 54 y.o.   MRN: [id number]   Comprehensive Evaluation,  Examination & Managment     This very nice 54 y.o. MWM presents for  presents for a Wellness Visit & comprehensive evaluation and management of multiple medical co-morbidities.  Patient has been followed for HTN, T2_NIDDM, OSA, Hyperlipidemia, Testosterone and Vitamin D Deficiency. Patient has OSA and is on CPAP.      HTN predates since 2001. Patient's BP has been controlled at home.Today's BP: 140/86 mmHg. Patient denies any cardiac symptoms as chest pain, palpitations, shortness of breath, dizziness or ankle swelling.     Patient's hyperlipidemia is controlled with diet and medications. Patient denies myalgias or other medication SE's. Last lipids were not at goal with  Cholesterol 182; HDL 38*; elevated LDL 110*; Triglycerides 170 on 10/30/2014.     Patient has hx/o A1c 5.65 with elevated Insulin 263 in 2009,   A1c 5.8% in 2011,   A1c 6.2% in 2012 ,   A1c 6.4% in Dec 2013  and A1c 5.8% with elevated Insulin 291 in Aug 2015 and A1c 6.2% in Nov 2015 and in Feb 2016 he was started on Metformin for Insulin resistance and patient denies reactive hypoglycemic symptoms, visual blurring, diabetic polys or paresthesias. Last A1c was  5.8% on 10/30/2014.  Patient relates that he has been off of his Metformin x 1 month.      Patient has hx/o low T  (208 in 2013 off Tx) and has been on Testosterone parenteral therapy since 2011 which he takes sporadically. Today he relates his last injection was about 3 months ago.  Finally, patient has history of Vitamin D Deficiency of 20 in 2008 and last vitamin D was 54 on 07/24/2014.      Medication Sig  . aspirin 81 MG tablet Take  daily  . bisoprolol-hctz (ZIAC) 10-6.25 MG TAKE 1 TABIN THE MORNING FOR BLOOD PRESSURE  . VITAMIN D  Take 10,000 Units  daily.  . meloxicam  7.5 MG tablet TAKE 1 TAB DAILY.  Marland Kitchen DEPO-TESTOSTERONE  200 MG/ML inj INJECT 2CC IM EVERY 2 WEEKS   . metFORMIN - XR 500 MG  1 tablet PO BID with largest meals.  . pantoprazole  40 MG tablet TAKE 1 TAB TWICE A DAY  . sildenafil (VIAGRA) 50 MG tablet Take 1 tab as needed for erectile dysfunction.   Allergies  Allergen Reactions  . Codeine Nausea And Vomiting  . Nsaids Other (See Comments)    Bleeding ulcer 2013   Past Medical History  Diagnosis Date  . Blood transfusion 09/2011    2 units  . Hypertension   . Renal disorder   . Hyperlipidemia   . Stomach ulcer   . Allergy     seasonal   Health Maintenance  Topic Date Due  . FOOT EXAM  03/17/1971  . OPHTHALMOLOGY EXAM  03/17/1971  . COLONOSCOPY  12/06/2014  . INFLUENZA VACCINE  12/29/2014  . URINE MICROALBUMIN  01/03/2015  . HEMOGLOBIN A1C  05/01/2015  . PNEUMOCOCCAL POLYSACCHARIDE VACCINE (2) 10/04/2015  . TETANUS/TDAP  10/09/2021  . Hepatitis C Screening  Completed  . HIV Screening  Completed   Immunization History  Administered Date(s) Administered  . PPD Test 01/02/2014, 03/11/2015  . Pneumococcal Polysaccharide-23 10/04/2010  . Tdap 10/10/2011   Past Surgical History  Procedure Laterality Date  . Fracture surgery  broken collar bone  . Esophagogastroduodenoscopy  10/26/2011    Procedure: ESOPHAGOGASTRODUODENOSCOPY (EGD);  Surgeon: Irene Shipper, MD;  Location: [facility] ENDOSCOPY;  Service: Endoscopy;  Laterality: N/A;   Family History  Problem Relation Age of Onset  . Adopted: Yes  . Breast cancer Mother    Social History   Social History  . Marital Status: Married    Spouse Name: N/A  . Number of Children: N/A  . Years of Education: N/A   Occupational History  . Not on file.   Social History Main Topics  . Smoking status: Never Smoker   . Smokeless tobacco: Never Used  . Alcohol Use: 0.6 oz/week    1 Cans of beer per week     Comment: occasionally   . Drug Use: No  . Sexual Activity: Active, but c/o rapid detumescence      ROS Constitutional: Denies fever, chills, weight loss/gain, headaches,  insomnia,  night sweats or change in appetite. Does c/o fatigue. Eyes: Denies redness, blurred vision, diplopia, discharge, itchy or watery eyes.  ENT: Denies discharge, congestion, post nasal drip, epistaxis, sore throat, earache, hearing loss, dental pain, Tinnitus, Vertigo, Sinus pain or snoring.  Cardio: Denies chest pain, palpitations, irregular heartbeat, syncope, dyspnea, diaphoresis, orthopnea, PND, claudication or edema Respiratory: denies cough, dyspnea, DOE, pleurisy, hoarseness, laryngitis or wheezing.  Gastrointestinal: Denies dysphagia, heartburn, reflux, water brash, pain, cramps, nausea, vomiting, bloating, diarrhea, constipation, hematemesis, melena, hematochezia, jaundice or hemorrhoids Genitourinary: Denies dysuria, frequency, urgency, nocturia, hesitancy, discharge, hematuria or flank pain Musculoskeletal: Denies myalgia, stiffness, pain, limp or strain/sprain. Reports recent pains in his Rt 1st toe. Denies Falls. Skin: Denies puritis, rash, hives, warts, acne, eczema or change in skin lesion Neuro: No weakness, tremor, incoordination, spasms, paresthesia or pain Psychiatric: Denies confusion, memory loss or sensory loss. Denies Depression. Endocrine: Denies change in weight, skin, hair change, nocturia, and paresthesia, diabetic polys, visual blurring or hyper / hypo glycemic episodes.  Heme/Lymph: No excessive bleeding, bruising or enlarged lymph nodes.  Physical Exam  BP 140/86 mmHg  Pulse 68  Temp(Src) 97.9 F (36.6 C)  Resp 16  Ht 5' 11.75" (1.822 m)  Wt 274 lb 12.8 oz (124.648 kg)  BMI 37.55 kg/m2  General Appearance: Well nourished &  in no apparent distress. Eyes: PERRLA, EOMs, conjunctiva no swelling or erythema, normal fundi and vessels. Sinuses: No frontal/maxillary tenderness ENT/Mouth: EACs patent / TMs  nl. Nares clear without erythema, swelling, mucoid exudates. Oral hygiene is good. No erythema, swelling, or exudate. Tongue normal, non-obstructing.  Tonsils not swollen or erythematous. Hearing normal.  Neck: Supple, thyroid normal. No bruits, nodes or JVD. Respiratory: Respiratory effort normal.  BS equal and clear bilateral without rales, rhonci, wheezing or stridor. Cardio: Heart sounds are normal with regular rate and rhythm and no murmurs, rubs or gallops. Peripheral pulses are normal and equal bilaterally without edema. No aortic or femoral bruits. Chest: symmetric with normal excursions and percussion.  Abdomen: Flat, soft, with bowel sounds. Nontender, no guarding, rebound, hernias, masses, or organomegaly.  Lymphatics: Non tender without lymphadenopathy.  Genitourinary: No hernias.Testes nl. DRE - prostate nl for age - smooth & firm w/o nodules. Musculoskeletal: Full ROM all peripheral extremities, joint stability, 5/5 strength, and normal gait. Skin: Warm and dry without rashes, lesions, cyanosis, clubbing or  ecchymosis.  Neuro: Cranial nerves intact, reflexes equal bilaterally. Normal muscle tone, no cerebellar symptoms. Sensation intact.  Pysch: Alert and oriented X 3 with normal affect, insight and judgment appropriate.   Assessment and Plan  1. Encounter for general adult medical examination with abnormal findings  -  Microalbumin / creatinine urine ratio - EKG 12-Lead - [place], RETROPERITNL ABD,  LTD - POC Hemoccult Bld/Stl (3-Cd Home Screen); Future - Vitamin B12 - PSA - Testosterone - Iron and TIBC - Urinalysis, Routine w reflex microscopic  - CBC with Differential/Platelet - BASIC METABOLIC PANEL WITH GFR - Hepatic function panel - Magnesium - Lipid panel - TSH - Hemoglobin A1c - Insulin, random - Vit D  25 hydroxy (rt - Uric acid  2. Essential hypertension  - Microalbumin / creatinine urine ratio - EKG 12-Lead - [place], RETROPERITNL ABD,  LTD - TSH  3. T2_NIDDM ([place])  - Microalbumin / creatinine urine ratio - Hemoglobin A1c - Insulin, random  4. Hyperlipidemia  - Lipid panel  5. Vitamin D  deficiency  - Vit D  25 hydroxy (  6. Testosterone deficiency  - VIAGRA 50 MG tablet; ; Refill: 1 - Testosterone  7. Gastroesophageal reflux disease   8. Idiopathic gout, r/o  - Uric acid  9. Prostate cancer screening  - PSA  10. Screening for rectal cancer  - POC Hemoccult Bld/Stl   11. Screening examination for pulmonary tuberculosis  - PPD  12. Other fatigue  - Vitamin B12 - Testosterone - Iron and TIBC - TSH  13. Medication management  - Urinalysis, Routine w reflex microscopic  - CBC with Differential/Platelet - BASIC METABOLIC PANEL WITH GFR - Hepatic function panel - Magnesium   Continue prudent diet as discussed, weight control, BP monitoring, regular exercise, and medications as discussed.  Discussed med effects and SE's. Routine screening labs and tests as requested with regular follow-up as recommended.  Over 40 minutes of exam, counseling &  chart review was performed

## 2015-03-12 ENCOUNTER — Other Ambulatory Visit: Payer: Self-pay | Admitting: Internal Medicine

## 2015-03-12 DIAGNOSIS — M1000 Idiopathic gout, unspecified site: Secondary | ICD-10-CM

## 2015-03-12 LAB — IRON AND TIBC
%SAT: 67 % — ABNORMAL HIGH (ref 15–60)
Iron: 201 ug/dL — ABNORMAL HIGH (ref 50–180)
TIBC: 300 ug/dL (ref 250–425)
UIBC: 99 ug/dL — ABNORMAL LOW (ref 125–400)

## 2015-03-12 LAB — HEMOGLOBIN A1C
Hgb A1c MFr Bld: 6.7 % — ABNORMAL HIGH (ref <5.7)
Mean Plasma Glucose: 146 mg/dL — ABNORMAL HIGH (ref <117)

## 2015-03-12 LAB — BASIC METABOLIC PANEL WITH GFR
BUN: 17 mg/dL (ref 7–25)
CO2: 25 mmol/L (ref 20–31)
Calcium: 9.3 mg/dL (ref 8.6–10.3)
Chloride: 102 mmol/L (ref 98–110)
Creat: 1.03 mg/dL (ref 0.70–1.33)
GFR, Est African American: >89 mL/min (ref >=60)
GFR, Est Non African American: 83 mL/min (ref >=60)
Glucose, Bld: 121 mg/dL — ABNORMAL HIGH (ref 65–99)
Potassium: 4.4 mmol/L (ref 3.5–5.3)
Sodium: 140 mmol/L (ref 135–146)

## 2015-03-12 LAB — URINALYSIS, ROUTINE W REFLEX MICROSCOPIC
Bilirubin Urine: NEGATIVE
Glucose, UA: NEGATIVE
Hgb urine dipstick: NEGATIVE
Ketones, ur: NEGATIVE
Leukocytes, UA: NEGATIVE
Nitrite: NEGATIVE
Protein, ur: NEGATIVE
Specific Gravity, Urine: 1.023 (ref 1.001–1.035)
pH: 5.5 (ref 5.0–8.0)

## 2015-03-12 LAB — HEPATIC FUNCTION PANEL
ALT: 39 U/L (ref 9–46)
AST: 28 U/L (ref 10–35)
Albumin: 4.3 g/dL (ref 3.6–5.1)
Alkaline Phosphatase: 44 U/L (ref 40–115)
Bilirubin, Direct: 0.4 mg/dL — ABNORMAL HIGH (ref <=0.2)
Indirect Bilirubin: 1.4 mg/dL — ABNORMAL HIGH (ref 0.2–1.2)
Total Bilirubin: 1.8 mg/dL — ABNORMAL HIGH (ref 0.2–1.2)
Total Protein: 7.4 g/dL (ref 6.1–8.1)

## 2015-03-12 LAB — MICROALBUMIN / CREATININE URINE RATIO
Creatinine, Urine: 201.2 mg/dL
Microalb Creat Ratio: 4.5 mg/g (ref 0.0–30.0)
Microalb, Ur: 0.9 mg/dL (ref <2.0)

## 2015-03-12 LAB — LIPID PANEL
Cholesterol: 174 mg/dL (ref 125–200)
HDL: 53 mg/dL (ref >=40)
LDL Cholesterol: 95 mg/dL (ref <130)
Total CHOL/HDL Ratio: 3.3 Ratio (ref <=5.0)
Triglycerides: 132 mg/dL (ref <150)
VLDL: 26 mg/dL (ref <30)

## 2015-03-12 LAB — INSULIN, RANDOM: Insulin: 20.4 u[IU]/mL — ABNORMAL HIGH (ref 2.0–19.6)

## 2015-03-12 LAB — MAGNESIUM: Magnesium: 1.8 mg/dL (ref 1.5–2.5)

## 2015-03-12 LAB — URIC ACID: Uric Acid, Serum: 8.9 mg/dL — ABNORMAL HIGH (ref 4.0–7.8)

## 2015-03-12 LAB — PSA: PSA: 0.5 ng/mL (ref <=4.00)

## 2015-03-12 LAB — TESTOSTERONE: Testosterone: 282 ng/dL — ABNORMAL LOW (ref 300–890)

## 2015-03-12 LAB — VITAMIN B12: Vitamin B-12: 515 pg/mL (ref 211–911)

## 2015-03-12 LAB — TSH: TSH: 1.923 u[IU]/mL (ref 0.350–4.500)

## 2015-03-12 LAB — VITAMIN D 25 HYDROXY (VIT D DEFICIENCY, FRACTURES): Vit D, 25-Hydroxy: 45 ng/mL (ref 30–100)

## 2015-03-12 MED ORDER — ALLOPURINOL 300 MG PO TABS: ORAL_TABLET | ORAL | Status: DC

## 2015-03-13 ENCOUNTER — Other Ambulatory Visit: Payer: Self-pay

## 2015-03-13 DIAGNOSIS — M1000 Idiopathic gout, unspecified site: Secondary | ICD-10-CM

## 2015-03-13 MED ORDER — ALLOPURINOL 300 MG PO TABS: ORAL_TABLET | ORAL | Status: DC

## 2015-03-16 LAB — TB SKIN TEST
Induration: 0 mm
TB Skin Test: NEGATIVE

## 2015-04-13 ENCOUNTER — Other Ambulatory Visit: Payer: Self-pay | Admitting: *Deleted

## 2015-04-13 DIAGNOSIS — Z0001 Encounter for general adult medical examination with abnormal findings: Secondary | ICD-10-CM

## 2015-04-13 DIAGNOSIS — Z1212 Encounter for screening for malignant neoplasm of rectum: Secondary | ICD-10-CM

## 2015-04-13 LAB — POC HEMOCCULT BLD/STL (HOME/3-CARD/SCREEN)
Card #2 Fecal Occult Blod, POC: NEGATIVE
Card #3 Fecal Occult Blood, POC: NEGATIVE
Fecal Occult Blood, POC: NEGATIVE

## 2015-05-21 ENCOUNTER — Ambulatory Visit (INDEPENDENT_AMBULATORY_CARE_PROVIDER_SITE_OTHER): Payer: BLUE CROSS/BLUE SHIELD | Admitting: *Deleted

## 2015-05-21 DIAGNOSIS — E291 Testicular hypofunction: Secondary | ICD-10-CM

## 2015-05-21 MED ORDER — TESTOSTERONE CYPIONATE 200 MG/ML IM SOLN
400.0000 mg | Freq: Once | INTRAMUSCULAR | Status: AC
  Administered 2015-05-21: 400 mg via INTRAMUSCULAR

## 2015-05-21 NOTE — Progress Notes (Signed)
Patient ID: Paul Warner, male   DOB: [birth year], 54 y.o.   MRN: [id number] Patient presents for testosterone injection for hypogonadism Tx.  Patient received 2 cc IM left glute and tolerated well.

## 2015-06-25 ENCOUNTER — Ambulatory Visit: Payer: Self-pay | Admitting: Internal Medicine

## 2015-06-26 ENCOUNTER — Encounter: Payer: Self-pay | Admitting: Internal Medicine

## 2015-06-26 ENCOUNTER — Ambulatory Visit (INDEPENDENT_AMBULATORY_CARE_PROVIDER_SITE_OTHER): Payer: BLUE CROSS/BLUE SHIELD | Admitting: Internal Medicine

## 2015-06-26 VITALS — BP 158/96 | HR 90 | Temp 98.4°F | Resp 18 | Ht 71.75 in | Wt 284.0 lb

## 2015-06-26 DIAGNOSIS — Z79899 Other long term (current) drug therapy: Secondary | ICD-10-CM

## 2015-06-26 DIAGNOSIS — E785 Hyperlipidemia, unspecified: Secondary | ICD-10-CM | POA: Diagnosis not present

## 2015-06-26 DIAGNOSIS — E559 Vitamin D deficiency, unspecified: Secondary | ICD-10-CM

## 2015-06-26 DIAGNOSIS — I10 Essential (primary) hypertension: Secondary | ICD-10-CM | POA: Diagnosis not present

## 2015-06-26 DIAGNOSIS — E119 Type 2 diabetes mellitus without complications: Secondary | ICD-10-CM | POA: Diagnosis not present

## 2015-06-26 DIAGNOSIS — R7303 Prediabetes: Secondary | ICD-10-CM

## 2015-06-26 LAB — CBC WITH DIFFERENTIAL/PLATELET
Basophils Absolute: 0.1 10*3/uL (ref 0.0–0.1)
Basophils Relative: 1 % (ref 0–1)
Eosinophils Absolute: 0.3 10*3/uL (ref 0.0–0.7)
Eosinophils Relative: 5 % (ref 0–5)
HCT: 50 % (ref 39.0–52.0)
Hemoglobin: 17.5 g/dL — ABNORMAL HIGH (ref 13.0–17.0)
Lymphocytes Relative: 38 % (ref 12–46)
Lymphs Abs: 2.3 10*3/uL (ref 0.7–4.0)
MCH: 33 pg (ref 26.0–34.0)
MCHC: 35 g/dL (ref 30.0–36.0)
MCV: 94.3 fL (ref 78.0–100.0)
MPV: 9.3 fL (ref 8.6–12.4)
Monocytes Absolute: 0.5 10*3/uL (ref 0.1–1.0)
Monocytes Relative: 9 % (ref 3–12)
Neutro Abs: 2.9 10*3/uL (ref 1.7–7.7)
Neutrophils Relative %: 47 % (ref 43–77)
Platelets: 230 10*3/uL (ref 150–400)
RBC: 5.3 MIL/uL (ref 4.22–5.81)
RDW: 12.8 % (ref 11.5–15.5)
WBC: 6.1 10*3/uL (ref 4.0–10.5)

## 2015-06-26 LAB — BASIC METABOLIC PANEL WITH GFR
BUN: 13 mg/dL (ref 7–25)
CO2: 27 mmol/L (ref 20–31)
Calcium: 9.4 mg/dL (ref 8.6–10.3)
Chloride: 103 mmol/L (ref 98–110)
Creat: 0.99 mg/dL (ref 0.70–1.33)
GFR, Est African American: >89 mL/min (ref >=60)
GFR, Est Non African American: 86 mL/min (ref >=60)
Glucose, Bld: 177 mg/dL — ABNORMAL HIGH (ref 65–99)
Potassium: 4.8 mmol/L (ref 3.5–5.3)
Sodium: 140 mmol/L (ref 135–146)

## 2015-06-26 LAB — LIPID PANEL
Cholesterol: 193 mg/dL (ref 125–200)
HDL: 39 mg/dL — ABNORMAL LOW (ref >=40)
LDL Cholesterol: 125 mg/dL (ref <130)
Total CHOL/HDL Ratio: 4.9 Ratio (ref <=5.0)
Triglycerides: 147 mg/dL (ref <150)
VLDL: 29 mg/dL (ref <30)

## 2015-06-26 LAB — HEMOGLOBIN A1C
Hgb A1c MFr Bld: 6.2 % — ABNORMAL HIGH (ref <5.7)
Mean Plasma Glucose: 131 mg/dL — ABNORMAL HIGH (ref <117)

## 2015-06-26 LAB — HEPATIC FUNCTION PANEL
ALT: 50 U/L — ABNORMAL HIGH (ref 9–46)
AST: 35 U/L (ref 10–35)
Albumin: 4.2 g/dL (ref 3.6–5.1)
Alkaline Phosphatase: 47 U/L (ref 40–115)
Bilirubin, Direct: 0.2 mg/dL (ref <=0.2)
Indirect Bilirubin: 0.6 mg/dL (ref 0.2–1.2)
Total Bilirubin: 0.8 mg/dL (ref 0.2–1.2)
Total Protein: 7.2 g/dL (ref 6.1–8.1)

## 2015-06-26 LAB — TSH: TSH: 2.543 u[IU]/mL (ref 0.350–4.500)

## 2015-06-26 MED ORDER — BISOPROLOL-HYDROCHLOROTHIAZIDE 10-6.25 MG PO TABS: ORAL_TABLET | ORAL | Status: DC

## 2015-06-26 NOTE — Progress Notes (Signed)
Patient ID: Paul Warner, male   DOB: June 11, 1960, 55 y.o.   MRN: [id number]  Assessment and Plan:  Hypertension:  -Continue medication -monitor blood pressure at home. -Continue DASH diet -Reminder to go to the ER if any CP, SOB, nausea, dizziness, severe HA, changes vision/speech, left arm numbness and tingling and jaw pain.  Cholesterol - Continue diet and exercise -Check cholesterol.   Diabetes without complications -Continue diet and exercise.  -Check A1C  Vitamin D Def -check level -continue medications.   Continue diet and meds as discussed. Further disposition pending results of labs. Discussed med's effects and SE's.    HPI 55 y.o. male  presents for 3 month follow up with hypertension, hyperlipidemia, diabetes and vitamin D deficiency.   His blood pressure has been controlled at home, today their BP is BP: (!) 158/96 mmHg.He does not workout. He denies chest pain, shortness of breath, dizziness.   He is on cholesterol medication and denies myalgias. His cholesterol is at goal. The cholesterol was:  03/11/2015: Cholesterol 174; HDL 53; LDL Cholesterol 95; Triglycerides 132   He has been working on diet and exercise for diabetes without complications, he is on bASA, he is on ACE/ARB, and denies  foot ulcerations, hyperglycemia, hypoglycemia , increased appetite, nausea, paresthesia of the feet, polydipsia, polyuria, visual disturbances, vomiting and weight loss. Last A1C was: 03/11/2015: Hgb A1c MFr Bld 6.7*   Patient is on Vitamin D supplement. 03/11/2015: Vit D, 25-Hydroxy 45  He has not been working on diet and exercise because he has been traveling and hasn't had the time.    Current Medications:  Current Outpatient Prescriptions on File Prior to Visit  Medication Sig Dispense Refill  . allopurinol (ZYLOPRIM) 300 MG tablet Take 1/2 to 1 tablet daily to prevent gout 90 tablet 1  . aspirin 81 MG tablet Take 81 mg by mouth daily. PRN    . Cholecalciferol (VITAMIN  D PO) Take 10,000 Units by mouth daily.    . meloxicam (MOBIC) 7.5 MG tablet TAKE 1 TABLET (7.5 MG TOTAL) BY MOUTH DAILY. 90 tablet 99  . metFORMIN (GLUCOPHAGE XR) 500 MG 24 hr tablet Start taking 1 tablet PO Qdaily with largest meal.  If tolerating once a day dosing, then increase to 1 tablet PO BID with largest meals. 60 tablet 2  . testosterone cypionate (DEPOTESTOSTERONE CYPIONATE) 200 MG/ML injection INJECT 2CC IM EVERY 2 WEEKS 10 mL 2  . VIAGRA 50 MG tablet TAKE 1 TABLET BY MOUTH AS NEEDED FOR ERECTILE DYSFUNCTION  1   No current facility-administered medications on file prior to visit.   Medical History:  Past Medical History  Diagnosis Date  . Blood transfusion 09/2011    2 units  . Hypertension   . Renal disorder   . Hyperlipidemia   . Stomach ulcer   . Allergy     seasonal   Allergies:  Allergies  Allergen Reactions  . Codeine Nausea And Vomiting  . Nsaids Other (See Comments)    Bleeding ulcer 2013     Review of Systems:  Review of Systems  Constitutional: Negative for fever, chills and malaise/fatigue.  HENT: Negative for congestion, ear pain and sore throat.   Eyes: Negative.   Respiratory: Negative for cough, shortness of breath and wheezing.   Cardiovascular: Negative for chest pain, palpitations and leg swelling.  Gastrointestinal: Negative for heartburn, abdominal pain, diarrhea, constipation, blood in stool and melena.  Genitourinary: Negative.   Skin: Negative.   Neurological: Negative for dizziness,  sensory change, loss of consciousness and headaches.  Psychiatric/Behavioral: Negative for depression and suicidal ideas. The patient is not nervous/anxious.     Family history- Review and unchanged  Social history- Review and unchanged  Physical Exam: BP 158/96 mmHg  Pulse 90  Temp(Src) 98.4 F (36.9 C) (Temporal)  Resp 18  Ht 5' 11.75" (1.822 m)  Wt 284 lb (128.822 kg)  BMI 38.81 kg/m2 Wt Readings from Last 3 Encounters:  06/26/15 284 lb  (128.822 kg)  03/11/15 274 lb 12.8 oz (124.648 kg)  10/30/14 275 lb (124.739 kg)   General Appearance: Well nourished well developed, non-toxic appearing, in no apparent distress. Eyes: PERRLA, EOMs, conjunctiva no swelling or erythema ENT/Mouth: Ear canals clear with no erythema, swelling, or discharge.  TMs normal bilaterally, oropharynx clear, moist, with no exudate.   Neck: Supple, thyroid normal, no JVD, no cervical adenopathy.  Respiratory: Respiratory effort normal, breath sounds clear A&P, no wheeze, rhonchi or rales noted.  No retractions, no accessory muscle usage Cardio: RRR with no MRGs. No noted edema.  Abdomen: Soft, + BS.  Non tender, no guarding, rebound, hernias, masses. Musculoskeletal: Full ROM, 5/5 strength, Normal gait Skin: Warm, dry without rashes, lesions, ecchymosis.  Neuro: Awake and oriented X 3, Cranial nerves intact. No cerebellar symptoms.  Psych: normal affect, Insight and Judgment appropriate.    Starlyn Skeans, PA-C 9:08 AM [facility] 3

## 2015-07-03 ENCOUNTER — Ambulatory Visit: Payer: BLUE CROSS/BLUE SHIELD

## 2015-08-29 ENCOUNTER — Other Ambulatory Visit: Payer: Self-pay | Admitting: Internal Medicine

## 2015-08-29 MED ORDER — DOXYCYCLINE HYCLATE 100 MG PO CAPS: ORAL_CAPSULE | ORAL | Status: AC

## 2015-09-23 ENCOUNTER — Ambulatory Visit: Payer: Self-pay | Admitting: Internal Medicine

## 2015-09-23 ENCOUNTER — Other Ambulatory Visit: Payer: Self-pay | Admitting: *Deleted

## 2015-09-23 ENCOUNTER — Ambulatory Visit (INDEPENDENT_AMBULATORY_CARE_PROVIDER_SITE_OTHER): Payer: BLUE CROSS/BLUE SHIELD | Admitting: Internal Medicine

## 2015-09-23 ENCOUNTER — Encounter: Payer: Self-pay | Admitting: Internal Medicine

## 2015-09-23 DIAGNOSIS — Z79899 Other long term (current) drug therapy: Secondary | ICD-10-CM | POA: Diagnosis not present

## 2015-09-23 DIAGNOSIS — E291 Testicular hypofunction: Secondary | ICD-10-CM | POA: Diagnosis not present

## 2015-09-23 DIAGNOSIS — M1000 Idiopathic gout, unspecified site: Secondary | ICD-10-CM | POA: Diagnosis not present

## 2015-09-23 DIAGNOSIS — E119 Type 2 diabetes mellitus without complications: Secondary | ICD-10-CM

## 2015-09-23 DIAGNOSIS — E559 Vitamin D deficiency, unspecified: Secondary | ICD-10-CM | POA: Diagnosis not present

## 2015-09-23 DIAGNOSIS — E785 Hyperlipidemia, unspecified: Secondary | ICD-10-CM

## 2015-09-23 DIAGNOSIS — M109 Gout, unspecified: Secondary | ICD-10-CM | POA: Insufficient documentation

## 2015-09-23 DIAGNOSIS — E349 Endocrine disorder, unspecified: Secondary | ICD-10-CM

## 2015-09-23 DIAGNOSIS — I10 Essential (primary) hypertension: Secondary | ICD-10-CM | POA: Diagnosis not present

## 2015-09-23 DIAGNOSIS — K219 Gastro-esophageal reflux disease without esophagitis: Secondary | ICD-10-CM | POA: Diagnosis not present

## 2015-09-23 LAB — CBC WITH DIFFERENTIAL/PLATELET
Basophils Absolute: 0 cells/uL (ref 0–200)
Basophils Relative: 0 %
Eosinophils Absolute: 182 cells/uL (ref 15–500)
Eosinophils Relative: 2 %
HCT: 46.3 % (ref 38.5–50.0)
Hemoglobin: 16 g/dL (ref 13.2–17.1)
Lymphocytes Relative: 26 %
Lymphs Abs: 2366 cells/uL (ref 850–3900)
MCH: 31.9 pg (ref 27.0–33.0)
MCHC: 34.6 g/dL (ref 32.0–36.0)
MCV: 92.4 fL (ref 80.0–100.0)
MPV: 9.6 fL (ref 7.5–12.5)
Monocytes Absolute: 728 cells/uL (ref 200–950)
Monocytes Relative: 8 %
Neutro Abs: 5824 cells/uL (ref 1500–7800)
Neutrophils Relative %: 64 %
Platelets: 213 10*3/uL (ref 140–400)
RBC: 5.01 MIL/uL (ref 4.20–5.80)
RDW: 13.2 % (ref 11.0–15.0)
WBC: 9.1 10*3/uL (ref 3.8–10.8)

## 2015-09-23 LAB — HEPATIC FUNCTION PANEL
ALT: 35 U/L (ref 9–46)
AST: 18 U/L (ref 10–35)
Albumin: 4.1 g/dL (ref 3.6–5.1)
Alkaline Phosphatase: 46 U/L (ref 40–115)
Bilirubin, Direct: 0.3 mg/dL — ABNORMAL HIGH (ref <=0.2)
Indirect Bilirubin: 1.1 mg/dL (ref 0.2–1.2)
Total Bilirubin: 1.4 mg/dL — ABNORMAL HIGH (ref 0.2–1.2)
Total Protein: 7.4 g/dL (ref 6.1–8.1)

## 2015-09-23 LAB — BASIC METABOLIC PANEL WITH GFR
BUN: 12 mg/dL (ref 7–25)
CO2: 25 mmol/L (ref 20–31)
Calcium: 9.3 mg/dL (ref 8.6–10.3)
Chloride: 100 mmol/L (ref 98–110)
Creat: 0.93 mg/dL (ref 0.70–1.33)
GFR, Est African American: >89 mL/min (ref >=60)
GFR, Est Non African American: >89 mL/min (ref >=60)
Glucose, Bld: 191 mg/dL — ABNORMAL HIGH (ref 65–99)
Potassium: 4.1 mmol/L (ref 3.5–5.3)
Sodium: 139 mmol/L (ref 135–146)

## 2015-09-23 LAB — HEMOGLOBIN A1C
Hgb A1c MFr Bld: 6.5 % — ABNORMAL HIGH (ref <5.7)
Mean Plasma Glucose: 140 mg/dL

## 2015-09-23 LAB — MAGNESIUM: Magnesium: 1.6 mg/dL (ref 1.5–2.5)

## 2015-09-23 LAB — LIPID PANEL
Cholesterol: 175 mg/dL (ref 125–200)
HDL: 43 mg/dL (ref >=40)
LDL Cholesterol: 96 mg/dL (ref <130)
Total CHOL/HDL Ratio: 4.1 Ratio (ref <=5.0)
Triglycerides: 179 mg/dL — ABNORMAL HIGH (ref <150)
VLDL: 36 mg/dL — ABNORMAL HIGH (ref <30)

## 2015-09-23 LAB — TSH: TSH: 2.59 mIU/L (ref 0.40–4.50)

## 2015-09-23 LAB — URIC ACID: Uric Acid, Serum: 7.3 mg/dL (ref 4.0–7.8)

## 2015-09-23 MED ORDER — TESTOSTERONE CYPIONATE 200 MG/ML IM SOLN: INTRAMUSCULAR | Status: DC

## 2015-09-23 MED ORDER — TESTOSTERONE CYPIONATE 200 MG/ML IM SOLN
400.0000 mg | Freq: Once | INTRAMUSCULAR | Status: AC
  Administered 2015-09-23: 400 mg via INTRAMUSCULAR

## 2015-09-23 MED ORDER — ALLOPURINOL 300 MG PO TABS: ORAL_TABLET | ORAL | Status: DC

## 2015-09-23 NOTE — Progress Notes (Signed)
Patient ID: Paul Warner, male   DOB: [birth year], 55 y.o.   MRN: [id number]     This very nice 55 y.o. MWM presents for 6 month follow up with Hypertension, Hyperlipidemia, Pre-Diabetes and Vitamin D Deficiency. Patient also has a hx/o Gout and has not been taking his allopurinol and has a painful swollenn L ankle w/o hx/o strain.      Patient is treated for HTN since 2001 & BP has been controlled at home. Today's BP: 134/82 mmHg. Patient has had no complaints of any cardiac type chest pain, palpitations, dyspnea/orthopnea/PND, dizziness, claudication, or dependent edema.     Hyperlipidemia is not controlled with diet. Patient denies myalgias or other med SE's. Last Lipids were not at goal with Cholesterol 193; HDL 39*; LDL 125; Triglycerides 147 on 06/26/2015.     Also, the patient has history of T2_NIDDM since 2013 has Rx for Metformin which he's not been taking and also has not been monitoring CBG's and has had no symptoms of reactive hypoglycemia, diabetic polys, paresthesias or visual blurring.  Last A1c was 06/26/2015: Hgb A1c MFr Bld 6.2% on       Patient also has hx/o Low Testosterone 208 in 2013 and ghas been on parenteral therapy with IM Depo-Testosterone. Further, the patient also has history of Vitamin D Deficiency of "20" in 2008 and supplements vitamin D without any suspected side-effects. Last vitamin D was 45 on 03/11/2015.  Medication Sig  . bisoprolol-hctz (ZIAC) 10-6.25  TAKE 1 TAB IN THE MORNING   . VITAMIN D Take 10,000 Units  daily.  . meloxicam  7.5 MG tablet TAKE 1 TAB DAILY.  Marland Kitchen aspirin 81 MG tablet Take 81 mg by mouth daily  . metFORMIN  XR 500 MG  Start taking 1 tablet PO Qdaily with largest meal.  If tolerating once a day dosing, then increase to 1 tablet PO BID with largest meals. (Patient not taking: Reported on 09/23/2015)  . allopurinol  300 MG tablet Take 1/2 to 1 tablet daily to prevent gout - OFF  . testosterone cypio 200 MG/ML inj INJECT 2CC IM EVERY 2 WEEKS  .  VIAGRA 50 MG  TAKE 1 TAB AS NEEDED    Allergies  Allergen Reactions  . Codeine Nausea And Vomiting  . Nsaids Other (See Comments)    Bleeding ulcer 2013   PMHx:   Past Medical History  Diagnosis Date  . Blood transfusion 09/2011    2 units  . Hypertension   . Renal disorder   . Hyperlipidemia   . Stomach ulcer   . Allergy     seasonal   Immunization History  Administered Date(s) Administered  . PPD Test 01/02/2014, 03/11/2015  . Pneumococcal Polysaccharide-23 10/04/2010  . Tdap 10/10/2011   Past Surgical History  Procedure Laterality Date  . Fracture surgery  broken collar bone  . Esophagogastroduodenoscopy  10/26/2011    Procedure: ESOPHAGOGASTRODUODENOSCOPY (EGD);  Surgeon: Irene Shipper, MD;  Location: [facility] ENDOSCOPY;  Service: Endoscopy;  Laterality: N/A;   FHx:    Reviewed / unchanged  SHx:    Reviewed / unchanged  Systems Review:  Constitutional: Denies fever, chills, wt changes, headaches, insomnia, fatigue, night sweats, change in appetite. Eyes: Denies redness, blurred vision, diplopia, discharge, itchy, watery eyes.  ENT: Denies discharge, congestion, post nasal drip, epistaxis, sore throat, earache, hearing loss, dental pain, tinnitus, vertigo, sinus pain, snoring.  CV: Denies chest pain, palpitations, irregular heartbeat, syncope, dyspnea, diaphoresis, orthopnea, PND, claudication or edema. Respiratory: denies cough,  dyspnea, DOE, pleurisy, hoarseness, laryngitis, wheezing.  Gastrointestinal: Denies dysphagia, odynophagia, heartburn, reflux, water brash, abdominal pain or cramps, nausea, vomiting, bloating, diarrhea, constipation, hematemesis, melena, hematochezia  or hemorrhoids. Genitourinary: Denies dysuria, frequency, urgency, nocturia, hesitancy, discharge, hematuria or flank pain. Musculoskeletal: Denies arthralgias, myalgias, stiffness, jt. swelling, pain, limping or strain/sprain.  Skin: Denies pruritus, rash, hives, warts, acne, eczema or change in skin  lesion(s). Neuro: No weakness, tremor, incoordination, spasms, paresthesia or pain. Psychiatric: Denies confusion, memory loss or sensory loss. Endo: Denies change in weight, skin or hair change.  Heme/Lymph: No excessive bleeding, bruising or enlarged lymph nodes.  Physical Exam  BP 134/82 mmHg  Pulse 72  Temp(Src) 98.1 F (36.7 C)  Resp 16  Ht 5' 11.75" (1.822 m)  Wt 285 lb 3.2 oz (129.366 kg)  BMI 38.97 kg/m2  Appears over nourished and morbidly obese and in no distress. Eyes: PERRLA, EOMs, conjunctiva no swelling or erythema. Sinuses: No frontal/maxillary tenderness ENT/Mouth: EAC's clear, TM's nl w/o erythema, bulging. Nares clear w/o erythema, swelling, exudates. Oropharynx clear without erythema or exudates. Oral hygiene is good. Tongue normal, non obstructing. Hearing intact.  Neck: Supple. Thyroid nl. Car 2+/2+ without bruits, nodes or JVD. Chest: Respirations nl with BS clear & equal w/o rales, rhonchi, wheezing or stridor.  Cor: Heart sounds normal w/ regular rate and rhythm without sig. murmurs, gallops, clicks, or rubs. Peripheral pulses normal and equal  without edema.  Abdomen: Soft & bowel sounds normal. Non-tender w/o guarding, rebound, hernias, masses, or organomegaly.  Lymphatics: Unremarkable.  Musculoskeletal: Full ROM all peripheral extremities, joint stability, 5/5 strength, and normal gait. L ankle swollen & tender w/o erythema or calor.  Skin: Warm, dry without exposed rashes, lesions or ecchymosis apparent.  Neuro: Cranial nerves intact, reflexes equal bilaterally. Sensory-motor testing grossly intact. Tendon reflexes grossly intact.  Pysch: Alert & oriented x 3.  Insight and judgement nl & appropriate. No ideations.  Assessment and Plan:  1. Essential hypertension  - TSH  2. Hyperlipidemia  - Lipid panel - TSH  3. T2_NIDDM ([place])  - Hemoglobin A1c - Insulin, random - encouraged to monitor CBG's. Aloe encourged Eye Exam  4. Vitamin D  deficiency  - VITAMIN D 25 Hydroxy   5. Idiopathic gout Lt ankle, suspected   -Rx: Prednisone #`13 over 1 week - Uric acid - Uric acid - encouraged to restart Allopurinol  6. Testosterone deficiency  - testosterone cypionate  injection 400 mg; Inject 2 mLs (400 mg total) into the muscle once.  7. Gastroesophageal reflux disease   8. Medication management  - CBC with Differential/Platelet - BASIC METABOLIC PANEL WITH GFR - Hepatic function panel - Magnesium   Recommended regular exercise, BP monitoring, weight control, and discussed med and SE's. Recommended labs to assess and monitor clinical status. Further disposition pending results of labs. Over 30 minutes of exam, counseling, chart review was performed

## 2015-09-23 NOTE — Patient Instructions (Signed)

## 2015-09-24 LAB — INSULIN, RANDOM: Insulin: 188.1 u[IU]/mL — ABNORMAL HIGH (ref 2.0–19.6)

## 2015-09-24 LAB — VITAMIN D 25 HYDROXY (VIT D DEFICIENCY, FRACTURES): Vit D, 25-Hydroxy: 35 ng/mL (ref 30–100)

## 2015-12-24 ENCOUNTER — Ambulatory Visit (INDEPENDENT_AMBULATORY_CARE_PROVIDER_SITE_OTHER): Payer: BLUE CROSS/BLUE SHIELD | Admitting: Internal Medicine

## 2015-12-24 ENCOUNTER — Encounter: Payer: Self-pay | Admitting: Internal Medicine

## 2015-12-24 VITALS — BP 158/102 | HR 76 | Temp 97.5°F | Ht 71.75 in | Wt 289.2 lb

## 2015-12-24 DIAGNOSIS — K219 Gastro-esophageal reflux disease without esophagitis: Secondary | ICD-10-CM

## 2015-12-24 DIAGNOSIS — E785 Hyperlipidemia, unspecified: Secondary | ICD-10-CM | POA: Diagnosis not present

## 2015-12-24 DIAGNOSIS — E119 Type 2 diabetes mellitus without complications: Secondary | ICD-10-CM | POA: Diagnosis not present

## 2015-12-24 DIAGNOSIS — E559 Vitamin D deficiency, unspecified: Secondary | ICD-10-CM | POA: Diagnosis not present

## 2015-12-24 DIAGNOSIS — I10 Essential (primary) hypertension: Secondary | ICD-10-CM | POA: Diagnosis not present

## 2015-12-24 DIAGNOSIS — E349 Endocrine disorder, unspecified: Secondary | ICD-10-CM

## 2015-12-24 DIAGNOSIS — M1000 Idiopathic gout, unspecified site: Secondary | ICD-10-CM

## 2015-12-24 DIAGNOSIS — E291 Testicular hypofunction: Secondary | ICD-10-CM

## 2015-12-24 DIAGNOSIS — Z79899 Other long term (current) drug therapy: Secondary | ICD-10-CM

## 2015-12-24 LAB — CBC WITH DIFFERENTIAL/PLATELET
Basophils Absolute: 0 cells/uL (ref 0–200)
Basophils Relative: 0 %
Eosinophils Absolute: 324 cells/uL (ref 15–500)
Eosinophils Relative: 6 %
HCT: 47.4 % (ref 38.5–50.0)
Hemoglobin: 16.4 g/dL (ref 13.2–17.1)
Lymphocytes Relative: 38 %
Lymphs Abs: 2052 cells/uL (ref 850–3900)
MCH: 32.3 pg (ref 27.0–33.0)
MCHC: 34.6 g/dL (ref 32.0–36.0)
MCV: 93.5 fL (ref 80.0–100.0)
MPV: 9.6 fL (ref 7.5–12.5)
Monocytes Absolute: 432 cells/uL (ref 200–950)
Monocytes Relative: 8 %
Neutro Abs: 2592 cells/uL (ref 1500–7800)
Neutrophils Relative %: 48 %
Platelets: 202 10*3/uL (ref 140–400)
RBC: 5.07 MIL/uL (ref 4.20–5.80)
RDW: 12.9 % (ref 11.0–15.0)
WBC: 5.4 10*3/uL (ref 3.8–10.8)

## 2015-12-24 LAB — BASIC METABOLIC PANEL WITH GFR
BUN: 18 mg/dL (ref 7–25)
CO2: 22 mmol/L (ref 20–31)
Calcium: 9 mg/dL (ref 8.6–10.3)
Chloride: 104 mmol/L (ref 98–110)
Creat: 0.98 mg/dL (ref 0.70–1.33)
GFR, Est African American: >89 mL/min (ref >=60)
GFR, Est Non African American: 87 mL/min (ref >=60)
Glucose, Bld: 244 mg/dL — ABNORMAL HIGH (ref 65–99)
Potassium: 4.2 mmol/L (ref 3.5–5.3)
Sodium: 138 mmol/L (ref 135–146)

## 2015-12-24 LAB — URIC ACID: Uric Acid, Serum: 5.4 mg/dL (ref 4.0–8.0)

## 2015-12-24 LAB — LIPID PANEL
Cholesterol: 173 mg/dL (ref 125–200)
HDL: 38 mg/dL — ABNORMAL LOW (ref >=40)
LDL Cholesterol: 90 mg/dL (ref <130)
Total CHOL/HDL Ratio: 4.6 Ratio (ref <=5.0)
Triglycerides: 224 mg/dL — ABNORMAL HIGH (ref <150)
VLDL: 45 mg/dL — ABNORMAL HIGH (ref <30)

## 2015-12-24 LAB — HEPATIC FUNCTION PANEL
ALT: 54 U/L — ABNORMAL HIGH (ref 9–46)
AST: 37 U/L — ABNORMAL HIGH (ref 10–35)
Albumin: 4.1 g/dL (ref 3.6–5.1)
Alkaline Phosphatase: 53 U/L (ref 40–115)
Bilirubin, Direct: 0.2 mg/dL (ref <=0.2)
Indirect Bilirubin: 0.5 mg/dL (ref 0.2–1.2)
Total Bilirubin: 0.7 mg/dL (ref 0.2–1.2)
Total Protein: 6.9 g/dL (ref 6.1–8.1)

## 2015-12-24 LAB — MAGNESIUM: Magnesium: 1.7 mg/dL (ref 1.5–2.5)

## 2015-12-24 LAB — HEMOGLOBIN A1C
Hgb A1c MFr Bld: 7.1 % — ABNORMAL HIGH (ref <5.7)
Mean Plasma Glucose: 157 mg/dL

## 2015-12-24 LAB — TSH: TSH: 2.69 mIU/L (ref 0.40–4.50)

## 2015-12-24 MED ORDER — TESTOSTERONE CYPIONATE 200 MG/ML IM SOLN
400.0000 mg | Freq: Once | INTRAMUSCULAR | Status: AC
  Administered 2015-12-24: 400 mg via INTRAMUSCULAR

## 2015-12-24 NOTE — Progress Notes (Signed)
[place] [facility]                       [name], M.D.        Uvaldo Bristle. Silverio Lay, P.A.-C       Starlyn Skeans, P.A.-C   [facility]                [address] [place], [place] [id number] Telephone [phone] Telefax [phone] ______________________________________________________________________     This very nice 55 y.o. MWM presents for  follow up with Hypertension, Hyperlipidemia, T2_DM  and Vitamin D Deficiency.      Patient is treated for HTN circa 2001 & BP has been controlled at home. Today's BP is elevated at 158/102 and re-checked at 138/96.  He alleges this is "whitecoat" HTN and had only taken his BP med 1 hour before today's visit and that otherwise random BP's have been WNL. Patient has had no complaints of any cardiac type chest pain, palpitations, dyspnea/orthopnea/PND, dizziness, claudication, or dependent edema. Parient also has hx/o GERD and denis recent sx's dyspepsia attributed to better diet.      Hyperlipidemia is controlled with diet & meds. Patient denies myalgias or other med SE's. Last Lipids were  Lab Results  Component Value Date   CHOL 175 09/23/2015   HDL 43 09/23/2015   LDLCALC 96 09/23/2015   TRIG 179 (H) 09/23/2015   CHOLHDL 4.1 09/23/2015      Also, the patient has history of Morbid Obesity (BMI 39.50) and consequent  T2_NIDDM and has had no symptoms of reactive hypoglycemia, diabetic polys, paresthesias or visual blurring.  He admits infrewquent CBG monitoring. Last A1c was not at goal.  Lab Results  Component Value Date   HGBA1C 6.5 (H) 09/23/2015      Patient also has prior hx/o Gout and denies recent flare-ups. Further, the patient also has history of Vitamin D Deficiency  Of "20" in 2008 and supplements vitamin D sporadically. Last vitamin D was still low & not at goal.  Lab Results  Component Value Date   VD25OH 35 09/23/2015   Current Outpatient  Prescriptions on File Prior to Visit  Medication Sig  . allopurinol (ZYLOPRIM) 300 MG tablet Take 1/2 to 1 tablet daily to prevent gout  . aspirin 81 MG tablet Take 81 mg by mouth daily. Reported on 09/23/2015  . bisoprolol-hydrochlorothiazide (ZIAC) 10-6.25 MG tablet TAKE 1 TABLET BY MOUTH IN THE MORNING FOR BLOOD PRESSURE  . Cholecalciferol (VITAMIN D PO) Take 10,000 Units by mouth daily.  . meloxicam (MOBIC) 7.5 MG tablet TAKE 1 TABLET (7.5 MG TOTAL) BY MOUTH DAILY.  . metFORMIN (GLUCOPHAGE XR) 500 MG 24 hr tablet Start taking 1 tablet PO Qdaily with largest meal.  If tolerating once a day dosing, then increase to 1 tablet PO BID with largest meals.  Marland Kitchen testosterone cypionate (DEPOTESTOSTERONE CYPIONATE) 200 MG/ML injection INJECT 2CC IM EVERY 2 WEEKS   No current facility-administered medications on file prior to visit.    Allergies  Allergen Reactions  . Codeine Nausea And Vomiting  . Nsaids Other (See Comments)    Bleeding ulcer 2013   PMHx:   Past Medical History:  Diagnosis Date  . Allergy    seasonal  . Blood transfusion 09/2011   2 units  . Hyperlipidemia   . Hypertension   . Renal disorder   .  Stomach ulcer    Immunization History  Administered Date(s) Administered  . PPD Test 01/02/2014, 03/11/2015  . Pneumococcal Polysaccharide-23 10/04/2010  . Tdap 10/10/2011   Past Surgical History:  Procedure Laterality Date  . ESOPHAGOGASTRODUODENOSCOPY  10/26/2011   Procedure: ESOPHAGOGASTRODUODENOSCOPY (EGD);  Surgeon: Irene Shipper, MD;  Location: [facility] ENDOSCOPY;  Service: Endoscopy;  Laterality: N/A;  . FRACTURE SURGERY  broken collar bone   FHx:    Reviewed / unchanged  SHx:    Reviewed / unchanged  Systems Review:  Constitutional: Denies fever, chills, wt changes, headaches, insomnia, fatigue, night sweats, change in appetite. Eyes: Denies redness, blurred vision, diplopia, discharge, itchy, watery eyes.  ENT: Denies discharge, congestion, post nasal drip, epistaxis,  sore throat, earache, hearing loss, dental pain, tinnitus, vertigo, sinus pain, snoring.  CV: Denies chest pain, palpitations, irregular heartbeat, syncope, dyspnea, diaphoresis, orthopnea, PND, claudication or edema. Respiratory: denies cough, dyspnea, DOE, pleurisy, hoarseness, laryngitis, wheezing.  Gastrointestinal: Denies dysphagia, odynophagia, heartburn, reflux, water brash, abdominal pain or cramps, nausea, vomiting, bloating, diarrhea, constipation, hematemesis, melena, hematochezia  or hemorrhoids. Genitourinary: Denies dysuria, frequency, urgency, nocturia, hesitancy, discharge, hematuria or flank pain. Musculoskeletal: Denies arthralgias, myalgias, stiffness, jt. swelling, pain, limping or strain/sprain.  Skin: Denies pruritus, rash, hives, warts, acne, eczema or change in skin lesion(s). Neuro: No weakness, tremor, incoordination, spasms, paresthesia or pain. Psychiatric: Denies confusion, memory loss or sensory loss. Endo: Denies change in weight, skin or hair change.  Heme/Lymph: No excessive bleeding, bruising or enlarged lymph nodes.  Physical Exam  BP (!) 158/102   Pulse 76   Temp 97.5 F (36.4 C)   Ht 5' 11.75" (1.822 m)   Wt 289 lb 3.2 oz (131.2 kg)   BMI 39.50 kg/m   Appears well nourished and in no distress. Eyes: PERRLA, EOMs, conjunctiva no swelling or erythema. Sinuses: No frontal/maxillary tenderness ENT/Mouth: EAC's clear, TM's nl w/o erythema, bulging. Nares clear w/o erythema, swelling, exudates. Oropharynx clear without erythema or exudates. Oral hygiene is good. Tongue normal, non obstructing. Hearing intact.  Neck: Supple. Thyroid nl. Car 2+/2+ without bruits, nodes or JVD. Chest: Respirations nl with BS clear & equal w/o rales, rhonchi, wheezing or stridor.  Cor: Heart sounds normal w/ regular rate and rhythm without sig. murmurs, gallops, clicks, or rubs. Peripheral pulses normal and equal  without edema.  Abdomen: Soft & bowel sounds normal. Non-tender  w/o guarding, rebound, hernias, masses, or organomegaly.  Lymphatics: Unremarkable.  Musculoskeletal: Full ROM all peripheral extremities, joint stability, 5/5 strength, and normal gait.  Skin: Warm, dry without exposed rashes, lesions or ecchymosis apparent.  Neuro: Cranial nerves intact, reflexes equal bilaterally. Sensory-motor testing grossly intact. Tendon reflexes grossly intact.  Pysch: Alert & oriented x 3.  Insight and judgement nl & appropriate. No ideations.  Assessment and Plan:   1. Essential hypertension  - Continue medication, monitor blood pressure at home. Continue DASH diet. Reminder to go to the ER if any CP, SOB, nausea, dizziness, severe HA, changes vision/speech, left arm numbness and tingling and jaw pain. - TSH  2. Hyperlipidemia  - Continue diet/meds, exercise,& lifestyle modifications. Continue monitor periodic cholesterol/liver & renal functions  - Lipid panel - TSH  3. T2_NIDDM ([place])  - Continue diet, exercise, lifestyle modifications. Monitor appropriate labs. - Hemoglobin A1c - Insulin, random  4. Vitamin D deficiency  - Continue supplementation. - VITAMIN D 25 Hydroxy  5. Testosterone deficiency  - Testosterone - testosterone cypionate  injection 400 mg; Inject 2 mLs (400 mg) into the  muscle every 2 weels  6. Idiopathic gout  - Uric acid  7. Gastroesophageal reflux disease   8. Medication management  - CBC with Differential/Platelet - BASIC METABOLIC PANEL WITH GFR - Hepatic function panel - Magnesium   Recommended regular exercise, BP monitoring, weight control, and discussed med and SE's. Recommended labs to assess and monitor clinical status. Further disposition pending results of labs. Over 30 minutes of exam, counseling, chart review was performed

## 2015-12-24 NOTE — Patient Instructions (Signed)
Gout Gout is an inflammatory arthritis caused by a buildup of uric acid crystals in the joints. Uric acid is a chemical that is normally present in the blood. When the level of uric acid in the blood is too high it can form crystals that deposit in your joints and tissues. This causes joint redness, soreness, and swelling (inflammation). Repeat attacks are common. Over time, uric acid crystals can form into masses (tophi) near a joint, destroying bone and causing disfigurement. Gout is treatable and often preventable. CAUSES  The disease begins with elevated levels of uric acid in the blood. Uric acid is produced by your body when it breaks down a naturally found substance called purines. Certain foods you eat, such as meats and fish, contain high amounts of purines. Causes of an elevated uric acid level include:  Being passed down from parent to child (heredity).  Diseases that cause increased uric acid production (such as obesity, psoriasis, and certain cancers).  Excessive alcohol use.  Diet, especially diets rich in meat and seafood.  Medicines, including certain cancer-fighting medicines (chemotherapy), water pills (diuretics), and aspirin.  Chronic kidney disease. The kidneys are no longer able to remove uric acid well.  Problems with metabolism. Conditions strongly associated with gout include:  Obesity.  High blood pressure.  High cholesterol.  Diabetes. Not everyone with elevated uric acid levels gets gout. It is not understood why some people get gout and others do not. Surgery, joint injury, and eating too much of certain foods are some of the factors that can lead to gout attacks. SYMPTOMS   An attack of gout comes on quickly. It causes intense pain with redness, swelling, and warmth in a joint.  Fever can occur.  Often, only one joint is involved. Certain joints are more commonly involved:  Base of the big toe.  Knee.  Ankle.  Wrist.  Finger. Without  treatment, an attack usually goes away in a few days to weeks. Between attacks, you usually will not have symptoms, which is different from many other forms of arthritis. DIAGNOSIS  Your caregiver will suspect gout based on your symptoms and exam. In some cases, tests may be recommended. The tests may include:  Blood tests.  Urine tests.  X-rays.  Joint fluid exam. This exam requires a needle to remove fluid from the joint (arthrocentesis). Using a microscope, gout is confirmed when uric acid crystals are seen in the joint fluid. TREATMENT  There are two phases to gout treatment: treating the sudden onset (acute) attack and preventing attacks (prophylaxis).  Treatment of an Acute Attack.  Medicines are used. These include anti-inflammatory medicines or steroid medicines.  An injection of steroid medicine into the affected joint is sometimes necessary.  The painful joint is rested. Movement can worsen the arthritis.  You may use warm or cold treatments on painful joints, depending which works best for you.  Treatment to Prevent Attacks.  If you suffer from frequent gout attacks, your caregiver may advise preventive medicine. These medicines are started after the acute attack subsides. These medicines either help your kidneys eliminate uric acid from your body or decrease your uric acid production. You may need to stay on these medicines for a very long time.  The early phase of treatment with preventive medicine can be associated with an increase in acute gout attacks. For this reason, during the first few months of treatment, your caregiver may also advise you to take medicines usually used for acute gout treatment. Be sure you   understand your caregiver's directions. Your caregiver may make several adjustments to your medicine dose before these medicines are effective.  Discuss dietary treatment with your caregiver or dietitian. Alcohol and drinks high in sugar and fructose and foods  such as meat, poultry, and seafood can increase uric acid levels. Your caregiver or dietitian can advise you on drinks and foods that should be limited. HOME CARE INSTRUCTIONS   Do not take aspirin to relieve pain. This raises uric acid levels.  Only take over-the-counter or prescription medicines for pain, discomfort, or fever as directed by your caregiver.  Rest the joint as much as possible. When in bed, keep sheets and blankets off painful areas.  Keep the affected joint raised (elevated).  Apply warm or cold treatments to painful joints. Use of warm or cold treatments depends on which works best for you.  Use crutches if the painful joint is in your leg.  Drink enough fluids to keep your urine clear or pale yellow. This helps your body get rid of uric acid. Limit alcohol, sugary drinks, and fructose drinks.  Follow your dietary instructions. Pay careful attention to the amount of protein you eat. Your daily diet should emphasize fruits, vegetables, whole grains, and fat-free or low-fat milk products. Discuss the use of coffee, vitamin C, and cherries with your caregiver or dietitian. These may be helpful in lowering uric acid levels.  Maintain a healthy body weight. SEEK MEDICAL CARE IF:   You develop diarrhea, vomiting, or any side effects from medicines.  You do not feel better in 24 hours, or you are getting worse. SEEK IMMEDIATE MEDICAL CARE IF:   Your joint becomes suddenly more tender, and you have chills or a fever. MAKE SURE YOU:   Understand these instructions.  Will watch your condition.  Will get help right away if you are not doing well or get worse.   This information is not intended to replace advice given to you by your health care provider. Make sure you discuss any questions you have with your health care provider.   Document Released: 05/13/2000 Document Revised: 06/06/2014 Document Reviewed: 12/28/2011 Elsevier Interactive Patient Education 2016  [employer]  ++++++++++++++++++++++++++++++++++++++++++++++++++++++++++ Recommend Adult Low Dose Aspirin or   coated  Aspirin 81 mg daily   To reduce risk of Colon Cancer 20 %,   Skin Cancer 26 % ,   Melanoma 46%   and   Pancreatic cancer 60%   ++++++++++++++++++++++++++++++++++++++++++++++++++++++ Vitamin D goal   is between 70-100.   Please make sure that you are taking your Vitamin D as directed.   It is very important as a natural anti-inflammatory   helping hair, skin, and nails, as well as reducing stroke and heart attack risk.   It helps your bones and helps with mood.  It also decreases numerous cancer risks so please take it as directed.   Low Vit D is associated with a 200-300% higher risk for CANCER   and 200-300% higher risk for HEART   ATTACK  &  STROKE.   ......................................  It is also associated with higher death rate at younger ages,   autoimmune diseases like Rheumatoid arthritis, Lupus, Multiple Sclerosis.     Also many other serious conditions, like depression, Alzheimer's  Dementia, infertility, muscle aches, fatigue, fibromyalgia - just to name a few.  ++++++++++++++++++++++++++++++++++++++++++++++++  Recommend the book "The END of DIETING" by Dr Joel Fuhrman   & the book "The END of DIABETES " by Dr Joel Fuhrman  At   [employer] - get book & Audio CD's     Being diabetic has a  300% increased risk for heart attack, stroke, cancer, and alzheimer- type vascular dementia. It is very important that you work harder with diet by avoiding all foods that are white. Avoid white rice (brown & wild rice is OK), white potatoes (sweetpotatoes in moderation is OK), White bread or wheat bread or anything made out of white flour like bagels, donuts, rolls, buns, biscuits, cakes, pastries, cookies, pizza crust, and pasta (made from white flour & egg whites) - vegetarian pasta or spinach or wheat pasta is OK. Multigrain breads like  Arnold's or [employer], or multigrain sandwich thins or flatbreads.  Diet, exercise and weight loss can reverse and cure diabetes in the early stages.  Diet, exercise and weight loss is very important in the control and prevention of complications of diabetes which affects every system in your body, ie. Brain - dementia/stroke, eyes - glaucoma/blindness, heart - heart attack/heart failure, kidneys - dialysis, stomach - gastric paralysis, intestines - malabsorption, nerves - severe painful neuritis, circulation - gangrene & loss of a leg(s), and finally cancer and Alzheimers.    I recommend avoid fried & greasy foods,  sweets/candy, white rice (brown or wild rice or Quinoa is OK), white potatoes (sweet potatoes are OK) - anything made from white flour - bagels, doughnuts, rolls, buns, biscuits,white and wheat breads, pizza crust and traditional pasta made of white flour & egg white(vegetarian pasta or spinach or wheat pasta is OK).  Multi-grain bread is OK - like multi-grain flat bread or sandwich thins. Avoid alcohol in excess. Exercise is also important.    Eat all the vegetables you want - avoid meat, especially red meat and dairy - especially cheese.  Cheese is the most concentrated form of trans-fats which is the worst thing to clog up our arteries. Veggie cheese is OK which can be found in the fresh produce section at [employer] or [employer] or [employer]  ++++++++++++++++++++++++++++++++++++++++++++++++++ DASH Eating Plan  DASH stands for "Dietary Approaches to Stop Hypertension."   The DASH eating plan is a healthy eating plan that has been shown to reduce high blood pressure (hypertension). Additional health benefits may include reducing the risk of type 2 diabetes mellitus, heart disease, and stroke. The DASH eating plan may also help with weight loss.  WHAT DO I NEED TO KNOW ABOUT THE DASH EATING PLAN?  For the DASH eating plan, you will follow these general guidelines:  Choose  foods with a percent daily value for sodium of less than 5% (as listed on the food label).  Use salt-free seasonings or herbs instead of table salt or sea salt.  Check with your health care provider or pharmacist before using salt substitutes.  Eat lower-sodium products, often labeled as "lower sodium" or "no salt added."  Eat fresh foods.  Eat more vegetables, fruits, and low-fat dairy products.    Choose whole grains. Look for the word "whole" as the first word in the ingredient list.  Choose fish   Limit sweets, desserts, sugars, and sugary drinks.  Choose heart-healthy fats.  Eat veggie cheese   Eat more home-cooked food and less restaurant, buffet, and fast food.  Limit fried foods.  Cook foods using methods other than frying.  Limit canned vegetables. If you do use them, rinse them well to decrease the sodium.  When eating at a restaurant, ask that your food be prepared with less salt, or no salt if possible.                        WHAT FOODS CAN I EAT?  Read Dr Joel Fuhrman's books on The End of Dieting & The End of Diabetes  Grains  Whole grain or whole wheat bread. Brown rice. Whole grain or whole wheat pasta. Quinoa, bulgur, and whole grain cereals. Low-sodium cereals. Corn or whole wheat flour tortillas. Whole grain cornbread. Whole grain crackers. Low-sodium crackers.  Vegetables  Fresh or frozen vegetables (raw, steamed, roasted, or grilled). Low-sodium or reduced-sodium tomato and vegetable juices. Low-sodium or reduced-sodium tomato sauce and paste. Low-sodium or reduced-sodium canned vegetables.   Fruits  All fresh, canned (in natural juice), or frozen fruits.  Protein Products   All fish and seafood.  Dried beans, peas, or lentils. Unsalted nuts and seeds. Unsalted canned beans.  Dairy  Low-fat dairy products, such as skim or 1% milk, 2% or reduced-fat cheeses, low-fat ricotta or cottage cheese, or plain low-fat yogurt. Low-sodium or  reduced-sodium cheeses.  Fats and Oils  Tub margarines without trans fats. Light or reduced-fat mayonnaise and salad dressings (reduced sodium). Avocado. Safflower, olive, or canola oils. Natural peanut or almond butter.  Other  Unsalted popcorn and pretzels. The items listed above may not be a complete list of recommended foods or beverages. Contact your dietitian for more options.  +++++++++++++++++++++++++++++++++++++++++++  WHAT FOODS ARE NOT RECOMMENDED?  Grains/ White flour or wheat flour  White bread. White pasta. White rice. Refined cornbread. Bagels and croissants. Crackers that contain trans fat.  Vegetables  Creamed or fried vegetables. Vegetables in a . Regular canned vegetables. Regular canned tomato sauce and paste. Regular tomato and vegetable juices.  Fruits  Dried fruits. Canned fruit in light or heavy syrup. Fruit juice.  Meat and Other Protein Products  Meat in general - RED mwaet & White meat.  Fatty cuts of meat. Ribs, chicken wings, bacon, sausage, bologna, salami, chitterlings, fatback, hot dogs, bratwurst, and packaged luncheon meats.  Dairy  Whole or 2% milk, cream, half-and-half, and cream cheese. Whole-fat or sweetened yogurt. Full-fat cheeses or blue cheese. Nondairy creamers and whipped toppings. Processed cheese, cheese spreads, or cheese curds.  Condiments  Onion and garlic salt, seasoned salt, table salt, and sea salt. Canned and packaged gravies. Worcestershire sauce. Tartar sauce. Barbecue sauce. Teriyaki sauce. Soy sauce, including reduced sodium. Steak sauce. Fish sauce. Oyster sauce. Cocktail sauce. Horseradish. Ketchup and mustard. Meat flavorings and tenderizers. Bouillon cubes. Hot sauce. Tabasco sauce. Marinades. Taco seasonings. Relishes.  Fats and Oils Butter, stick margarine, lard, shortening and bacon fat. Coconut, palm kernel, or palm oils. Regular salad dressings.  Pickles and olives. Salted popcorn and pretzels.  The items  listed above may not be a complete list of foods and beverages to avoid.   

## 2015-12-24 NOTE — Progress Notes (Signed)
Patient given Testosterone Cypionate 200 mg / ml 2 ml IM in left upper outer quadrant.

## 2015-12-25 LAB — VITAMIN D 25 HYDROXY (VIT D DEFICIENCY, FRACTURES): Vit D, 25-Hydroxy: 39 ng/mL (ref 30–100)

## 2015-12-25 LAB — TESTOSTERONE: Testosterone: 297 ng/dL (ref 250–827)

## 2015-12-25 LAB — INSULIN, RANDOM: Insulin: 131 u[IU]/mL — ABNORMAL HIGH (ref 2.0–19.6)

## 2015-12-26 ENCOUNTER — Encounter: Payer: Self-pay | Admitting: Internal Medicine

## 2016-01-14 ENCOUNTER — Ambulatory Visit (INDEPENDENT_AMBULATORY_CARE_PROVIDER_SITE_OTHER): Payer: BLUE CROSS/BLUE SHIELD

## 2016-01-14 DIAGNOSIS — E291 Testicular hypofunction: Secondary | ICD-10-CM

## 2016-01-14 MED ORDER — TESTOSTERONE CYPIONATE 200 MG/ML IM SOLN
200.0000 mg | INTRAMUSCULAR | Status: DC
  Administered 2016-01-14: 200 mg via INTRAMUSCULAR

## 2016-01-14 NOTE — Progress Notes (Signed)
Patient presents for testosterone injection for hypogonadism Tx.  Patient received 2 cc IM RIGHT glute and tolerated well.

## 2016-02-04 ENCOUNTER — Ambulatory Visit (INDEPENDENT_AMBULATORY_CARE_PROVIDER_SITE_OTHER): Payer: BLUE CROSS/BLUE SHIELD

## 2016-02-04 DIAGNOSIS — E291 Testicular hypofunction: Secondary | ICD-10-CM

## 2016-02-04 MED ORDER — TESTOSTERONE CYPIONATE 200 MG/ML IM SOLN
200.0000 mg | INTRAMUSCULAR | Status: DC
  Administered 2016-02-04 – 2016-07-13 (×2): 200 mg via INTRAMUSCULAR

## 2016-02-04 NOTE — Progress Notes (Signed)
Patient presents for testosterone injection for hypogonadism Tx. Patient received 2 cc IM LEFT glute and tolerated well.

## 2016-02-26 ENCOUNTER — Ambulatory Visit: Payer: Self-pay

## 2016-03-31 ENCOUNTER — Encounter: Payer: Self-pay | Admitting: Internal Medicine

## 2016-03-31 ENCOUNTER — Other Ambulatory Visit: Payer: Self-pay | Admitting: Internal Medicine

## 2016-03-31 ENCOUNTER — Ambulatory Visit (INDEPENDENT_AMBULATORY_CARE_PROVIDER_SITE_OTHER): Payer: BLUE CROSS/BLUE SHIELD | Admitting: Internal Medicine

## 2016-03-31 VITALS — BP 142/88 | HR 60 | Temp 97.5°F | Resp 16 | Ht 72.0 in | Wt 281.0 lb

## 2016-03-31 DIAGNOSIS — E119 Type 2 diabetes mellitus without complications: Secondary | ICD-10-CM

## 2016-03-31 DIAGNOSIS — K219 Gastro-esophageal reflux disease without esophagitis: Secondary | ICD-10-CM

## 2016-03-31 DIAGNOSIS — Z111 Encounter for screening for respiratory tuberculosis: Secondary | ICD-10-CM | POA: Diagnosis not present

## 2016-03-31 DIAGNOSIS — Z125 Encounter for screening for malignant neoplasm of prostate: Secondary | ICD-10-CM | POA: Diagnosis not present

## 2016-03-31 DIAGNOSIS — Z136 Encounter for screening for cardiovascular disorders: Secondary | ICD-10-CM

## 2016-03-31 DIAGNOSIS — E559 Vitamin D deficiency, unspecified: Secondary | ICD-10-CM | POA: Diagnosis not present

## 2016-03-31 DIAGNOSIS — G4733 Obstructive sleep apnea (adult) (pediatric): Secondary | ICD-10-CM

## 2016-03-31 DIAGNOSIS — Z79899 Other long term (current) drug therapy: Secondary | ICD-10-CM | POA: Diagnosis not present

## 2016-03-31 DIAGNOSIS — E782 Mixed hyperlipidemia: Secondary | ICD-10-CM

## 2016-03-31 DIAGNOSIS — Z1212 Encounter for screening for malignant neoplasm of rectum: Secondary | ICD-10-CM

## 2016-03-31 DIAGNOSIS — Z0000 Encounter for general adult medical examination without abnormal findings: Secondary | ICD-10-CM

## 2016-03-31 DIAGNOSIS — R5383 Other fatigue: Secondary | ICD-10-CM

## 2016-03-31 DIAGNOSIS — I10 Essential (primary) hypertension: Secondary | ICD-10-CM | POA: Diagnosis not present

## 2016-03-31 DIAGNOSIS — M1000 Idiopathic gout, unspecified site: Secondary | ICD-10-CM

## 2016-03-31 DIAGNOSIS — E349 Endocrine disorder, unspecified: Secondary | ICD-10-CM

## 2016-03-31 DIAGNOSIS — Z0001 Encounter for general adult medical examination with abnormal findings: Secondary | ICD-10-CM

## 2016-03-31 LAB — IRON AND TIBC
%SAT: 48 % (ref 15–60)
Iron: 161 ug/dL (ref 50–180)
TIBC: 332 ug/dL (ref 250–425)
UIBC: 171 ug/dL (ref 125–400)

## 2016-03-31 LAB — CBC WITH DIFFERENTIAL/PLATELET
Basophils Absolute: 0 cells/uL (ref 0–200)
Basophils Relative: 0 %
Eosinophils Absolute: 248 cells/uL (ref 15–500)
Eosinophils Relative: 4 %
HCT: 50.8 % — ABNORMAL HIGH (ref 38.5–50.0)
Hemoglobin: 17.7 g/dL — ABNORMAL HIGH (ref 13.2–17.1)
Lymphocytes Relative: 36 %
Lymphs Abs: 2232 cells/uL (ref 850–3900)
MCH: 32.1 pg (ref 27.0–33.0)
MCHC: 34.8 g/dL (ref 32.0–36.0)
MCV: 92 fL (ref 80.0–100.0)
MPV: 9.9 fL (ref 7.5–12.5)
Monocytes Absolute: 434 cells/uL (ref 200–950)
Monocytes Relative: 7 %
Neutro Abs: 3286 cells/uL (ref 1500–7800)
Neutrophils Relative %: 53 %
Platelets: 202 10*3/uL (ref 140–400)
RBC: 5.52 MIL/uL (ref 4.20–5.80)
RDW: 12.7 % (ref 11.0–15.0)
WBC: 6.2 10*3/uL (ref 3.8–10.8)

## 2016-03-31 LAB — HEPATIC FUNCTION PANEL
ALT: 48 U/L — ABNORMAL HIGH (ref 9–46)
AST: 36 U/L — ABNORMAL HIGH (ref 10–35)
Albumin: 4.4 g/dL (ref 3.6–5.1)
Alkaline Phosphatase: 52 U/L (ref 40–115)
Bilirubin, Direct: 0.2 mg/dL (ref <=0.2)
Indirect Bilirubin: 0.7 mg/dL (ref 0.2–1.2)
Total Bilirubin: 0.9 mg/dL (ref 0.2–1.2)
Total Protein: 7.6 g/dL (ref 6.1–8.1)

## 2016-03-31 LAB — BASIC METABOLIC PANEL WITH GFR
BUN: 15 mg/dL (ref 7–25)
CO2: 25 mmol/L (ref 20–31)
Calcium: 9.6 mg/dL (ref 8.6–10.3)
Chloride: 102 mmol/L (ref 98–110)
Creat: 1.05 mg/dL (ref 0.70–1.33)
GFR, Est African American: >89 mL/min (ref >=60)
GFR, Est Non African American: 80 mL/min (ref >=60)
Glucose, Bld: 144 mg/dL — ABNORMAL HIGH (ref 65–99)
Potassium: 4.1 mmol/L (ref 3.5–5.3)
Sodium: 139 mmol/L (ref 135–146)

## 2016-03-31 LAB — MICROALBUMIN / CREATININE URINE RATIO
Creatinine, Urine: 181 mg/dL (ref 20–370)
Microalb Creat Ratio: 5 mcg/mg creat (ref <30)
Microalb, Ur: 0.9 mg/dL

## 2016-03-31 LAB — URINALYSIS, ROUTINE W REFLEX MICROSCOPIC
Bilirubin Urine: NEGATIVE
Glucose, UA: NEGATIVE
Hgb urine dipstick: NEGATIVE
Ketones, ur: NEGATIVE
Leukocytes, UA: NEGATIVE
Nitrite: NEGATIVE
Protein, ur: NEGATIVE
Specific Gravity, Urine: 1.02 (ref 1.001–1.035)
pH: 5.5 (ref 5.0–8.0)

## 2016-03-31 LAB — LIPID PANEL
Cholesterol: 192 mg/dL (ref 125–200)
HDL: 40 mg/dL (ref >=40)
LDL Cholesterol: 121 mg/dL (ref <130)
Total CHOL/HDL Ratio: 4.8 Ratio (ref <=5.0)
Triglycerides: 153 mg/dL — ABNORMAL HIGH (ref <150)
VLDL: 31 mg/dL — ABNORMAL HIGH (ref <30)

## 2016-03-31 LAB — MAGNESIUM: Magnesium: 1.8 mg/dL (ref 1.5–2.5)

## 2016-03-31 LAB — PSA: PSA: 0.2 ng/mL (ref <=4.0)

## 2016-03-31 LAB — TSH: TSH: 2.44 mIU/L (ref 0.40–4.50)

## 2016-03-31 LAB — HEMOGLOBIN A1C
Hgb A1c MFr Bld: 6.9 % — ABNORMAL HIGH (ref <5.7)
Mean Plasma Glucose: 151 mg/dL

## 2016-03-31 LAB — TESTOSTERONE: Testosterone: 209 ng/dL — ABNORMAL LOW (ref 250–827)

## 2016-03-31 LAB — VITAMIN B12: Vitamin B-12: 459 pg/mL (ref 200–1100)

## 2016-03-31 MED ORDER — TESTOSTERONE CYPIONATE 200 MG/ML IM SOLN
400.0000 mg | Freq: Once | INTRAMUSCULAR | Status: AC
  Administered 2016-03-31: 400 mg via INTRAMUSCULAR

## 2016-03-31 MED ORDER — ATORVASTATIN CALCIUM 80 MG PO TABS: ORAL_TABLET | ORAL | 1 refills | Status: DC

## 2016-03-31 NOTE — Patient Instructions (Signed)

## 2016-03-31 NOTE — Progress Notes (Signed)
[facility]   [name], M.D.    Uvaldo Bristle. Silverio Lay, P.A.-C      Starlyn Skeans, P.A.-C  [facility]                [address] [place], [place] [id number] Telephone [phone] Telefax [phone] Annual  Screening/Preventative Visit  & Comprehensive Evaluation & Examination     This very nice 55 y.o. MWM presents for a Screening/Preventative Visit & comprehensive evaluation and management of multiple medical co-morbidities.  Patient has been followed for HTN, T2_NIDDM  , Hyperlipidemia and Vitamin D Deficiency. Patient also has OSA & is on CPAP with improved sleep hygiene and less hypersomnolence.     HTN predates circa 2001. Patient's BP has been controlled at home.Today's BP is 142/88 and rechecked at 138/84.  Patient denies any cardiac symptoms as chest pain, palpitations, shortness of breath, dizziness or ankle swelling.     Patient's hyperlipidemia is controlled with diet and medications. Patient denies myalgias or other medication SE's. Last lipids were at goal albeit elevated Trig's: Lab Results  Component Value Date   CHOL 192 03/31/2016   HDL 40 03/31/2016   LDLCALC 121 03/31/2016   TRIG 153 (H) 03/31/2016   CHOLHDL 4.8 03/31/2016      Patient has hx/o Morbid Obesity (BMI 38+) and hx/o A1c 5.6% w/elevated insulin of 263  predating since 2009. In Feb 2016, he was started on Metformin for insulin resistance and then self d/c'd the Metformin by Oct 2016. And then restarted at 1 tab daily. Patient denies reactive hypoglycemic symptoms, visual blurring, diabetic polys or paresthesias. Last A1c was still not at goal and meeting criteria for T2_DM: Lab Results  Component Value Date   HGBA1C 7.1 (H) 12/24/2015       Patient has Testosterone Deficiency since 2011  (level 208 in 2013) and has sporadically been on parenteral therapy. and Finally, patient has history of Vitamin D Deficiency in  2008 of "20" and last vitamin D was still low: Lab Results  Component Value Date   VD25OH 39 12/24/2015   Current Outpatient Prescriptions on File Prior to Visit  Medication Sig  . allopurinol  300 MG  Take 1/2 to 1 tablet daily to prevent gout  . aspirin 81 MG  Take 81 mg by mouth daily. Reported on 09/23/2015  . bisoprolol-hctz (ZIAC) 10-6.25 MG tablet TAKE 1 TABLET BY MOUTH IN THE MORNING FOR BLOOD PRESSURE  . VITAMIN D Take 10,000 Units by mouth daily.  . meloxicam  7.5 MG tablet TAKE 1 TABLET (7.5 MG TOTAL) BY MOUTH DAILY.  . metFORMIN-XR 500 MG 24 hr tablet 1 tablet PO BID  . testosterone cypio 200 MG/ML injec INJECT 2CC IM EVERY 2 WEEKS   Allergies  Allergen Reactions  . Codeine Nausea And Vomiting  . Nsaids Other (See Comments)    Bleeding ulcer 2013   Past Medical History:  Diagnosis Date  . Allergy    seasonal  . Blood transfusion 09/2011   2 units  . Hyperlipidemia   . Hypertension   . Renal disorder   . Stomach ulcer    Health Maintenance  Topic Date Due  . FOOT EXAM  03/17/1971  . OPHTHALMOLOGY EXAM  03/17/1971  . COLONOSCOPY  12/06/2014  . PNEUMOCOCCAL POLYSACCHARIDE VACCINE (2) 10/04/2015  . INFLUENZA VACCINE  12/29/2015  . URINE MICROALBUMIN  03/10/2016  .  HEMOGLOBIN A1C  06/25/2016  . TETANUS/TDAP  10/09/2021  . Hepatitis C Screening  Completed  . HIV Screening  Completed   Immunization History  Administered Date(s) Administered  . PPD Test 01/02/2014, 03/11/2015, 03/31/2016  . Pneumococcal Polysaccharide-23 10/04/2010  . Tdap 10/10/2011   Past Surgical History:  Procedure Laterality Date  . ESOPHAGOGASTRODUODENOSCOPY  10/26/2011   Procedure: ESOPHAGOGASTRODUODENOSCOPY (EGD);  Surgeon: Irene Shipper, MD;  Location: [facility] ENDOSCOPY;  Service: Endoscopy;  Laterality: N/A;  . FRACTURE SURGERY  broken collar bone   Family History  Problem Relation Age of Onset  . Adopted: Yes  . Breast cancer Mother    Social History   Social History  . Marital  status: Married    Spouse name: N/A  . Number of children: Son    Occupational History  . Sales concessions    Social History Main Topics  . Smoking status: Never Smoker  . Smokeless tobacco: Never Used  . Alcohol use 0.6 oz/week    1 Cans of beer per week  . Drug use: No  . Sexual activity: Active    ROS Constitutional: Denies fever, chills, weight loss/gain, headaches, insomnia,  night sweats or change in appetite. Does c/o fatigue. Eyes: Denies redness, blurred vision, diplopia, discharge, itchy or watery eyes.  ENT: Denies discharge, congestion, post nasal drip, epistaxis, sore throat, earache, hearing loss, dental pain, Tinnitus, Vertigo, Sinus pain or snoring.  Cardio: Denies chest pain, palpitations, irregular heartbeat, syncope, dyspnea, diaphoresis, orthopnea, PND, claudication or edema Respiratory: denies cough, dyspnea, DOE, pleurisy, hoarseness, laryngitis or wheezing.  Gastrointestinal: Denies dysphagia, heartburn, reflux, water brash, pain, cramps, nausea, vomiting, bloating, diarrhea, constipation, hematemesis, melena, hematochezia, jaundice or hemorrhoids Genitourinary: Denies dysuria, frequency, urgency, nocturia, hesitancy, discharge, hematuria or flank pain Musculoskeletal: Denies arthralgia, myalgia, stiffness, Jt. Swelling, pain, limp or strain/sprain. Denies Falls. Skin: Denies puritis, rash, hives, warts, acne, eczema or change in skin lesion Neuro: No weakness, tremor, incoordination, spasms, paresthesia or pain Psychiatric: Denies confusion, memory loss or sensory loss. Denies Depression. Endocrine: Denies change in weight, skin, hair change, nocturia, and paresthesia, diabetic polys, visual blurring or hyper / hypo glycemic episodes.  Heme/Lymph: No excessive bleeding, bruising or enlarged lymph nodes.  Physical Exam  BP (!) 142/88   Pulse 60   Temp 97.5 F (36.4 C)   Resp 16   Ht 6' (1.829 m)   Wt 281 lb (127.5 kg)   BMI 38.11 kg/m   General  Appearance: Over nourished with central Obesity and in no apparent distress.  Eyes: PERRLA, EOMs, conjunctiva no swelling or erythema, normal fundi and vessels. Sinuses: No frontal/maxillary tenderness ENT/Mouth: EACs patent / TMs  nl. Nares clear without erythema, swelling, mucoid exudates. Oral hygiene is good. No erythema, swelling, or exudate. Tongue normal, non-obstructing. Tonsils not swollen or erythematous. Hearing normal.  Neck: Supple, thyroid normal. No bruits, nodes or JVD. Respiratory: Respiratory effort normal.  BS equal and clear bilateral without rales, rhonci, wheezing or stridor. Cardio: Heart sounds are normal with regular rate and rhythm and no murmurs, rubs or gallops. Peripheral pulses are normal and equal bilaterally without edema. No aortic or femoral bruits. Chest: symmetric with normal excursions and percussion.  Abdomen: Soft, with Nl bowel sounds. Nontender, no guarding, rebound, hernias, masses, or organomegaly.  Lymphatics: Non tender without lymphadenopathy.  Genitourinary: No hernias.Testes nl. DRE - prostate nl for age - smooth & firm w/o nodules. Musculoskeletal: Full ROM all peripheral extremities, joint stability, 5/5 strength, and normal gait. Skin: Warm and  dry without rashes, lesions, cyanosis, clubbing or  ecchymosis.  Neuro: Cranial nerves intact, reflexes equal bilaterally. Normal muscle tone, no cerebellar symptoms. Sensation intact to touch, vibratory and Monofilament to the toes bilaterally.  Pysch: Alert and oriented X 3 with normal affect, insight and judgment appropriate.   Assessment and Plan  1. Annual Preventative/Screening Exam   - Microalbumin / creatinine urine ratio - EKG 12-Lead - [place], RETROPERITNL ABD,  LTD - POC Hemoccult Bld/Stl  - Urinalysis, Routine w reflex microscopic  - Vitamin B12 - Iron and TIBC - PSA - Testosterone - HM DIABETES FOOT EXAM - LOW EXTREMITY NEUR EXAM DOCUM - CBC with Differential/Platelet - BASIC  METABOLIC PANEL WITH GFR - Hepatic function panel - Magnesium - Lipid panel - TSH - Hemoglobin A1c - Insulin, random - VITAMIN D 25 Hydroxy   2. Essential hypertension  - Microalbumin / creatinine urine ratio - EKG 12-Lead - [place], RETROPERITNL ABD,  LTD - TSH  3. Mixed hyperlipidemia  - EKG 12-Lead - [place], RETROPERITNL ABD,  LTD - Lipid panel - TSH  4. T2_NIDDM (HCC)  - Microalbumin / creatinine urine ratio - EKG 12-Lead - [place], RETROPERITNL ABD,  LTD - HM DIABETES FOOT EXAM - LOW EXTREMITY NEUR EXAM DOCUM - Hemoglobin A1c - Insulin, random  5. Vitamin D deficiency  - VITAMIN D 25 Hydroxy   6. Testosterone deficiency  - Testosterone - testosterone cypio injection 400 mg; Inject 2 mLs IM  7. Idiopathic gout   8. Gastroesophageal reflux disease   9. Screening for rectal cancer  - POC Hemoccult Bld/Stl   10. Prostate cancer screening  - PSA  11. OSA (obstructive sleep apnea)   12. Screening for ischemic heart disease  - EKG 12-Lead  13. Screening for AAA (aortic abdominal aneurysm)  - [place], RETROPERITNL ABD,  LTD  14. Other fatigue  - Vitamin B12 - Iron and TIBC - Testosterone - CBC with Differential/Platelet - TSH  15. Medication management  - Urinalysis, Routine w reflex microscopic - CBC with Differential/Platelet - BASIC METABOLIC PANEL WITH GFR - Hepatic function panel - Magnesium  16. Screening examination for pulmonary tuberculosis  - PPD       Continue prudent diet as discussed, weight control, BP monitoring, regular exercise, and medications as discussed.  Discussed med effects and SE's. Routine screening labs and tests as requested with regular follow-up as recommended. Over 40 minutes of exam, counseling, chart review and high complex critical decision making was performed

## 2016-04-01 ENCOUNTER — Encounter: Payer: Self-pay | Admitting: Internal Medicine

## 2016-04-01 LAB — VITAMIN D 25 HYDROXY (VIT D DEFICIENCY, FRACTURES): Vit D, 25-Hydroxy: 52 ng/mL (ref 30–100)

## 2016-04-01 LAB — INSULIN, RANDOM: Insulin: 43.4 u[IU]/mL — ABNORMAL HIGH (ref 2.0–19.6)

## 2016-04-04 LAB — TB SKIN TEST
Induration: 0 mm
TB Skin Test: NEGATIVE

## 2016-04-07 ENCOUNTER — Other Ambulatory Visit: Payer: Self-pay | Admitting: Internal Medicine

## 2016-04-07 DIAGNOSIS — R7303 Prediabetes: Secondary | ICD-10-CM

## 2016-04-14 ENCOUNTER — Other Ambulatory Visit: Payer: Self-pay | Admitting: Internal Medicine

## 2016-04-24 ENCOUNTER — Encounter: Payer: Self-pay | Admitting: Internal Medicine

## 2016-04-24 ENCOUNTER — Other Ambulatory Visit: Payer: Self-pay | Admitting: Internal Medicine

## 2016-04-24 MED ORDER — PREDNISONE 10 MG PO TABS: ORAL_TABLET | ORAL | 0 refills | Status: DC

## 2016-04-25 ENCOUNTER — Other Ambulatory Visit: Payer: Self-pay | Admitting: Internal Medicine

## 2016-04-25 DIAGNOSIS — M1000 Idiopathic gout, unspecified site: Secondary | ICD-10-CM

## 2016-07-01 ENCOUNTER — Other Ambulatory Visit: Payer: Self-pay

## 2016-07-01 DIAGNOSIS — Z1212 Encounter for screening for malignant neoplasm of rectum: Secondary | ICD-10-CM

## 2016-07-01 DIAGNOSIS — Z0001 Encounter for general adult medical examination with abnormal findings: Secondary | ICD-10-CM

## 2016-07-01 LAB — POC HEMOCCULT BLD/STL (HOME/3-CARD/SCREEN)
Card #2 Fecal Occult Blod, POC: NEGATIVE
Card #3 Fecal Occult Blood, POC: NEGATIVE
Fecal Occult Blood, POC: NEGATIVE

## 2016-07-08 ENCOUNTER — Ambulatory Visit: Payer: Self-pay | Admitting: Internal Medicine

## 2016-07-13 ENCOUNTER — Ambulatory Visit (INDEPENDENT_AMBULATORY_CARE_PROVIDER_SITE_OTHER): Payer: BLUE CROSS/BLUE SHIELD | Admitting: Internal Medicine

## 2016-07-13 ENCOUNTER — Encounter: Payer: Self-pay | Admitting: Internal Medicine

## 2016-07-13 VITALS — BP 128/76 | HR 72 | Temp 98.2°F | Resp 18 | Ht 72.0 in | Wt 283.0 lb

## 2016-07-13 DIAGNOSIS — G4733 Obstructive sleep apnea (adult) (pediatric): Secondary | ICD-10-CM

## 2016-07-13 DIAGNOSIS — E119 Type 2 diabetes mellitus without complications: Secondary | ICD-10-CM

## 2016-07-13 DIAGNOSIS — E559 Vitamin D deficiency, unspecified: Secondary | ICD-10-CM

## 2016-07-13 DIAGNOSIS — E782 Mixed hyperlipidemia: Secondary | ICD-10-CM | POA: Diagnosis not present

## 2016-07-13 DIAGNOSIS — I10 Essential (primary) hypertension: Secondary | ICD-10-CM

## 2016-07-13 DIAGNOSIS — E291 Testicular hypofunction: Secondary | ICD-10-CM | POA: Diagnosis not present

## 2016-07-13 DIAGNOSIS — M1000 Idiopathic gout, unspecified site: Secondary | ICD-10-CM | POA: Diagnosis not present

## 2016-07-13 DIAGNOSIS — Z79899 Other long term (current) drug therapy: Secondary | ICD-10-CM

## 2016-07-13 LAB — HEPATIC FUNCTION PANEL
ALT: 51 U/L — ABNORMAL HIGH (ref 9–46)
AST: 40 U/L — ABNORMAL HIGH (ref 10–35)
Albumin: 4.4 g/dL (ref 3.6–5.1)
Alkaline Phosphatase: 55 U/L (ref 40–115)
Bilirubin, Direct: 0.1 mg/dL (ref <=0.2)
Indirect Bilirubin: 0.5 mg/dL (ref 0.2–1.2)
Total Bilirubin: 0.6 mg/dL (ref 0.2–1.2)
Total Protein: 7.3 g/dL (ref 6.1–8.1)

## 2016-07-13 LAB — BASIC METABOLIC PANEL WITH GFR
BUN: 15 mg/dL (ref 7–25)
CO2: 26 mmol/L (ref 20–31)
Calcium: 10.1 mg/dL (ref 8.6–10.3)
Chloride: 102 mmol/L (ref 98–110)
Creat: 1.04 mg/dL (ref 0.70–1.33)
GFR, Est African American: >89 mL/min (ref >=60)
GFR, Est Non African American: 80 mL/min (ref >=60)
Glucose, Bld: 214 mg/dL — ABNORMAL HIGH (ref 65–99)
Potassium: 4.1 mmol/L (ref 3.5–5.3)
Sodium: 140 mmol/L (ref 135–146)

## 2016-07-13 LAB — CBC WITH DIFFERENTIAL/PLATELET
Basophils Absolute: 75 cells/uL (ref 0–200)
Basophils Relative: 1 %
Eosinophils Absolute: 375 cells/uL (ref 15–500)
Eosinophils Relative: 5 %
HCT: 49.9 % (ref 38.5–50.0)
Hemoglobin: 17 g/dL (ref 13.2–17.1)
Lymphocytes Relative: 35 %
Lymphs Abs: 2625 cells/uL (ref 850–3900)
MCH: 32.5 pg (ref 27.0–33.0)
MCHC: 34.1 g/dL (ref 32.0–36.0)
MCV: 95.4 fL (ref 80.0–100.0)
MPV: 10 fL (ref 7.5–12.5)
Monocytes Absolute: 600 cells/uL (ref 200–950)
Monocytes Relative: 8 %
Neutro Abs: 3825 cells/uL (ref 1500–7800)
Neutrophils Relative %: 51 %
Platelets: 249 10*3/uL (ref 140–400)
RBC: 5.23 MIL/uL (ref 4.20–5.80)
RDW: 13 % (ref 11.0–15.0)
WBC: 7.5 10*3/uL (ref 3.8–10.8)

## 2016-07-13 LAB — LIPID PANEL
Cholesterol: 182 mg/dL (ref <200)
HDL: 33 mg/dL — ABNORMAL LOW (ref >40)
LDL Cholesterol: 94 mg/dL (ref <100)
Total CHOL/HDL Ratio: 5.5 Ratio — ABNORMAL HIGH (ref <5.0)
Triglycerides: 276 mg/dL — ABNORMAL HIGH (ref <150)
VLDL: 55 mg/dL — ABNORMAL HIGH (ref <30)

## 2016-07-13 LAB — TSH: TSH: 2.61 mIU/L (ref 0.40–4.50)

## 2016-07-13 MED ORDER — ATORVASTATIN CALCIUM 80 MG PO TABS: ORAL_TABLET | ORAL | 1 refills | Status: DC

## 2016-07-13 NOTE — Progress Notes (Signed)
Assessment and Plan:  Hypertension:  -Continue medication,  -monitor blood pressure at home.  -Continue DASH diet.   -Reminder to go to the ER if any CP, SOB, nausea, dizziness, severe HA, changes vision/speech, left arm numbness and tingling, and jaw pain.  Cholesterol: -needs to start lipitor given diabetic status -Continue diet and exercise.  -Check cholesterol.   Diabetes: -is diabetic and is taking metformin -not checking CBGs regularly -not exercising -not working on diet -Continue diet and exercise.  -Check A1C  Vitamin D Def: -continue medications.   Gout -cont allopurinol -discussed most common trigger foods -uric acid level -cont aggressive hydration  Hypogonadism -testosterone shot today -does not appear to be holding to shot schedule    Continue diet and meds as discussed. Further disposition pending results of labs.  HPI 56 y.o. male  presents for 3 month follow up with hypertension, hyperlipidemia, prediabetes and vitamin D.   His blood pressure has been controlled at home, today their BP is  .   He does not workout. He denies chest pain, shortness of breath, dizziness.   He is not on cholesterol medication and denies myalgias. His cholesterol is at goal. The cholesterol last visit was:   Lab Results  Component Value Date   CHOL 192 03/31/2016   HDL 40 03/31/2016   LDLCALC 121 03/31/2016   TRIG 153 (H) 03/31/2016   CHOLHDL 4.8 03/31/2016     He has been working on diet and exercise for prediabetes, and denies foot ulcerations, hyperglycemia, hypoglycemia , increased appetite, nausea, paresthesia of the feet, polydipsia, polyuria, visual disturbances, vomiting and weight loss. Last A1C in the office was:  Lab Results  Component Value Date   HGBA1C 6.9 (H) 03/31/2016    Patient is on Vitamin D supplement.  Lab Results  Component Value Date   VD25OH 60 03/31/2016     He reports that he has been doing well with his gout.  He reports that he is  not eating high protein and is not drinking much alcohol.  He does like to eat hard cheese.  He tries to drink plenty of water.  He has not recently had a testosterone shot.  His last recorded shot was in November at his physical.  He would like one today.  He is not exercising or working on his diet.  He reports that he wants [place] to yell at home.     Current Medications:  Current Outpatient Prescriptions on File Prior to Visit  Medication Sig Dispense Refill  . allopurinol (ZYLOPRIM) 300 MG tablet TAKE 1/2 TO 1 TABLET DAILY TO PREVENT GOUT 90 tablet 1  . aspirin 81 MG tablet Take 81 mg by mouth daily. Reported on 09/23/2015    . atorvastatin (LIPITOR) 80 MG tablet Take 1/2 to 1 tablet daily for Cholesterol or as directed 90 tablet 1  . bisoprolol-hydrochlorothiazide (ZIAC) 10-6.25 MG tablet TAKE 1 TABLET BY MOUTH IN THE MORNING FOR BLOOD PRESSURE 90 tablet 2  . Cholecalciferol (VITAMIN D PO) Take 10,000 Units by mouth daily.    . meloxicam (MOBIC) 7.5 MG tablet TAKE 1 TABLET (7.5 MG TOTAL) BY MOUTH DAILY. 90 tablet 99  . metFORMIN (GLUCOPHAGE-XR) 500 MG 24 hr tablet Take 1 to 2 tablets  TWICE /Daily as directed for Diabetes 360 tablet 1  . predniSONE (DELTASONE) 10 MG tablet 1 tab 3 x day for 2 days, then 1 tab 2 x day for 2 days, then 1 tab 1 x day for 3 days  13 tablet 0  . sildenafil (REVATIO) 20 MG tablet TAKE 1-5 TABS EVERY DAY AS NEEDED 90 tablet 11   Current Facility-Administered Medications on File Prior to Visit  Medication Dose Route Frequency Provider Last Rate Last Dose  . testosterone cypionate (DEPOTESTOSTERONE CYPIONATE) injection 200 mg  200 mg Intramuscular Q14 Days Vicie Mutters, PA-C   200 mg at 02/04/16 0908    Medical History:  Past Medical History:  Diagnosis Date  . Allergy    seasonal  . Blood transfusion 09/2011   2 units  . Hyperlipidemia   . Hypertension   . Renal disorder   . Stomach ulcer     Allergies:  Allergies  Allergen Reactions  . Codeine  Nausea And Vomiting  . Nsaids Other (See Comments)    Bleeding ulcer 2013     Review of Systems:  Review of Systems  Constitutional: Negative for chills, fever and malaise/fatigue.  HENT: Negative for congestion, ear pain and sore throat.   Eyes: Negative.   Respiratory: Negative for cough, shortness of breath and wheezing.   Cardiovascular: Negative for chest pain, palpitations and leg swelling.  Gastrointestinal: Negative for abdominal pain, blood in stool, constipation, diarrhea, heartburn and melena.  Genitourinary: Negative.   Skin: Negative.   Neurological: Negative for dizziness, sensory change, loss of consciousness and headaches.  Psychiatric/Behavioral: Negative for depression. The patient is not nervous/anxious and does not have insomnia.     Family history- Review and unchanged  Social history- Review and unchanged  Physical Exam: Resp 18   Ht 6' (1.829 m)   Wt 283 lb (128.4 kg)   BMI 38.38 kg/m  Wt Readings from Last 3 Encounters:  07/13/16 283 lb (128.4 kg)  03/31/16 281 lb (127.5 kg)  12/24/15 289 lb 3.2 oz (131.2 kg)    General Appearance: Well nourished well developed, in no apparent distress. Eyes: PERRLA, EOMs, conjunctiva no swelling or erythema ENT/Mouth: Ear canals normal without obstruction, swelling, erythma, discharge.  TMs normal bilaterally.  Oropharynx moist, clear, without exudate, or postoropharyngeal swelling. Neck: Supple, thyroid normal,no cervical adenopathy  Respiratory: Respiratory effort normal, Breath sounds clear A&P without rhonchi, wheeze, or rale.  No retractions, no accessory usage. Cardio: RRR with no MRGs. Brisk peripheral pulses without edema.  Abdomen: Soft, + BS,  Non tender, no guarding, rebound, hernias, masses. Musculoskeletal: Full ROM, 5/5 strength, Normal gait Skin: Warm, dry without rashes, lesions, ecchymosis.  Neuro: Awake and oriented X 3, Cranial nerves intact. Normal muscle tone, no cerebellar symptoms. Psych:  Normal affect, Insight and Judgment appropriate.    Starlyn Skeans, PA-C 10:10 AM [facility]

## 2016-07-14 LAB — URIC ACID: Uric Acid, Serum: 6.1 mg/dL (ref 4.0–8.0)

## 2016-07-14 LAB — HEMOGLOBIN A1C
Hgb A1c MFr Bld: 6.3 % — ABNORMAL HIGH (ref <5.7)
Mean Plasma Glucose: 134 mg/dL

## 2016-07-28 ENCOUNTER — Other Ambulatory Visit: Payer: Self-pay | Admitting: Internal Medicine

## 2016-07-28 DIAGNOSIS — G4733 Obstructive sleep apnea (adult) (pediatric): Secondary | ICD-10-CM

## 2016-10-14 ENCOUNTER — Ambulatory Visit (INDEPENDENT_AMBULATORY_CARE_PROVIDER_SITE_OTHER): Payer: BLUE CROSS/BLUE SHIELD | Admitting: Internal Medicine

## 2016-10-14 ENCOUNTER — Encounter: Payer: Self-pay | Admitting: Internal Medicine

## 2016-10-14 VITALS — BP 152/108 | HR 98 | Temp 98.0°F | Resp 12 | Ht 72.0 in | Wt 282.0 lb

## 2016-10-14 DIAGNOSIS — Z6838 Body mass index (BMI) 38.0-38.9, adult: Secondary | ICD-10-CM

## 2016-10-14 DIAGNOSIS — E782 Mixed hyperlipidemia: Secondary | ICD-10-CM

## 2016-10-14 DIAGNOSIS — E559 Vitamin D deficiency, unspecified: Secondary | ICD-10-CM

## 2016-10-14 DIAGNOSIS — R7303 Prediabetes: Secondary | ICD-10-CM

## 2016-10-14 DIAGNOSIS — I10 Essential (primary) hypertension: Secondary | ICD-10-CM

## 2016-10-14 DIAGNOSIS — E349 Endocrine disorder, unspecified: Secondary | ICD-10-CM | POA: Diagnosis not present

## 2016-10-14 DIAGNOSIS — M1000 Idiopathic gout, unspecified site: Secondary | ICD-10-CM | POA: Diagnosis not present

## 2016-10-14 DIAGNOSIS — Z79899 Other long term (current) drug therapy: Secondary | ICD-10-CM | POA: Diagnosis not present

## 2016-10-14 LAB — CBC WITH DIFFERENTIAL/PLATELET
Basophils Absolute: 0 cells/uL (ref 0–200)
Basophils Relative: 0 %
Eosinophils Absolute: 305 cells/uL (ref 15–500)
Eosinophils Relative: 5 %
HCT: 48.8 % (ref 38.5–50.0)
Hemoglobin: 16.2 g/dL (ref 13.2–17.1)
Lymphocytes Relative: 36 %
Lymphs Abs: 2196 cells/uL (ref 850–3900)
MCH: 31.1 pg (ref 27.0–33.0)
MCHC: 33.2 g/dL (ref 32.0–36.0)
MCV: 93.7 fL (ref 80.0–100.0)
MPV: 9.8 fL (ref 7.5–12.5)
Monocytes Absolute: 549 cells/uL (ref 200–950)
Monocytes Relative: 9 %
Neutro Abs: 3050 cells/uL (ref 1500–7800)
Neutrophils Relative %: 50 %
Platelets: 205 10*3/uL (ref 140–400)
RBC: 5.21 MIL/uL (ref 4.20–5.80)
RDW: 13 % (ref 11.0–15.0)
WBC: 6.1 10*3/uL (ref 3.8–10.8)

## 2016-10-14 LAB — BASIC METABOLIC PANEL WITH GFR
BUN: 15 mg/dL (ref 7–25)
CO2: 23 mmol/L (ref 20–31)
Calcium: 9.3 mg/dL (ref 8.6–10.3)
Chloride: 103 mmol/L (ref 98–110)
Creat: 1.08 mg/dL (ref 0.70–1.33)
GFR, Est African American: 89 mL/min (ref >=60)
GFR, Est Non African American: 77 mL/min (ref >=60)
Glucose, Bld: 242 mg/dL — ABNORMAL HIGH (ref 65–99)
Potassium: 4.2 mmol/L (ref 3.5–5.3)
Sodium: 139 mmol/L (ref 135–146)

## 2016-10-14 LAB — HEPATIC FUNCTION PANEL
ALT: 58 U/L — ABNORMAL HIGH (ref 9–46)
AST: 36 U/L — ABNORMAL HIGH (ref 10–35)
Albumin: 4.3 g/dL (ref 3.6–5.1)
Alkaline Phosphatase: 52 U/L (ref 40–115)
Bilirubin, Direct: 0.2 mg/dL (ref <=0.2)
Indirect Bilirubin: 0.6 mg/dL (ref 0.2–1.2)
Total Bilirubin: 0.8 mg/dL (ref 0.2–1.2)
Total Protein: 6.9 g/dL (ref 6.1–8.1)

## 2016-10-14 NOTE — Progress Notes (Signed)
This very nice 57 y.o. MWM presents for 6 month follow up with Hypertension, Hyperlipidemia, Pre-Diabetes and Vitamin D Deficiency. He also has Gout controlled on Allopurinol. Patient is in CPAP for OSA     Patient is treated for HTN (2001) & BP has been controlled at home. Today's BP is elevated at 152/108 rechecked. Patient has had no complaints of any cardiac type chest pain, palpitations, dyspnea/orthopnea/PND, dizziness, claudication, or dependent edema.     Hyperlipidemia is controlled with diet & meds. Patient denies myalgias or other med SE's. Last Lipids were at goal albeit elevated Trig's: Lab Results  Component Value Date   CHOL 182 07/13/2016   HDL 33 (L) 07/13/2016   LDLCALC 94 07/13/2016   TRIG 276 (H) 07/13/2016   CHOLHDL 5.5 (H) 07/13/2016      Also, the patient has history of Morbid Obesity (BMI 38+) and PreDM with insulin Resistance in 2009 and in Feb/2016 he was started on Metformin.   He denies symptoms of reactive hypoglycemia, diabetic polys, paresthesias or visual blurring.  Last A1c was still not at goal: Lab Results  Component Value Date   HGBA1C 6.3 (H) 07/13/2016      He was found to have a low Testosterone Level of 208 and has been on Depo-Testosterone since 2009.     Further, the patient also has history of Vitamin D Deficiency ("20" in 2008) and supplements vitamin D without any suspected side-effects. Last vitamin D was still not at goal Lab Results  Component Value Date   VD25OH 52 03/31/2016   Current Outpatient Prescriptions on File Prior to Visit  Medication Sig  . allopurinol  300 MG tablet TAKE 1/2 TO 1 TABLET DAILY TO PREVENT GOUT  . aspirin 81 MG tablet Take 81 mg by mouth daily. Reported on 09/23/2015  . Atorvastatin 80 MG tablet Take 1/2 to 1 tablet daily for Cholesterol or as directed  . bisoprolol-hydrochlorothiazide 10-6.25 MG tablet TAKE 1 TABLET BY MOUTH IN THE MORNING FOR BLOOD PRESSURE  . VITAMIN D Take 10,000 Units by mouth daily.    . meloxicam  7.5 MG tablet TAKE 1 TABLET (7.5 MG TOTAL) BY MOUTH DAILY.  . metFORMIN-XR 500 MG  Take 1 to 2 tablets  TWICE /Daily as directed for Diabetes  . sildenafil  20 MG tablet TAKE 1-5 TABS EVERY DAY AS NEEDED   Allergies  Allergen Reactions  . Codeine Nausea And Vomiting  . Nsaids Other (See Comments)    Bleeding ulcer 2013   PMHx:   Past Medical History:  Diagnosis Date  . Allergy    seasonal  . Blood transfusion 09/2011   2 units  . Hyperlipidemia   . Hypertension   . Renal disorder   . Stomach ulcer    Immunization History  Administered Date(s) Administered  . PPD Test 01/02/2014, 03/11/2015, 03/31/2016  . Pneumococcal Polysaccharide-23 10/04/2010  . Tdap 10/10/2011   Past Surgical History:  Procedure Laterality Date  . ESOPHAGOGASTRODUODENOSCOPY  10/26/2011   Procedure: ESOPHAGOGASTRODUODENOSCOPY (EGD);  Surgeon: Irene Shipper, MD;  Location: [facility] ENDOSCOPY;  Service: Endoscopy;  Laterality: N/A;  . FRACTURE SURGERY  broken collar bone   FHx:    Reviewed / unchanged  SHx:    Reviewed / unchanged  Systems Review:  Constitutional: Denies fever, chills, wt changes, headaches, insomnia, fatigue, night sweats, change in appetite. Eyes: Denies redness, blurred vision, diplopia, discharge, itchy, watery eyes.  ENT: Denies discharge, congestion, post nasal drip, epistaxis, sore throat,  earache, hearing loss, dental pain, tinnitus, vertigo, sinus pain, snoring.  CV: Denies chest pain, palpitations, irregular heartbeat, syncope, dyspnea, diaphoresis, orthopnea, PND, claudication or edema. Respiratory: denies cough, dyspnea, DOE, pleurisy, hoarseness, laryngitis, wheezing.  Gastrointestinal: Denies dysphagia, odynophagia, heartburn, reflux, water brash, abdominal pain or cramps, nausea, vomiting, bloating, diarrhea, constipation, hematemesis, melena, hematochezia  or hemorrhoids. Genitourinary: Denies dysuria, frequency, urgency, nocturia, hesitancy, discharge, hematuria or  flank pain. Musculoskeletal: Denies arthralgias, myalgias, stiffness, jt. swelling, pain, limping or strain/sprain.  Skin: Denies pruritus, rash, hives, warts, acne, eczema or change in skin lesion(s). Neuro: No weakness, tremor, incoordination, spasms, paresthesia or pain. Psychiatric: Denies confusion, memory loss or sensory loss. Endo: Denies change in weight, skin or hair change.  Heme/Lymph: No excessive bleeding, bruising or enlarged lymph nodes.  Physical Exam  BP (!) 152/108   Pulse 98   Temp 98 F (36.7 C)   Resp 12   Ht 6' (1.829 m)   Wt 282 lb (127.9 kg)   BMI 38.25 kg/m   Appears overnourished, well groomed  and in no distress.  Eyes: PERRLA, EOMs, conjunctiva no swelling or erythema. Sinuses: No frontal/maxillary tenderness ENT/Mouth: EAC's clear, TM's nl w/o erythema, bulging. Nares clear w/o erythema, swelling, exudates. Oropharynx clear without erythema or exudates. Oral hygiene is good. Tongue normal, non obstructing. Hearing intact.  Neck: Supple. Thyroid nl. Car 2+/2+ without bruits, nodes or JVD. Chest: Respirations nl with BS clear & equal w/o rales, rhonchi, wheezing or stridor.  Cor: Heart sounds normal w/ regular rate and rhythm without sig. murmurs, gallops, clicks or rubs. Peripheral pulses normal and equal  without edema.  Abdomen: Soft & bowel sounds normal. Non-tender w/o guarding, rebound, hernias, masses or organomegaly.  Lymphatics: Unremarkable.  Musculoskeletal: Full ROM all peripheral extremities, joint stability, 5/5 strength and normal gait.  Skin: Warm, dry without exposed rashes, lesions or ecchymosis apparent.  Neuro: Cranial nerves intact, reflexes equal bilaterally. Sensory-motor testing grossly intact. Tendon reflexes grossly intact.  Pysch: Alert & oriented x 3.  Insight and judgement nl & appropriate. No ideations.  Assessment and Plan:  1. Essential hypertension  - Continue medication, monitor blood pressure at home.  - Continue  DASH diet. Reminder to go to the ER if any CP,  SOB, nausea, dizziness, severe HA, changes vision/speech,  left arm numbness and tingling and jaw pain.  - CBC with Differential/Platelet - BASIC METABOLIC PANEL WITH GFR - Magnesium - TSH  2. Hyperlipidemia, mixed  - Continue diet/meds, exercise,& lifestyle modifications.  - Continue monitor periodic cholesterol/liver & renal functions   - Hepatic function panel - TSH  3. Prediabetes  - Continue diet, exercise, lifestyle modifications.  - Monitor appropriate labs.  - Hemoglobin A1c - Insulin, random  4. Vitamin D deficiency  - Continue supplementation.    - VITAMIN D 25 Hydroxy   5. Class 2 severe obesity due to excess calories with serious comorbidity and body mass index (BMI) of 38.0 to 38.9 in adult ([place])   6. Idiopathic gout  - Uric acid  7. Testosterone deficiency  - Testosterone  8. Medication management  - CBC with Differential/Platelet - BASIC METABOLIC PANEL WITH GFR - Hepatic function panel - Magnesium - TSH - Hemoglobin A1c - Insulin, random - VITAMIN D 25 Hydroxy  - Uric acid - Testosterone    Discussed  regular exercise, BP monitoring, weight control to achieve/maintain BMI less than 25 and discussed med and SE's. Recommended labs to assess and monitor clinical status with further disposition pending  results of labs. Over 30 minutes of exam, counseling, chart review was performed.

## 2016-10-14 NOTE — Patient Instructions (Signed)

## 2016-10-15 LAB — TSH: TSH: 2.42 mIU/L (ref 0.40–4.50)

## 2016-10-15 LAB — HEMOGLOBIN A1C
Hgb A1c MFr Bld: 6.7 % — ABNORMAL HIGH
Mean Plasma Glucose: 146 mg/dL

## 2016-10-15 LAB — MAGNESIUM: Magnesium: 1.8 mg/dL (ref 1.5–2.5)

## 2016-10-15 LAB — VITAMIN D 25 HYDROXY (VIT D DEFICIENCY, FRACTURES): Vit D, 25-Hydroxy: 53 ng/mL (ref 30–100)

## 2016-10-15 LAB — TESTOSTERONE: Testosterone: 264 ng/dL (ref 250–827)

## 2016-10-15 LAB — URIC ACID: Uric Acid, Serum: 7.1 mg/dL (ref 4.0–8.0)

## 2016-10-17 LAB — INSULIN, RANDOM: Insulin: 120.1 u[IU]/mL — ABNORMAL HIGH (ref 2.0–19.6)

## 2016-10-25 ENCOUNTER — Other Ambulatory Visit: Payer: Self-pay

## 2016-10-25 MED ORDER — BISOPROLOL-HYDROCHLOROTHIAZIDE 10-6.25 MG PO TABS: ORAL_TABLET | ORAL | 1 refills | Status: DC

## 2016-11-21 ENCOUNTER — Other Ambulatory Visit: Payer: Self-pay | Admitting: Internal Medicine

## 2016-11-21 DIAGNOSIS — M1000 Idiopathic gout, unspecified site: Secondary | ICD-10-CM

## 2017-01-27 NOTE — Progress Notes (Signed)
Assessment and Plan:   Essential hypertension - continue medications, DASH diet, exercise and monitor at home. Call if greater than 130/80.  -     CBC with Differential/Platelet -     BASIC METABOLIC PANEL WITH GFR -     Hepatic function panel -     TSH  T2_NIDDM ([place]) Discussed general issues about diabetes pathophysiology and management., Educational material distributed., Suggested low cholesterol diet., Encouraged aerobic exercise., Discussed foot care., Reminded to get yearly retinal exam. - discussed adding in statin and ace, wants to try diet and if still in DM range will add next OV -     Hemoglobin A1c  Mixed hyperlipidemia -continue medications, check lipids, decrease fatty foods, increase activity.  -     Lipid panel  Medication management -     Magnesium  Testosterone deficiency Hypogonadism- continue replacement therapy, check testosterone levels as needed, weight loss advised  Class 2 severe obesity due to excess calories with serious comorbidity and body mass index (BMI) of 38.0 to 38.9 in adult [facility]) - long discussion about weight loss, diet, and exercise  Continue diet and meds as discussed. Further disposition pending results of labs.  HPI 56 y.o. male  presents for 3 month follow up with hypertension, hyperlipidemia, prediabetes and vitamin D.  His blood pressure has been controlled at home, never checks at home, today their BP is BP: 132/80  He does not workout but does a lot of walked. He denies chest pain, shortness of breath, dizziness.  He is not on cholesterol medication, suggested getting on lipitor but never started. His cholesterol is at goal. The cholesterol last visit was:   Lab Results  Component Value Date   CHOL 182 07/13/2016   HDL 33 (L) 07/13/2016   LDLCALC 94 07/13/2016   TRIG 276 (H) 07/13/2016   CHOLHDL 5.5 (H) 07/13/2016    He has been working on diet and exercise for diabetes, he is on metformin 3-4 a day, and denies paresthesia of  the feet, polydipsia, polyuria and visual disturbances. Last A1C in the office was:  Lab Results  Component Value Date   HGBA1C 6.7 (H) 10/14/2016   Patient is on Vitamin D supplement.   Lab Results  Component Value Date   VD25OH 53 10/14/2016    BMI is Body mass index is 38.27 kg/m., he is working on diet and exercise Wt Readings from Last 7 Encounters:  01/31/17 282 lb 3.2 oz (128 kg)  10/14/16 282 lb (127.9 kg)  07/13/16 283 lb (128.4 kg)  03/31/16 281 lb (127.5 kg)  12/24/15 289 lb 3.2 oz (131.2 kg)  09/23/15 285 lb 3.2 oz (129.4 kg)  06/26/15 284 lb (128.8 kg)    Current Medications:  Current Outpatient Prescriptions on File Prior to Visit  Medication Sig Dispense Refill  . allopurinol (ZYLOPRIM) 300 MG tablet TAKE 1/2 TO 1 TABLET DAILY TO PREVENT GOUT 90 tablet 1  . aspirin 81 MG tablet Take 81 mg by mouth daily. Reported on 09/23/2015    . atorvastatin (LIPITOR) 80 MG tablet Take 1/2 to 1 tablet daily for Cholesterol or as directed 90 tablet 1  . bisoprolol-hydrochlorothiazide (ZIAC) 10-6.25 MG tablet TAKE 1 TABLET BY MOUTH IN THE MORNING FOR BLOOD PRESSURE 90 tablet 1  . Cholecalciferol (VITAMIN D PO) Take 10,000 Units by mouth daily.    . meloxicam (MOBIC) 7.5 MG tablet TAKE 1 TABLET (7.5 MG TOTAL) BY MOUTH DAILY. 90 tablet 99  . metFORMIN (GLUCOPHAGE-XR) 500 MG  24 hr tablet Take 1 to 2 tablets  TWICE /Daily as directed for Diabetes 360 tablet 1  . sildenafil (REVATIO) 20 MG tablet TAKE 1-5 TABS EVERY DAY AS NEEDED 90 tablet 11   No current facility-administered medications on file prior to visit.    Medical History:  Past Medical History:  Diagnosis Date  . Allergy    seasonal  . Blood transfusion 09/2011   2 units  . Hyperlipidemia   . Hypertension   . Renal disorder   . Stomach ulcer    Allergies:  Allergies  Allergen Reactions  . Codeine Nausea And Vomiting  . Nsaids Other (See Comments)    Bleeding ulcer 2013     Review of Systems:  Review of  Systems  Constitutional: Negative for chills, diaphoresis, fever, malaise/fatigue and weight loss.  HENT: Negative.   Respiratory: Negative.   Cardiovascular: Negative.   Gastrointestinal: Negative.   Genitourinary: Negative.   Musculoskeletal: Negative.   Skin: Negative.   Neurological: Negative.  Negative for weakness.  Psychiatric/Behavioral: Negative.     Family history- Review and unchanged Social history- Review and unchanged Physical Exam: BP 132/80   Pulse 85   Temp 97.9 F (36.6 C)   Resp 16   Ht 6' (1.829 m)   Wt 282 lb 3.2 oz (128 kg)   SpO2 98%   BMI 38.27 kg/m  Wt Readings from Last 3 Encounters:  01/31/17 282 lb 3.2 oz (128 kg)  10/14/16 282 lb (127.9 kg)  07/13/16 283 lb (128.4 kg)   General Appearance: Well nourished, in no apparent distress. Eyes: PERRLA, EOMs, conjunctiva no swelling or erythema Sinuses: No Frontal/maxillary tenderness ENT/Mouth: Ext aud canals clear, TMs without erythema, bulging. No erythema, swelling, or exudate on post pharynx.  Tonsils not swollen or erythematous. Hearing normal.  Neck: Supple, thyroid normal. Crowded mouth Respiratory: Respiratory effort normal, BS equal bilaterally without rales, rhonchi, wheezing or stridor.  Cardio: RRR with no MRGs. Brisk peripheral pulses without edema.  Abdomen: Soft, + BS, obese  Non tender, no guarding, rebound, hernias, masses. Lymphatics: Non tender without lymphadenopathy.  Musculoskeletal: Full ROM, 5/5 strength, Normal gait Skin: Warm, dry without rashes, lesions, ecchymosis.  Neuro: Cranial nerves intact. Normal muscle tone, no cerebellar symptoms. Psych: Awake and oriented X 3, normal affect, Insight and Judgment appropriate.    Vicie Mutters, PA-C 9:47 AM [facility]

## 2017-01-31 ENCOUNTER — Ambulatory Visit (INDEPENDENT_AMBULATORY_CARE_PROVIDER_SITE_OTHER): Payer: BLUE CROSS/BLUE SHIELD | Admitting: Physician Assistant

## 2017-01-31 ENCOUNTER — Encounter: Payer: Self-pay | Admitting: Physician Assistant

## 2017-01-31 VITALS — BP 132/80 | HR 85 | Temp 97.9°F | Resp 16 | Ht 72.0 in | Wt 282.2 lb

## 2017-01-31 DIAGNOSIS — I10 Essential (primary) hypertension: Secondary | ICD-10-CM | POA: Diagnosis not present

## 2017-01-31 DIAGNOSIS — E782 Mixed hyperlipidemia: Secondary | ICD-10-CM

## 2017-01-31 DIAGNOSIS — E349 Endocrine disorder, unspecified: Secondary | ICD-10-CM

## 2017-01-31 DIAGNOSIS — Z79899 Other long term (current) drug therapy: Secondary | ICD-10-CM | POA: Diagnosis not present

## 2017-01-31 DIAGNOSIS — Z6838 Body mass index (BMI) 38.0-38.9, adult: Secondary | ICD-10-CM | POA: Diagnosis not present

## 2017-01-31 DIAGNOSIS — E119 Type 2 diabetes mellitus without complications: Secondary | ICD-10-CM

## 2017-01-31 NOTE — Patient Instructions (Signed)
Diabetes is a very complicated disease...lets simplify it.  An easy way to look at it to understand the complications is if you think of the extra sugar floating in your blood stream as glass shards floating through your blood stream.    Diabetes affects your small vessels first: 1) The glass shards (sugar) scraps down the tiny blood vessels in your eyes and lead to diabetic retinopathy, the leading cause of blindness in the [place]. Diabetes is the leading cause of newly diagnosed adult (20 to 56 years of age) blindness in the [place].  2) The glass shards scratches down the tiny vessels of your legs leading to nerve damage called neuropathy and can lead to amputations of your feet. More than 60% of all non-traumatic amputations of lower limbs occur in people with diabetes.  3) Over time the small vessels in your brain are shredded and closed off, individually this does not cause any problems but over a long period of time many of the small vessels being blocked can lead to Vascular Dementia.   4) Your kidney's are a filter system and have a "net" that keeps certain things in the body and lets bad things out. Sugar shreds this net and leads to kidney damage and eventually failure. Decreasing the sugar that is destroying the net and certain blood pressure medications can help stop or decrease progression of kidney disease. Diabetes was the primary cause of kidney failure in 44 percent of all new cases in 2011.  5) Diabetes also destroys the small vessels in your penis that lead to erectile dysfunction. Eventually the vessels are so damaged that you may not be responsive to cialis or viagra.   Diabetes and your large vessels: Your larger vessels consist of your coronary arteries in your heart and the carotid vessels to your brain. Diabetes or even increased sugars put you at 300% increased risk of heart attack and stroke and this is why.. The sugar scrapes down your large blood vessels and your body  sees this as an internal injury and tries to repair itself. Just like you get a scab on your skin, your platelets will stick to the blood vessel wall trying to heal it. This is why we have diabetics on low dose aspirin daily, this prevents the platelets from sticking and can prevent plaque formation. In addition, your body takes cholesterol and tries to shove it into the open wound. This is why we want your LDL, or bad cholesterol, below 70.   The combination of platelets and cholesterol over 5-10 years forms plaque that can break off and cause a heart attack or stroke.   PLEASE REMEMBER:  Diabetes is preventable! Up to 85 percent of complications and morbidities among individuals with type 2 diabetes can be prevented, delayed, or effectively treated and minimized with regular visits to a health professional, appropriate monitoring and medication, and a healthy diet and lifestyle.     Bad carbs also include fruit juice, alcohol, and sweet tea. These are empty calories that do not signal to your brain that you are full.   Please remember the good carbs are still carbs which convert into sugar. So please measure them out no more than 1/2-1 cup of rice, oatmeal, pasta, and beans  Veggies are however free foods! Pile them on.   Not all fruit is created equal. Please see the list below, the fruit at the bottom is higher in sugars than the fruit at the top. Please avoid all dried fruits.       Drink 80-100 oz a day of water, measure it out Eat 3 meals a day, have to do breakfast, eat protein- hard boiled eggs, protein bar like [place] protein bar, greek yogurt like oikos triple zero, chobani 100, or light n fit greek  We want weight loss that will last so you should lose 1-2 pounds a week.  THAT IS IT! Please pick THREE things a month to change. Once it is a habit check off the item. Then pick another three items off the list to become habits.  If you are already doing a habit on the list GREAT!   Cross that item off! o Don't drink your calories. Ie, alcohol, soda, fruit juice, and sweet tea.  o Drink more water. Drink a glass when you feel hungry or before each meal.  o Eat breakfast - Complex carb and protein (likeDannon light and fit yogurt, oatmeal, fruit, eggs, [place] bacon). o Measure your cereal.  Eat no more than one cup a day. (ie [place]) o Eat an apple a day. o Add a vegetable a day. o Try a new vegetable a month. o Use Pam! Stop using oil or butter to cook. o Don't finish your plate or use smaller plates. o Share your dessert. o Eat sugar free Jello for dessert or frozen grapes. o Don't eat 2-3 hours before bed. o Switch to whole wheat bread, pasta, and brown rice. o Make healthier choices when you eat out. No fries! o Pick baked chicken, NOT fried. o Don't forget to SLOW DOWN when you eat. It is not going anywhere.  o Take the stairs. o Park far away in the parking lot o [employer] (or weights) for 10 minutes while watching TV. o Walk at work for 10 minutes during break. o Walk outside 1 time a week with your friend, kids, dog, or significant other. o Start a walking group at [place] the mall as much as you can tolerate.  o Keep a food diary. o Weigh yourself daily. o Walk for 15 minutes 3 days per week. o Cook at home more often and eat out less.  If life happens and you go back to old habits, it is okay.  Just start over. You can do it!   If you experience chest pain, get short of breath, or tired during the exercise, please stop immediately and inform your doctor.

## 2017-02-01 LAB — BASIC METABOLIC PANEL WITH GFR
BUN: 15 mg/dL (ref 7–25)
CO2: 26 mmol/L (ref 20–32)
Calcium: 10.2 mg/dL (ref 8.6–10.3)
Chloride: 103 mmol/L (ref 98–110)
Creat: 1.17 mg/dL (ref 0.70–1.33)
GFR, Est African American: 81 mL/min/{1.73_m2} (ref >=60)
GFR, Est Non African American: 70 mL/min/{1.73_m2} (ref >=60)
Glucose, Bld: 157 mg/dL — ABNORMAL HIGH (ref 65–99)
Potassium: 4.6 mmol/L (ref 3.5–5.3)
Sodium: 140 mmol/L (ref 135–146)

## 2017-02-01 LAB — CBC WITH DIFFERENTIAL/PLATELET
Basophils Absolute: 53 cells/uL (ref 0–200)
Basophils Relative: 0.7 %
Eosinophils Absolute: 334 cells/uL (ref 15–500)
Eosinophils Relative: 4.4 %
HCT: 49.7 % (ref 38.5–50.0)
Hemoglobin: 17.2 g/dL — ABNORMAL HIGH (ref 13.2–17.1)
Lymphs Abs: 2569 cells/uL (ref 850–3900)
MCH: 31.9 pg (ref 27.0–33.0)
MCHC: 34.6 g/dL (ref 32.0–36.0)
MCV: 92.2 fL (ref 80.0–100.0)
MPV: 9.8 fL (ref 7.5–12.5)
Monocytes Relative: 8.5 %
Neutro Abs: 3998 cells/uL (ref 1500–7800)
Neutrophils Relative %: 52.6 %
Platelets: 222 10*3/uL (ref 140–400)
RBC: 5.39 10*6/uL (ref 4.20–5.80)
RDW: 12 % (ref 11.0–15.0)
Total Lymphocyte: 33.8 %
WBC mixed population: 646 cells/uL (ref 200–950)
WBC: 7.6 10*3/uL (ref 3.8–10.8)

## 2017-02-01 LAB — LIPID PANEL
Cholesterol: 200 mg/dL — ABNORMAL HIGH (ref <200)
HDL: 44 mg/dL (ref >40)
LDL Cholesterol (Calc): 125 mg/dL (calc) — ABNORMAL HIGH
Non-HDL Cholesterol (Calc): 156 mg/dL (calc) — ABNORMAL HIGH (ref <130)
Total CHOL/HDL Ratio: 4.5 (calc) (ref <5.0)
Triglycerides: 192 mg/dL — ABNORMAL HIGH (ref <150)

## 2017-02-01 LAB — HEPATIC FUNCTION PANEL
AG Ratio: 1.7 (calc) (ref 1.0–2.5)
ALT: 56 U/L — ABNORMAL HIGH (ref 9–46)
AST: 35 U/L (ref 10–35)
Albumin: 4.6 g/dL (ref 3.6–5.1)
Alkaline phosphatase (APISO): 56 U/L (ref 40–115)
Bilirubin, Direct: 0.2 mg/dL (ref 0.0–0.2)
Globulin: 2.7 g/dL (calc) (ref 1.9–3.7)
Indirect Bilirubin: 0.7 mg/dL (calc) (ref 0.2–1.2)
Total Bilirubin: 0.9 mg/dL (ref 0.2–1.2)
Total Protein: 7.3 g/dL (ref 6.1–8.1)

## 2017-02-01 LAB — HEMOGLOBIN A1C
Hgb A1c MFr Bld: 6.8 % of total Hgb — ABNORMAL HIGH (ref <5.7)
Mean Plasma Glucose: 148 (calc)
eAG (mmol/L): 8.2 (calc)

## 2017-02-01 LAB — MAGNESIUM: Magnesium: 1.9 mg/dL (ref 1.5–2.5)

## 2017-02-01 LAB — TSH: TSH: 1.95 mIU/L (ref 0.40–4.50)

## 2017-04-11 ENCOUNTER — Encounter: Payer: Self-pay | Admitting: Internal Medicine

## 2017-04-11 ENCOUNTER — Ambulatory Visit: Payer: BLUE CROSS/BLUE SHIELD | Admitting: Internal Medicine

## 2017-04-11 VITALS — BP 122/86 | HR 88 | Temp 97.2°F | Resp 18 | Ht 72.0 in | Wt 279.2 lb

## 2017-04-11 DIAGNOSIS — D509 Iron deficiency anemia, unspecified: Secondary | ICD-10-CM

## 2017-04-11 DIAGNOSIS — G2581 Restless legs syndrome: Secondary | ICD-10-CM

## 2017-04-11 MED ORDER — ROPINIROLE HCL 1 MG PO TABS: ORAL_TABLET | ORAL | 0 refills | Status: DC

## 2017-04-11 NOTE — Progress Notes (Signed)
This very nice 56 y.o. MWM with HTN, HLD, Pre-DM and Vit D Deficiency who presents with  Prodrome of uncomfortable sensations in thighs, legs & arms described as tingling, crawling, burning and feeling of need to get up from his bed and move around. There sensations are interfering with his sleep. As a result he feels exhausted during the day.      Patient is treated for HTN & BP has been controlled at home. Today's  .  Patient has had no complaints of any cardiac type chest pain, palpitations, dyspnea / orthopnea / PND, dizziness, claudication, or dependent edema. Hyperlipidemia is controlled with diet & meds.  Also, the patient has history of T2_NIDDM and has had no symptoms of reactive hypoglycemia, diabetic polys, paresthesias or visual blurring.  Last A1c was 6.8% and not at goal. Further, the patient also has history of Vitamin D Deficiency and supplements vitamin D without any suspected side-effects. Last vitamin D was 53 and at goal. Patient also has hx/o Gout which is asymptomatic on his current meds.   Current Outpatient Medications on File Prior to Visit  Medication Sig  . allopurinol (ZYLOPRIM) 300 MG tablet TAKE 1/2 TO 1 TABLET DAILY TO PREVENT GOUT  . aspirin 81 MG tablet Take 81 mg by mouth daily. Reported on 09/23/2015  . bisoprolol-hydrochlorothiazide (ZIAC) 10-6.25 MG tablet TAKE 1 TABLET BY MOUTH IN THE MORNING FOR BLOOD PRESSURE  . Cholecalciferol (VITAMIN D PO) Take 10,000 Units by mouth daily.  . meloxicam (MOBIC) 7.5 MG tablet TAKE 1 TABLET (7.5 MG TOTAL) BY MOUTH DAILY.  . metFORMIN (GLUCOPHAGE-XR) 500 MG 24 hr tablet Take 1 to 2 tablets  TWICE /Daily as directed for Diabetes  . sildenafil (REVATIO) 20 MG tablet TAKE 1-5 TABS EVERY DAY AS NEEDED   No current facility-administered medications on file prior to visit.    Allergies  Allergen Reactions  . Codeine Nausea And Vomiting  . Nsaids Other (See Comments)    Bleeding ulcer 2013   PMHx:   Past Medical History:    Diagnosis Date  . Allergy    seasonal  . Blood transfusion 09/2011   2 units  . Hyperlipidemia   . Hypertension   . Renal disorder   . Stomach ulcer    Immunization History  Administered Date(s) Administered  . PPD Test 01/02/2014, 03/11/2015, 03/31/2016  . Pneumococcal Polysaccharide-23 10/04/2010  . Tdap 10/10/2011   Past Surgical History:  Procedure Laterality Date  . FRACTURE SURGERY  broken collar bone   FHx:    Reviewed / unchanged  SHx:    Reviewed / unchanged  Systems Review:  Constitutional: Denies fever, chills, wt changes, headaches, insomnia, fatigue, night sweats, change in appetite. Eyes: Denies redness, blurred vision, diplopia, discharge, itchy, watery eyes.  ENT: Denies discharge, congestion, post nasal drip, epistaxis, sore throat, earache, hearing loss, dental pain, tinnitus, vertigo, sinus pain, snoring.  CV: Denies chest pain, palpitations, irregular heartbeat, syncope, dyspnea, diaphoresis, orthopnea, PND, claudication or edema. Respiratory: denies cough, dyspnea, DOE, pleurisy, hoarseness, laryngitis, wheezing.  Gastrointestinal: Denies dysphagia, odynophagia, heartburn, reflux, water brash, abdominal pain or cramps, nausea, vomiting, bloating, diarrhea, constipation, hematemesis, melena, hematochezia  or hemorrhoids. Genitourinary: Denies dysuria, frequency, urgency, nocturia, hesitancy, discharge, hematuria or flank pain. Musculoskeletal: Denies arthralgias, myalgias, stiffness, jt. swelling, pain, limping or strain/sprain.  Skin: Denies pruritus, rash, hives, warts, acne, eczema or change in skin lesion(s). Neuro: No weakness, tremor, incoordination, spasms, paresthesia or pain. Psychiatric: Denies confusion, memory loss or  sensory loss. Endo: Denies change in weight, skin or hair change.  Heme/Lymph: No excessive bleeding, bruising or enlarged lymph nodes.  Physical Exam  There were no vitals taken for this visit.  Appears Overnourished, well  groomed  and in no distress.  Eyes: PERRLA, EOMs, conjunctiva no swelling or erythema. Sinuses: No frontal/maxillary tenderness ENT/Mouth: EAC's clear, TM's nl w/o erythema, bulging. Nares clear w/o erythema, swelling, exudates. Oropharynx clear without erythema or exudates. Oral hygiene is good. Tongue normal, non obstructing. Hearing intact.  Neck: Supple. Thyroid nl. Car 2+/2+ without bruits, nodes or JVD. Chest: Respirations nl with BS clear & equal w/o rales, rhonchi, wheezing or stridor.  Cor: Heart sounds normal w/ regular rate and rhythm without sig. murmurs, gallops, clicks or rubs. Peripheral pulses normal and equal  without edema.  Musculoskeletal: Full ROM all peripheral extremities, joint stability, 5/5 strength and normal gait.  Skin: Warm, dry without exposed rashes, lesions or ecchymosis apparent.  Neuro: Cranial nerves intact, reflexes equal bilaterally. Sensory-motor testing grossly intact. Tendon reflexes grossly intact.  Pysch: Alert & oriented x 3.  Insight and judgement nl & appropriate. No ideations.  Assessment and Plan:  1. Restless legs syndrome (RLS)  - rOPINIRole (REQUIP) 1 MG tablet; Take 1/2 to 1 tablet 2 to 3 x / day for Restless Legs  Dispense: 90 tablet; Refill: 0 - Discussed sedative precautions   2. Iron deficiency anemia  - CBC with Differential/Platelet - Iron,Total/Total Iron Binding Cap - Ferritin      Discussed  regular exercise, BP monitoring, weight control to achieve/maintain BMI less than 25 and discussed med and SE's. Recommended labs to assess and monitor clinical status with further disposition pending results of labs. Over 30 minutes of exam, counseling, chart review was performed.

## 2017-04-11 NOTE — Patient Instructions (Addendum)
Restless Legs Syndrome Restless legs syndrome is a condition that causes uncomfortable feelings or sensations in the legs, especially while sitting or lying down. The sensations usually cause an overwhelming urge to move the legs. The arms can also sometimes be affected. The condition can range from mild to severe. The symptoms often interfere with a person's ability to sleep. What are the causes? The cause of this condition is not known. What increases the risk? This condition is more likely to develop in:  People who are older than age 56.  Pregnant women. In general, restless legs syndrome is more common in women than in men.  People who have a family history of the condition.  People who have certain medical conditions, such as iron deficiency, kidney disease, Parkinson disease, or nerve damage.  People who take certain medicines, such as medicines for high blood pressure, nausea, colds, allergies, depression, and some heart conditions.  What are the signs or symptoms? The main symptom of this condition is uncomfortable sensations in the legs. These sensations may be:  Described as pulling, tingling, prickling, throbbing, crawling, or burning.  Worse while you are sitting or lying down.  Worse during periods of rest or inactivity.  Worse at night, often interfering with your sleep.  Accompanied by a very strong urge to move your legs.  Temporarily relieved by movement of your legs.  The sensations usually affect both sides of the body. The arms can also be affected, but this is rare. People who have this condition often have tiredness during the day because of their lack of sleep at night. How is this diagnosed? This condition may be diagnosed based on your description of the symptoms. You may also have tests, including blood tests, to check for other conditions that may lead to your symptoms. In some cases, you may be asked to spend some time in a sleep lab so your sleeping  can be monitored. How is this treated? Treatment for this condition is focused on managing the symptoms. Treatment may include:  Self-help and lifestyle changes.  Medicines.  Follow these instructions at home:  Take medicines only as directed by your health care provider.  Try these methods to get temporary relief from the uncomfortable sensations: ? Massage your legs. ? Walk or stretch. ? Take a cold or hot bath.  Practice good sleep habits. For example, go to bed and get up at the same time every day.  Exercise regularly.  Practice ways of relaxing, such as yoga or meditation.  Avoid caffeine and alcohol.  Do not use any tobacco products, including cigarettes, chewing tobacco, or electronic cigarettes. If you need help quitting, ask your health care provider.  Keep all follow-up visits as directed by your health care provider. This is important.

## 2017-04-12 LAB — CBC WITH DIFFERENTIAL/PLATELET
Basophils Absolute: 51 cells/uL (ref 0–200)
Basophils Relative: 0.8 %
Eosinophils Absolute: 211 cells/uL (ref 15–500)
Eosinophils Relative: 3.3 %
HCT: 45.6 % (ref 38.5–50.0)
Hemoglobin: 15.9 g/dL (ref 13.2–17.1)
Lymphs Abs: 2170 cells/uL (ref 850–3900)
MCH: 31.9 pg (ref 27.0–33.0)
MCHC: 34.9 g/dL (ref 32.0–36.0)
MCV: 91.4 fL (ref 80.0–100.0)
MPV: 9.9 fL (ref 7.5–12.5)
Monocytes Relative: 7.3 %
Neutro Abs: 3501 cells/uL (ref 1500–7800)
Neutrophils Relative %: 54.7 %
Platelets: 220 10*3/uL (ref 140–400)
RBC: 4.99 10*6/uL (ref 4.20–5.80)
RDW: 11.8 % (ref 11.0–15.0)
Total Lymphocyte: 33.9 %
WBC mixed population: 467 cells/uL (ref 200–950)
WBC: 6.4 10*3/uL (ref 3.8–10.8)

## 2017-04-12 LAB — FERRITIN: Ferritin: 526 ng/mL — ABNORMAL HIGH (ref 20–380)

## 2017-04-12 LAB — IRON, TOTAL/TOTAL IRON BINDING CAP
%SAT: 46 % (calc) (ref 15–60)
Iron: 152 ug/dL (ref 50–180)
TIBC: 330 mcg/dL (calc) (ref 250–425)

## 2017-05-02 NOTE — Patient Instructions (Signed)

## 2017-05-02 NOTE — Progress Notes (Signed)
[facility]   [name], M.D.     Uvaldo Bristle. Silverio Lay, P.A.-C [name], [place]                [address] [place], [place] [address] Telephone [phone] Telefax [phone] Annual  Screening/Preventative Visit  & Comprehensive Evaluation & Examination     This very nice 56 y.o. MWM  presents for a Screening / Preventative Visit & comprehensive evaluation and management of multiple medical co-morbidities.  Patient has been followed for HTN, T2_NIDDM, Hyperlipidemia and Vitamin D Deficiency. Patient  OSA but alleges intolerance to the CPAP mask. He also has hx/o Gout and has self d/c'd his Allopurinol.     Patient had (+) adenomatous polyp in 2013 & was recc f/u in 3 years (never consummated)     HTN predates since 2001.  Patient is poorly compliant with his BP meds. Has been off of his BPmeds.  Today's BP is not at goal - 154/88. Patient denies any cardiac symptoms as chest pain, palpitations, shortness of breath, dizziness or ankle swelling.     Patient's hyperlipidemia is controlled with diet and medications. Patient denies myalgias or other medication SE's. Last lipids were at goal and elevated Trig's.  Lab Results  Component Value Date   CHOL 200 (H) 01/31/2017   HDL 44 01/31/2017   LDLCALC 94 07/13/2016   TRIG 192 (H) 01/31/2017   CHOLHDL 4.5 01/31/2017      Patient has Morbid Obesity (BMI 38+) and consequent  T2_NIDDM (A1c 7.1% / July 2017) and is off of his Metformin for unknown reasons. He denies reactive hypoglycemic symptoms, visual blurring, diabetic polys or paresthesias. Last A1c was not at goal: Lab Results  Component Value Date   HGBA1C 6.8 (H) 01/31/2017       Patient has Testosterone Deficiency (Level 208 in 2011) and has sporadically been on parenteral therapy. Finally, patient has history of Vitamin D Deficiency ("20" / 2008)  and last vitamin D was  improved: Lab Results  Component Value Date   VD25OH 44 10/14/2016   Current Outpatient Medications on File Prior to Visit  Medication Sig  . allopurinol (ZYLOPRIM) 300 MG tablet TAKE 1/2 TO 1 TABLET DAILY TO PREVENT GOUT  . aspirin 81 MG tablet Take 81 mg by mouth daily. Reported on 09/23/2015  . bisoprolol-hydrochlorothiazide (ZIAC) 10-6.25 MG tablet TAKE 1 TABLET BY MOUTH IN THE MORNING FOR BLOOD PRESSURE  . meloxicam (MOBIC) 7.5 MG tablet TAKE 1 TABLET (7.5 MG TOTAL) BY MOUTH DAILY.  Marland Kitchen OVER THE COUNTER MEDICATION Takes a MVI.  Marland Kitchen sildenafil (REVATIO) 20 MG tablet TAKE 1-5 TABS EVERY DAY AS NEEDED  . Cholecalciferol (VITAMIN D PO) Take 10,000 Units by mouth daily.  . metFORMIN (GLUCOPHAGE-XR) 500 MG 24 hr tablet Take 1 to 2 tablets  TWICE /Daily as directed for Diabetes (Patient not taking: Reported on 05/03/2017)   No current facility-administered medications on file prior to visit.    Allergies  Allergen Reactions  . Codeine Nausea And Vomiting  . Nsaids Other (See Comments)    Bleeding ulcer 2013   Past Medical History:  Diagnosis Date  . Allergy    seasonal  . Blood transfusion 09/2011   2 units  . Hyperlipidemia   . Hypertension   . Renal disorder   . Stomach ulcer    Health Maintenance  Topic Date Due  . OPHTHALMOLOGY EXAM  03/17/1971  . COLONOSCOPY  12/06/2014  . PNEUMOCOCCAL POLYSACCHARIDE VACCINE (2) 10/04/2015  . INFLUENZA VACCINE  12/28/2016  . URINE MICROALBUMIN  03/31/2017  . HEMOGLOBIN A1C  07/31/2017  . FOOT EXAM  05/02/2018  . TETANUS/TDAP  10/09/2021  . Hepatitis C Screening  Completed  . HIV Screening  Completed   Immunization History  Administered Date(s) Administered  . PPD Test 01/02/2014, 03/11/2015, 03/31/2016  . Pneumococcal Polysaccharide-23 10/04/2010  . Tdap 10/10/2011   Past Surgical History:  Procedure Laterality Date  . ESOPHAGOGASTRODUODENOSCOPY  10/26/2011   Procedure: ESOPHAGOGASTRODUODENOSCOPY (EGD);  Surgeon: Irene Shipper, MD;   Location[facility]COPY;  Service: Endoscopy;  Laterality: N/A;  . FRACTURE SURGERY  broken collar bone   Family History  Adopted: Yes  Problem Relation Age of Onset  . Breast cancer Mother    Social History   Socioeconomic History  . Marital status: Married  Social Needs  Occupational History  . Not on file  Tobacco Use  . Smoking status: Never Smoker  . Smokeless tobacco: Never Used  Substance and Sexual Activity  . Alcohol use: Yes    Alcohol/week: 0.6 oz    Types: 1 Cans of beer per week    Comment: occasionally   . Drug use: No  . Sexual activity: Not on file    ROS Constitutional: Denies fever, chills, weight loss/gain, headaches, insomnia,  night sweats or change in appetite. Does c/o fatigue. Eyes: Denies redness, blurred vision, diplopia, discharge, itchy or watery eyes.  ENT: Denies discharge, congestion, post nasal drip, epistaxis, sore throat, earache, hearing loss, dental pain, Tinnitus, Vertigo, Sinus pain or snoring.  Cardio: Denies chest pain, palpitations, irregular heartbeat, syncope, dyspnea, diaphoresis, orthopnea, PND, claudication or edema Respiratory: denies cough, dyspnea, DOE, pleurisy, hoarseness, laryngitis or wheezing.  Gastrointestinal: Denies dysphagia, heartburn, reflux, water brash, pain, cramps, nausea, vomiting, bloating, diarrhea, constipation, hematemesis, melena, hematochezia, jaundice or hemorrhoids Genitourinary: Denies dysuria, frequency, urgency, nocturia, hesitancy, discharge, hematuria or flank pain Musculoskeletal: Denies arthralgia, myalgia, stiffness, Jt. Swelling, pain, limp or strain/sprain. Denies Falls. Skin: Denies puritis, rash, hives, warts, acne, eczema or change in skin lesion Neuro: No weakness, tremor, incoordination, spasms, paresthesia or pain Psychiatric: Denies confusion, memory loss or sensory loss. Denies Depression. Endocrine: Denies change in weight, skin, hair change, nocturia, and paresthesia, diabetic polys,  visual blurring or hyper / hypo glycemic episodes.  Heme/Lymph: No excessive bleeding, bruising or enlarged lymph nodes.  Physical Exam  BP (!) 154/88   Pulse 68   Temp 98.1 F (36.7 C)   Resp 18   Ht 5' 11.75" (1.822 m)   Wt 279 lb (126.6 kg)   BMI 38.10 kg/m   General Appearance: Well nourished and well groomed and in no apparent distress.  Eyes: PERRLA, EOMs, conjunctiva no swelling or erythema, normal fundi and vessels. Sinuses: No frontal/maxillary tenderness ENT/Mouth: EACs patent / TMs  nl. Nares clear without erythema, swelling, mucoid exudates. Oral hygiene is good. No erythema, swelling, or exudate. Tongue normal, non-obstructing. Tonsils not swollen or erythematous. Hearing normal.  Neck: Supple, thyroid normal. No bruits, nodes or JVD. Respiratory: Respiratory effort normal.  BS equal and clear bilateral without rales, rhonci, wheezing or stridor. Cardio: Heart sounds are normal with regular rate and rhythm and no murmurs, rubs or gallops. Peripheral pulses are normal and equal bilaterally without edema. No aortic or femoral bruits. Chest: symmetric with normal excursions and percussion.  Abdomen: Soft, with Nl bowel sounds. Nontender,  no guarding, rebound, hernias, masses, or organomegaly.  Lymphatics: Non tender without lymphadenopathy.  Genitourinary: No hernias.Testes nl. DRE - prostate nl for age - smooth & firm w/o nodules. Musculoskeletal: Full ROM all peripheral extremities, joint stability, 5/5 strength, and normal gait. Skin: Warm and dry without rashes, lesions, cyanosis, clubbing or  ecchymosis.  Neuro: Cranial nerves intact, reflexes equal bilaterally. Normal muscle tone, no cerebellar symptoms. Sensation intact to touch, vibratory and Monofilament to the toes bilaterally. Pysch: Alert and oriented X 3 with normal affect, insight and judgment appropriate.   Assessment and Plan  1. Annual Preventative/Screening Exam   2. Essential hypertension  - EKG  12-Lead - [place], RETROPERITNL ABD,  LTD - Urinalysis, Routine w reflex microscopic - Microalbumin / creatinine urine ratio - CBC with Differential/Platelet - BASIC METABOLIC PANEL WITH GFR - Magnesium - TSH  3. Hyperlipidemia, mixed  - EKG 12-Lead - [place], RETROPERITNL ABD,  LTD - Hepatic function panel - Lipid panel - TSH  4. T2_NIDDM (HCC)  - EKG 12-Lead - [place], RETROPERITNL ABD,  LTD - Urinalysis, Routine w reflex microscopic - Microalbumin / creatinine urine ratio - HM DIABETES FOOT EXAM - LOW EXTREMITY NEUR EXAM DOCUM - Hemoglobin A1c - Insulin, random  5. Vitamin D deficiency  - VITAMIN D 25 Hydroxy (Vit-D Deficiency, Fractures)  6. Testosterone deficiency  - Testosterone  7. Class 2 severe obesity due to excess calories with serious comorbidity and body mass index (BMI) of 38.0 to 38.9 in adult ([place])   8. Idiopathic gout, unspecified chronicity, unspecified site  - Uric acid  9. OSA on CPAP   10. Restless legs syndrome (RLS)  - Iron,Total/Total Iron Binding Cap  11. Gastroesophageal reflux disease, esophagitis presence not specified  - CBC with Differential/Platelet  12. Screening for colorectal cancer  - POC Hemoccult Bld/Stl  13. Prostate cancer screening   14. Screening for ischemic heart disease  - EKG 12-Lead  15. Screening for AAA (aortic abdominal aneurysm)  - [place], RETROPERITNL ABD,  LTD  16. Screening examination for pulmonary tuberculosis  - PPD  17. Fatigue, unspecified type  - Iron,Total/Total Iron Binding Cap - Vitamin B12 - TSH  18. Medication management  - Urinalysis, Routine w reflex microscopic - Microalbumin / creatinine urine ratio - Testosterone - CBC with Differential/Platelet - BASIC METABOLIC PANEL WITH GFR - Hepatic function panel - Magnesium - Lipid panel - TSH - Hemoglobin A1c - Insulin, random - VITAMIN D 25 Hydroxy  - Uric acid  19. Adenomatous polyp of colon, hx (2013)   - Ambulatory referral  to Gastroenterology  20. Need for prophylactic vaccination against Streptococcus pneumoniae (pneumococcus)  - Pneumococcal polysaccharide vaccine 23-valent greater than or equal to 2yo subcutaneous/IM        Patient was extensively counseled in prudent diet, weight control to achieve/maintain BMI less than 25, BP  And Diabetic monitoring, regular exercise and medications as discussed.  Patient was advised to call ins co to obtain Glucometer & strips. Discussed med effects and SE's. Routine screening labs and tests as requested with regular follow-up as recommended. Over 45 minutes of exam, counseling, chart review and high complex critical decision making was performed  - Recc schedule Diabetic Teaching in addition to quarterly f/u OV's.

## 2017-05-03 ENCOUNTER — Encounter: Payer: Self-pay | Admitting: Internal Medicine

## 2017-05-03 ENCOUNTER — Ambulatory Visit: Payer: BLUE CROSS/BLUE SHIELD | Admitting: Internal Medicine

## 2017-05-03 VITALS — BP 154/88 | HR 68 | Temp 98.1°F | Resp 18 | Ht 71.75 in | Wt 279.0 lb

## 2017-05-03 DIAGNOSIS — Z0000 Encounter for general adult medical examination without abnormal findings: Secondary | ICD-10-CM | POA: Diagnosis not present

## 2017-05-03 DIAGNOSIS — I10 Essential (primary) hypertension: Secondary | ICD-10-CM

## 2017-05-03 DIAGNOSIS — E291 Testicular hypofunction: Secondary | ICD-10-CM | POA: Diagnosis not present

## 2017-05-03 DIAGNOSIS — E559 Vitamin D deficiency, unspecified: Secondary | ICD-10-CM | POA: Diagnosis not present

## 2017-05-03 DIAGNOSIS — E119 Type 2 diabetes mellitus without complications: Secondary | ICD-10-CM

## 2017-05-03 DIAGNOSIS — Z125 Encounter for screening for malignant neoplasm of prostate: Secondary | ICD-10-CM

## 2017-05-03 DIAGNOSIS — M1000 Idiopathic gout, unspecified site: Secondary | ICD-10-CM

## 2017-05-03 DIAGNOSIS — Z79899 Other long term (current) drug therapy: Secondary | ICD-10-CM | POA: Diagnosis not present

## 2017-05-03 DIAGNOSIS — M109 Gout, unspecified: Secondary | ICD-10-CM | POA: Diagnosis not present

## 2017-05-03 DIAGNOSIS — Z1212 Encounter for screening for malignant neoplasm of rectum: Secondary | ICD-10-CM

## 2017-05-03 DIAGNOSIS — G4733 Obstructive sleep apnea (adult) (pediatric): Secondary | ICD-10-CM

## 2017-05-03 DIAGNOSIS — R5383 Other fatigue: Secondary | ICD-10-CM

## 2017-05-03 DIAGNOSIS — Z0001 Encounter for general adult medical examination with abnormal findings: Secondary | ICD-10-CM

## 2017-05-03 DIAGNOSIS — Z136 Encounter for screening for cardiovascular disorders: Secondary | ICD-10-CM

## 2017-05-03 DIAGNOSIS — Z111 Encounter for screening for respiratory tuberculosis: Secondary | ICD-10-CM | POA: Diagnosis not present

## 2017-05-03 DIAGNOSIS — E349 Endocrine disorder, unspecified: Secondary | ICD-10-CM

## 2017-05-03 DIAGNOSIS — D126 Benign neoplasm of colon, unspecified: Secondary | ICD-10-CM

## 2017-05-03 DIAGNOSIS — Z6838 Body mass index (BMI) 38.0-38.9, adult: Secondary | ICD-10-CM

## 2017-05-03 DIAGNOSIS — E782 Mixed hyperlipidemia: Secondary | ICD-10-CM

## 2017-05-03 DIAGNOSIS — Z1211 Encounter for screening for malignant neoplasm of colon: Secondary | ICD-10-CM

## 2017-05-03 DIAGNOSIS — G2581 Restless legs syndrome: Secondary | ICD-10-CM

## 2017-05-03 DIAGNOSIS — Z9989 Dependence on other enabling machines and devices: Secondary | ICD-10-CM

## 2017-05-03 DIAGNOSIS — K219 Gastro-esophageal reflux disease without esophagitis: Secondary | ICD-10-CM

## 2017-05-03 DIAGNOSIS — Z23 Encounter for immunization: Secondary | ICD-10-CM | POA: Diagnosis not present

## 2017-05-04 ENCOUNTER — Other Ambulatory Visit: Payer: Self-pay | Admitting: Internal Medicine

## 2017-05-04 LAB — CBC WITH DIFFERENTIAL/PLATELET
Basophils Absolute: 43 cells/uL (ref 0–200)
Basophils Relative: 0.7 %
Eosinophils Absolute: 183 cells/uL (ref 15–500)
Eosinophils Relative: 3 %
HCT: 47.8 % (ref 38.5–50.0)
Hemoglobin: 16.4 g/dL (ref 13.2–17.1)
Lymphs Abs: 2074 cells/uL (ref 850–3900)
MCH: 31.5 pg (ref 27.0–33.0)
MCHC: 34.3 g/dL (ref 32.0–36.0)
MCV: 91.9 fL (ref 80.0–100.0)
MPV: 10.1 fL (ref 7.5–12.5)
Monocytes Relative: 7.9 %
Neutro Abs: 3318 cells/uL (ref 1500–7800)
Neutrophils Relative %: 54.4 %
Platelets: 232 10*3/uL (ref 140–400)
RBC: 5.2 10*6/uL (ref 4.20–5.80)
RDW: 11.8 % (ref 11.0–15.0)
Total Lymphocyte: 34 %
WBC mixed population: 482 cells/uL (ref 200–950)
WBC: 6.1 10*3/uL (ref 3.8–10.8)

## 2017-05-04 LAB — IRON, TOTAL/TOTAL IRON BINDING CAP
%SAT: 42 % (calc) (ref 15–60)
Iron: 133 ug/dL (ref 50–180)
TIBC: 314 mcg/dL (calc) (ref 250–425)

## 2017-05-04 LAB — BASIC METABOLIC PANEL WITH GFR
BUN: 17 mg/dL (ref 7–25)
CO2: 29 mmol/L (ref 20–32)
Calcium: 9.9 mg/dL (ref 8.6–10.3)
Chloride: 103 mmol/L (ref 98–110)
Creat: 0.95 mg/dL (ref 0.70–1.33)
GFR, Est African American: 103 mL/min/{1.73_m2} (ref >=60)
GFR, Est Non African American: 89 mL/min/{1.73_m2} (ref >=60)
Glucose, Bld: 178 mg/dL — ABNORMAL HIGH (ref 65–99)
Potassium: 4.7 mmol/L (ref 3.5–5.3)
Sodium: 140 mmol/L (ref 135–146)

## 2017-05-04 LAB — MICROALBUMIN / CREATININE URINE RATIO
Creatinine, Urine: 129 mg/dL (ref 20–320)
Microalb Creat Ratio: 15 mcg/mg creat (ref <30)
Microalb, Ur: 1.9 mg/dL

## 2017-05-04 LAB — HEMOGLOBIN A1C
Hgb A1c MFr Bld: 6.8 % of total Hgb — ABNORMAL HIGH (ref <5.7)
Mean Plasma Glucose: 148 (calc)
eAG (mmol/L): 8.2 (calc)

## 2017-05-04 LAB — LIPID PANEL
Cholesterol: 187 mg/dL (ref <200)
HDL: 39 mg/dL — ABNORMAL LOW (ref >40)
LDL Cholesterol (Calc): 124 mg/dL (calc) — ABNORMAL HIGH
Non-HDL Cholesterol (Calc): 148 mg/dL (calc) — ABNORMAL HIGH (ref <130)
Total CHOL/HDL Ratio: 4.8 (calc) (ref <5.0)
Triglycerides: 126 mg/dL (ref <150)

## 2017-05-04 LAB — HEPATIC FUNCTION PANEL
AG Ratio: 1.5 (calc) (ref 1.0–2.5)
ALT: 51 U/L — ABNORMAL HIGH (ref 9–46)
AST: 34 U/L (ref 10–35)
Albumin: 4.3 g/dL (ref 3.6–5.1)
Alkaline phosphatase (APISO): 53 U/L (ref 40–115)
Bilirubin, Direct: 0.3 mg/dL — ABNORMAL HIGH (ref 0.0–0.2)
Globulin: 2.9 g/dL (calc) (ref 1.9–3.7)
Indirect Bilirubin: 0.9 mg/dL (calc) (ref 0.2–1.2)
Total Bilirubin: 1.2 mg/dL (ref 0.2–1.2)
Total Protein: 7.2 g/dL (ref 6.1–8.1)

## 2017-05-04 LAB — URINALYSIS, ROUTINE W REFLEX MICROSCOPIC
Bilirubin Urine: NEGATIVE
Glucose, UA: NEGATIVE
Hgb urine dipstick: NEGATIVE
Ketones, ur: NEGATIVE
Leukocytes, UA: NEGATIVE
Nitrite: NEGATIVE
Protein, ur: NEGATIVE
Specific Gravity, Urine: 1.021 (ref 1.001–1.03)
pH: 5.5 (ref 5.0–8.0)

## 2017-05-04 LAB — URIC ACID: Uric Acid, Serum: 9.1 mg/dL — ABNORMAL HIGH (ref 4.0–8.0)

## 2017-05-04 LAB — VITAMIN B12: Vitamin B-12: 567 pg/mL (ref 200–1100)

## 2017-05-04 LAB — TSH: TSH: 1.74 mIU/L (ref 0.40–4.50)

## 2017-05-04 LAB — TESTOSTERONE: Testosterone: 276 ng/dL (ref 250–827)

## 2017-05-04 LAB — INSULIN, RANDOM: Insulin: 32.7 u[IU]/mL — ABNORMAL HIGH (ref 2.0–19.6)

## 2017-05-04 LAB — VITAMIN D 25 HYDROXY (VIT D DEFICIENCY, FRACTURES): Vit D, 25-Hydroxy: 43 ng/mL (ref 30–100)

## 2017-05-04 LAB — MAGNESIUM: Magnesium: 1.9 mg/dL (ref 1.5–2.5)

## 2017-05-04 MED ORDER — ROSUVASTATIN CALCIUM 40 MG PO TABS: ORAL_TABLET | ORAL | 5 refills | Status: DC

## 2017-05-08 ENCOUNTER — Other Ambulatory Visit: Payer: Self-pay | Admitting: Internal Medicine

## 2017-05-08 DIAGNOSIS — G2581 Restless legs syndrome: Secondary | ICD-10-CM

## 2017-05-24 ENCOUNTER — Other Ambulatory Visit: Payer: Self-pay | Admitting: Internal Medicine

## 2017-05-24 DIAGNOSIS — R7303 Prediabetes: Secondary | ICD-10-CM

## 2017-06-21 ENCOUNTER — Ambulatory Visit: Payer: Self-pay | Admitting: Adult Health

## 2017-07-06 ENCOUNTER — Encounter: Payer: Self-pay | Admitting: Internal Medicine

## 2017-07-10 ENCOUNTER — Telehealth: Payer: Self-pay | Admitting: Internal Medicine

## 2017-07-10 NOTE — Telephone Encounter (Signed)
Patient called for advise. Is there something going around? Coughing w/ no production, on Theraflux for 1 week. Should he take mucinex? Is there anything else he should be taking?

## 2017-08-16 DIAGNOSIS — E1122 Type 2 diabetes mellitus with diabetic chronic kidney disease: Secondary | ICD-10-CM | POA: Insufficient documentation

## 2017-08-16 DIAGNOSIS — N182 Chronic kidney disease, stage 2 (mild): Secondary | ICD-10-CM

## 2017-08-16 NOTE — Progress Notes (Signed)
FOLLOW UP  Assessment and Plan:   Hypertension Well controlled with current medications  Monitor blood pressure at home; patient to call if consistently greater than 130/80 Continue DASH diet.   Reminder to go to the ER if any CP, SOB, nausea, dizziness, severe HA, changes vision/speech, left arm numbness and tingling and jaw pain.  Cholesterol Currently above goal; had not started crestor - discussed and will initiate on atorvastatin 10 mg daily first Continue low cholesterol diet and exercise.  Check lipid panel.   Diabetes with diabetic chronic kidney disease Continue medication: metformin - increase to 4 tabs daily Continue diet and exercise.  Perform daily foot/skin check, notify office of any concerning changes.  Check A1C  Morbid Obesity with co morbidities Long discussion about weight loss, diet, and exercise Recommended diet heavy in fruits and veggies and low in animal meats, cheeses, and dairy products, appropriate calorie intake Discussed ideal weight for height  Patient will work on cutting out soda Will follow up in 3 months  Vitamin D Def Continue supplementation - has been not taking for past week Defer vitamin D level  Gout Patient preference is currently off allopurinol Diet discussed Check uric acid as needed  Abnormal LFTs LFT, GGT, abdominal US as has never had -  Discussed with patient and will obtain today- suspect fatty liver disease  Continue diet and meds as discussed. Further disposition pending results of labs. Discussed med's effects and SE's.   Over 30 minutes of exam, counseling, chart review, and critical decision making was performed.   Future Appointments  Date Time Provider [place]  11/17/2017  9:30 AM [name], MD [facility] None  06/04/2018  9:00 AM [name], MD [facility] None     ----------------------------------------------------------------------------------------------------------------------  HPI 57 y.o. male  presents for 3 month follow up on hypertension, cholesterol, diabetes, morbid obesity, gout and vitamin D deficiency.   He also has mildly elevated LFTs ongoing intermittently for several years, but consistently elevated since 2017; on review of chart, it does not appear GGT or [place] has ever been obtained. He has been screened by hepatitis panel which was negative in 2015.   BMI is Body mass index is 38.77 kg/m., he has been working on and exercise. Started walking last week - has not been working on diet.  Wt Readings from Last 3 Encounters:  08/17/17 278 lb (126.1 kg)  05/03/17 279 lb (126.6 kg)  04/11/17 279 lb 3.2 oz (126.6 kg)   His blood pressure has been controlled at home, today their BP is BP: 138/83  He does workout. He denies chest pain, shortness of breath, dizziness.   He is on cholesterol medication (was prescribed crestor- never picked up ) and denies myalgias. His cholesterol is not at goal. The cholesterol last visit was:   Lab Results  Component Value Date   CHOL 187 05/03/2017   HDL 39 (L) 05/03/2017   LDLCALC 124 (H) 05/03/2017   TRIG 126 05/03/2017   CHOLHDL 4.8 05/03/2017    He has not been working on diet and exercise for T2 diabetes on metformin, and denies foot ulcerations, hyperglycemia, increased appetite, nausea, paresthesia of the feet, polydipsia, polyuria, visual disturbances, vomiting and weight loss. Last A1C in the office was:  Lab Results  Component Value Date   HGBA1C 6.8 (H) 05/03/2017   Patient is on Vitamin D supplement but remained well below goal of 70 at last check:   Lab Results  Component Value Date   VD25OH 57  05/03/2017     Patient is on allopurinol for gout and does not report a recent flare.  Lab Results  Component Value Date   LABURIC 9.1 (H) 05/03/2017      Current Medications:   Current Outpatient Medications on File Prior to Visit  Medication Sig  . allopurinol (ZYLOPRIM) 300 MG tablet TAKE 1/2 TO 1 TABLET DAILY TO PREVENT GOUT  . aspirin 81 MG tablet Take 81 mg by mouth daily. Reported on 09/23/2015  . bisoprolol-hydrochlorothiazide (ZIAC) 10-6.25 MG tablet TAKE 1 TABLET BY MOUTH IN THE MORNING FOR BLOOD PRESSURE  . Cholecalciferol (VITAMIN D PO) Take 10,000 Units by mouth daily.  . meloxicam (MOBIC) 7.5 MG tablet TAKE 1 TABLET (7.5 MG TOTAL) BY MOUTH DAILY.  . metFORMIN (GLUCOPHAGE-XR) 500 MG 24 hr tablet TAKE 1 TO 2 TABLETS TWICE /DAILY AS DIRECTED FOR DIABETES  . OVER THE COUNTER MEDICATION Takes a MVI.  Marland Kitchen sildenafil (REVATIO) 20 MG tablet TAKE 1-5 TABS EVERY DAY AS NEEDED   No current facility-administered medications on file prior to visit.      Allergies:  Allergies  Allergen Reactions  . Codeine Nausea And Vomiting  . Nsaids Other (See Comments)    Bleeding ulcer 2013     Medical History:  Past Medical History:  Diagnosis Date  . Allergy    seasonal  . Blood transfusion 09/2011   2 units  . Hyperlipidemia   . Hypertension   . Renal disorder   . Stomach ulcer    Family history- Reviewed and unchanged Social history- Reviewed and unchanged   Review of Systems:  Review of Systems  Constitutional: Negative for malaise/fatigue and weight loss.  HENT: Negative for hearing loss and tinnitus.   Eyes: Negative for blurred vision and double vision.  Respiratory: Negative for cough, shortness of breath and wheezing.   Cardiovascular: Negative for chest pain, palpitations, orthopnea, claudication and leg swelling.  Gastrointestinal: Negative for abdominal pain, blood in stool, constipation, diarrhea, heartburn, melena, nausea and vomiting.  Genitourinary: Negative.   Musculoskeletal: Negative for joint pain and myalgias.  Skin: Negative for rash.  Neurological: Negative for dizziness, tingling, sensory change, weakness and headaches.   Endo/Heme/Allergies: Negative for polydipsia.  Psychiatric/Behavioral: Negative.   All other systems reviewed and are negative.     Physical Exam: BP 138/83   Pulse 68   Temp 97.7 F (36.5 C)   Resp 18   Ht 5\' 11"  (1.803 m)   Wt 278 lb (126.1 kg)   BMI 38.77 kg/m  Wt Readings from Last 3 Encounters:  08/17/17 278 lb (126.1 kg)  05/03/17 279 lb (126.6 kg)  04/11/17 279 lb 3.2 oz (126.6 kg)   General Appearance: Well nourished, in no apparent distress. Eyes: PERRLA, EOMs, conjunctiva no swelling or erythema Sinuses: No Frontal/maxillary tenderness ENT/Mouth: Ext aud canals clear, TMs without erythema, bulging. No erythema, swelling, or exudate on post pharynx.  Tonsils not swollen or erythematous. Hearing normal.  Neck: Supple, thyroid normal.  Respiratory: Respiratory effort normal, BS equal bilaterally without rales, rhonchi, wheezing or stridor.  Cardio: RRR with no MRGs. Brisk peripheral pulses without edema.  Abdomen: Soft, + BS.  Non tender, no guarding, rebound, hernias, masses. Lymphatics: Non tender without lymphadenopathy.  Musculoskeletal: Full ROM, 5/5 strength, Normal gait Skin: Warm, dry without rashes, lesions, ecchymosis.  Neuro: Cranial nerves intact. No cerebellar symptoms.  Psych: Awake and oriented X 3, normal affect, Insight and Judgment appropriate.    Izora Ribas, NP 9:07 AM [facility]  

## 2017-08-17 ENCOUNTER — Ambulatory Visit: Payer: BLUE CROSS/BLUE SHIELD | Admitting: Adult Health

## 2017-08-17 ENCOUNTER — Encounter: Payer: Self-pay | Admitting: Adult Health

## 2017-08-17 VITALS — BP 138/83 | HR 68 | Temp 97.7°F | Resp 18 | Ht 71.0 in | Wt 278.0 lb

## 2017-08-17 DIAGNOSIS — E559 Vitamin D deficiency, unspecified: Secondary | ICD-10-CM

## 2017-08-17 DIAGNOSIS — E1122 Type 2 diabetes mellitus with diabetic chronic kidney disease: Secondary | ICD-10-CM | POA: Diagnosis not present

## 2017-08-17 DIAGNOSIS — M1010 Lead-induced gout, unspecified site: Secondary | ICD-10-CM | POA: Diagnosis not present

## 2017-08-17 DIAGNOSIS — E119 Type 2 diabetes mellitus without complications: Secondary | ICD-10-CM

## 2017-08-17 DIAGNOSIS — M1000 Idiopathic gout, unspecified site: Secondary | ICD-10-CM

## 2017-08-17 DIAGNOSIS — I10 Essential (primary) hypertension: Secondary | ICD-10-CM | POA: Diagnosis not present

## 2017-08-17 DIAGNOSIS — E782 Mixed hyperlipidemia: Secondary | ICD-10-CM | POA: Diagnosis not present

## 2017-08-17 DIAGNOSIS — R7989 Other specified abnormal findings of blood chemistry: Secondary | ICD-10-CM

## 2017-08-17 DIAGNOSIS — N182 Chronic kidney disease, stage 2 (mild): Secondary | ICD-10-CM

## 2017-08-17 DIAGNOSIS — R945 Abnormal results of liver function studies: Secondary | ICD-10-CM | POA: Diagnosis not present

## 2017-08-17 DIAGNOSIS — Z79899 Other long term (current) drug therapy: Secondary | ICD-10-CM | POA: Diagnosis not present

## 2017-08-17 LAB — GAMMA GT: GGT: 28 U/L (ref 3–85)

## 2017-08-17 MED ORDER — ATORVASTATIN CALCIUM 10 MG PO TABS: 10.0000 mg | ORAL_TABLET | Freq: Every day | ORAL | 1 refills | Status: DC

## 2017-08-17 NOTE — Patient Instructions (Addendum)
Start atorvastatin 10 mg daily for cholesterol - take at night Gradually increase metformin up to taking 4 tabs a day - spread out over meals Consider adding zinc 50 mg daily for natural testosterone boost/energy  Strongly recommend cutting out sodas from daily consumption - most people lose 15-20 lb just doing this. Try to replace with water (can add lemon or mint, etc for flavor if needed) or can alternate with unsweet tea with stevia      When it comes to diets, agreement about the perfect plan isn't easy to find, even among the experts. Experts at the [employer] developed an idea known as the Healthy Eating Plate. Just imagine a plate divided into logical, healthy portions.  The emphasis is on diet quality:  Load up on vegetables and fruits - one-half of your plate: Aim for color and variety, and remember that potatoes don't count.  Go for whole grains - one-quarter of your plate: Whole wheat, barley, wheat berries, quinoa, oats, brown rice, and foods made with them. If you want pasta, go with whole wheat pasta.  Protein power - one-quarter of your plate: Fish, chicken, beans, and nuts are all healthy, versatile protein sources. Limit red meat.  The diet, however, does go beyond the plate, offering a few other suggestions.  Use healthy plant oils, such as olive, canola, soy, corn, sunflower and peanut. Check the labels, and avoid partially hydrogenated oil, which have unhealthy trans fats.  If you're thirsty, drink water. Coffee and tea are good in moderation, but skip sugary drinks and limit milk and dairy products to one or two daily servings.  The type of carbohydrate in the diet is more important than the amount. Some sources of carbohydrates, such as vegetables, fruits, whole grains, and beans-are healthier than others.  Finally, stay active.   Nonalcoholic Fatty Liver Disease Diet Nonalcoholic fatty liver disease is a condition that causes fat to  accumulate in and around the liver. The disease makes it harder for the liver to work the way that it should. Following a healthy diet can help to keep nonalcoholic fatty liver disease under control. It can also help to prevent or improve conditions that are associated with the disease, such as heart disease, diabetes, high blood pressure, and abnormal cholesterol levels. Along with regular exercise, this diet:  Promotes weight loss.  Helps to control blood sugar levels.  Helps to improve the way that the body uses insulin.  What do I need to know about this diet?  Use the glycemic index (GI) to plan your meals. The index tells you how quickly a food will raise your blood sugar. Choose low-GI foods. These foods take a longer time to raise blood sugar.  Keep track of how many calories you take in. Eating the right amount of calories will help you to achieve a healthy weight.  You may want to follow a Mediterranean diet. This diet includes a lot of vegetables, lean meats or fish, whole grains, fruits, and healthy oils and fats. What foods can I eat? Grains Whole grains, such as whole-wheat or whole-grain breads, crackers, tortillas, cereals, and pasta. Stone-ground whole wheat. Pumpernickel bread. Unsweetened oatmeal. Bulgur. Barley. Quinoa. Brown or wild rice. Corn or whole-wheat flour tortillas. Vegetables Lettuce. Spinach. Peas. Beets. Cauliflower. Cabbage. Broccoli. Carrots. Tomatoes. Squash. Eggplant. Herbs. Peppers. Onions. Cucumbers. Brussels sprouts. Yams and sweet potatoes. Beans. Lentils. Fruits Bananas. Apples. Oranges. Grapes. Papaya. Mango. Pomegranate. Kiwi. Grapefruit. Cherries. Meats and Other Protein Sources Seafood  and shellfish. Lean meats. Poultry. Tofu. Dairy Low-fat or fat-free dairy products, such as yogurt, cottage cheese, and cheese. Beverages Water. Sugar-free drinks. Tea. Coffee. Low-fat or skim milk. Milk alternatives, such as soy or almond milk. Real fruit  juice. Condiments Mustard. Relish. Low-fat, low-sugar ketchup and barbecue sauce. Low-fat or fat-free mayonnaise. Sweets and Desserts Sugar-free sweets. Fats and Oils Avocado. Canola or olive oil. Nuts and nut butters. Seeds. The items listed above may not be a complete list of recommended foods or beverages. Contact your dietitian for more options. What foods are not recommended? Palm oil and coconut oil. Processed foods. Fried foods. Sweetened drinks, such as sweet tea, milkshakes, snow cones, iced sweet drinks, and sodas. Alcohol. Sweets. Foods that contain a lot of salt or sodium. The items listed above may not be a complete list of foods and beverages to avoid. Contact your dietitian for more information. This information is not intended to replace advice given to you by your health care provider. Make sure you discuss any questions you have with your health care provider. Document Released: 09/30/2014 Document Revised: 10/22/2015 Document Reviewed: 06/10/2014 Elsevier Interactive Patient Education  [employer].

## 2017-08-18 LAB — BASIC METABOLIC PANEL WITH GFR
BUN: 13 mg/dL (ref 7–25)
CO2: 28 mmol/L (ref 20–32)
Calcium: 9.7 mg/dL (ref 8.6–10.3)
Chloride: 104 mmol/L (ref 98–110)
Creat: 1.06 mg/dL (ref 0.70–1.33)
GFR, Est African American: 90 mL/min/{1.73_m2} (ref >=60)
GFR, Est Non African American: 78 mL/min/{1.73_m2} (ref >=60)
Glucose, Bld: 210 mg/dL — ABNORMAL HIGH (ref 65–99)
Potassium: 4.9 mmol/L (ref 3.5–5.3)
Sodium: 141 mmol/L (ref 135–146)

## 2017-08-18 LAB — CBC WITH DIFFERENTIAL/PLATELET
Basophils Absolute: 62 cells/uL (ref 0–200)
Basophils Relative: 0.7 %
Eosinophils Absolute: 299 cells/uL (ref 15–500)
Eosinophils Relative: 3.4 %
HCT: 46.7 % (ref 38.5–50.0)
Hemoglobin: 16.5 g/dL (ref 13.2–17.1)
Lymphs Abs: 2235 cells/uL (ref 850–3900)
MCH: 31.8 pg (ref 27.0–33.0)
MCHC: 35.3 g/dL (ref 32.0–36.0)
MCV: 90 fL (ref 80.0–100.0)
MPV: 9.9 fL (ref 7.5–12.5)
Monocytes Relative: 7.5 %
Neutro Abs: 5544 cells/uL (ref 1500–7800)
Neutrophils Relative %: 63 %
Platelets: 245 10*3/uL (ref 140–400)
RBC: 5.19 10*6/uL (ref 4.20–5.80)
RDW: 11.8 % (ref 11.0–15.0)
Total Lymphocyte: 25.4 %
WBC mixed population: 660 cells/uL (ref 200–950)
WBC: 8.8 10*3/uL (ref 3.8–10.8)

## 2017-08-18 LAB — HEPATIC FUNCTION PANEL
AG Ratio: 1.4 (calc) (ref 1.0–2.5)
ALT: 31 U/L (ref 9–46)
AST: 24 U/L (ref 10–35)
Albumin: 4.3 g/dL (ref 3.6–5.1)
Alkaline phosphatase (APISO): 59 U/L (ref 40–115)
Bilirubin, Direct: 0.2 mg/dL (ref 0.0–0.2)
Globulin: 3.1 g/dL (calc) (ref 1.9–3.7)
Indirect Bilirubin: 0.7 mg/dL (calc) (ref 0.2–1.2)
Total Bilirubin: 0.9 mg/dL (ref 0.2–1.2)
Total Protein: 7.4 g/dL (ref 6.1–8.1)

## 2017-08-18 LAB — HEMOGLOBIN A1C
Hgb A1c MFr Bld: 6.8 % of total Hgb — ABNORMAL HIGH (ref <5.7)
Mean Plasma Glucose: 148 (calc)
eAG (mmol/L): 8.2 (calc)

## 2017-08-18 LAB — URIC ACID: Uric Acid, Serum: 9.4 mg/dL — ABNORMAL HIGH (ref 4.0–8.0)

## 2017-08-18 LAB — LIPID PANEL
Cholesterol: 184 mg/dL (ref <200)
HDL: 41 mg/dL (ref >40)
LDL Cholesterol (Calc): 116 mg/dL (calc) — ABNORMAL HIGH
Non-HDL Cholesterol (Calc): 143 mg/dL (calc) — ABNORMAL HIGH (ref <130)
Total CHOL/HDL Ratio: 4.5 (calc) (ref <5.0)
Triglycerides: 155 mg/dL — ABNORMAL HIGH (ref <150)

## 2017-08-18 LAB — TSH: TSH: 1.87 mIU/L (ref 0.40–4.50)

## 2017-08-24 ENCOUNTER — Telehealth: Payer: Self-pay | Admitting: Adult Health

## 2017-08-24 ENCOUNTER — Other Ambulatory Visit: Payer: Self-pay | Admitting: Adult Health

## 2017-08-24 MED ORDER — PREDNISONE 20 MG PO TABS: ORAL_TABLET | ORAL | 0 refills | Status: DC

## 2017-08-24 MED ORDER — PROMETHAZINE-DM 6.25-15 MG/5ML PO SYRP: 5.0000 mL | ORAL_SOLUTION | Freq: Four times a day (QID) | ORAL | 1 refills | Status: DC | PRN

## 2017-08-24 NOTE — Telephone Encounter (Signed)
Patient notified

## 2017-08-24 NOTE — Telephone Encounter (Signed)
Patient called, states has had  3 wk unproductive cough. requesting rx cough medicine to [facility] [place]. Using nasal spray, and mucinex, 12 hr and tylenol sinus. No fever, no chills, just persistent cough. Please advise

## 2017-10-09 ENCOUNTER — Encounter: Payer: Self-pay | Admitting: Physician Assistant

## 2017-10-09 ENCOUNTER — Ambulatory Visit: Payer: BLUE CROSS/BLUE SHIELD | Admitting: Physician Assistant

## 2017-10-09 VITALS — BP 132/68 | HR 92 | Temp 97.9°F | Resp 16 | Ht 71.0 in | Wt 277.6 lb

## 2017-10-09 DIAGNOSIS — L240 Irritant contact dermatitis due to detergents: Secondary | ICD-10-CM

## 2017-10-09 MED ORDER — TRIAMCINOLONE ACETONIDE 0.5 % EX CREA: 1.0000 "application " | TOPICAL_CREAM | Freq: Two times a day (BID) | CUTANEOUS | 2 refills | Status: DC

## 2017-10-09 NOTE — Progress Notes (Signed)
   Subjective:    Patient ID: Paul Warner, male    DOB: 28-Oct-1960, 57 y.o.   MRN: [id number]  HPI 57 y.o. WM presents with peeling of both his hands.  Cleaned an office with bleach and other chemicals without gloves on Saturday, started to have peeling of bilateral hands, no pain, itching. States it is improving, has put lotion on it but nothing else.   Denies any other specific medication, food, skin care product, detergent, soap.  She did not experience concomitant cardiopulmonary or GI symptoms.  She has no specific nasal symptom complaints today. Denies any tick exposure No joint pain.    Blood pressure 132/68, pulse 92, temperature 97.9 F (36.6 C), resp. rate 16, height 5\' 11"  (1.803 m), weight 277 lb 9.6 oz (125.9 kg), SpO2 97 %.  Review of Systems See HPI    Objective:   Physical Exam  Constitutional: He is oriented to person, place, and time. He appears well-developed and well-nourished.  HENT:  Head: Normocephalic and atraumatic.  Eyes: Pupils are equal, round, and reactive to light. Conjunctivae are normal.  Neck: Normal range of motion. Neck supple.  Cardiovascular: Normal rate and regular rhythm.  Pulmonary/Chest: Effort normal and breath sounds normal.  Abdominal: Soft. Bowel sounds are normal.  Musculoskeletal: Normal range of motion.  Lymphadenopathy:    He has no cervical adenopathy.  Neurological: He is alert and oriented to person, place, and time.  Skin: Skin is warm and dry. Rash noted. Rash is vesicular.  Scaly, peeling bilateral palms, no warmth, no tenderness, good neurovascular exam          Assessment & Plan:  Paul Warner was seen today for acute visit.  Diagnoses and all orders for this visit:  Irritant contact dermatitis due to detergent Likely contact dermatis from cleaning supplies, no other rashes, no consitutional symptoms, if not better or further symptoms let us know.  -     triamcinolone cream (KENALOG) 0.5 %; Apply 1 application  topically 2 (two) times daily.

## 2017-10-09 NOTE — Patient Instructions (Signed)

## 2017-11-17 ENCOUNTER — Ambulatory Visit: Payer: Self-pay | Admitting: Internal Medicine

## 2017-11-30 ENCOUNTER — Encounter: Payer: Self-pay | Admitting: Internal Medicine

## 2017-12-01 ENCOUNTER — Other Ambulatory Visit: Payer: Self-pay | Admitting: Internal Medicine

## 2017-12-13 ENCOUNTER — Other Ambulatory Visit: Payer: Self-pay | Admitting: Internal Medicine

## 2017-12-13 ENCOUNTER — Encounter: Payer: Self-pay | Admitting: Internal Medicine

## 2017-12-13 MED ORDER — TADALAFIL 20 MG PO TABS: ORAL_TABLET | ORAL | 5 refills | Status: DC

## 2017-12-15 ENCOUNTER — Ambulatory Visit: Payer: Self-pay | Admitting: Internal Medicine

## 2017-12-19 ENCOUNTER — Encounter: Payer: Self-pay | Admitting: Internal Medicine

## 2017-12-19 ENCOUNTER — Ambulatory Visit: Payer: BLUE CROSS/BLUE SHIELD | Admitting: Internal Medicine

## 2017-12-19 VITALS — BP 136/80 | HR 64 | Temp 97.6°F | Resp 18 | Ht 71.0 in | Wt 276.6 lb

## 2017-12-19 DIAGNOSIS — E782 Mixed hyperlipidemia: Secondary | ICD-10-CM | POA: Diagnosis not present

## 2017-12-19 DIAGNOSIS — Z79899 Other long term (current) drug therapy: Secondary | ICD-10-CM | POA: Diagnosis not present

## 2017-12-19 DIAGNOSIS — E1122 Type 2 diabetes mellitus with diabetic chronic kidney disease: Secondary | ICD-10-CM

## 2017-12-19 DIAGNOSIS — E559 Vitamin D deficiency, unspecified: Secondary | ICD-10-CM

## 2017-12-19 DIAGNOSIS — M1000 Idiopathic gout, unspecified site: Secondary | ICD-10-CM

## 2017-12-19 DIAGNOSIS — I10 Essential (primary) hypertension: Secondary | ICD-10-CM

## 2017-12-19 DIAGNOSIS — N182 Chronic kidney disease, stage 2 (mild): Secondary | ICD-10-CM

## 2017-12-19 NOTE — Patient Instructions (Signed)

## 2017-12-19 NOTE — Progress Notes (Signed)
This very nice 57 y.o.  MWMpresents for 6  month follow up with HTN, HLD, T2_NIDDM and Vitamin D Deficiency. Patient has hx/o poor compliance with meds. He has OSA and was intolerant to the Mask. He Has hx/o Gout and self d/c'd his gout meds. He has hx/o Colon adenomatous polyp in 2013 (Dr Henrene Pastor) and was recc a 5 yr f/u which he has not scheduled.      Patient is treated sporadically for HTN since 2001 & BP has been controlled at home. Today's BP is at goal - 136/80. Patient has had no complaints of any cardiac type chest pain, palpitations, dyspnea / orthopnea / PND, dizziness, claudication, or dependent edema.     Hyperlipidemia is controlled with diet & meds. Patient denies myalgias or other med SE's. Last Lipids were not at goal: Lab Results  Component Value Date   CHOL 184 08/17/2017   HDL 41 08/17/2017   LDLCALC 116 (H) 08/17/2017   TRIG 155 (H) 08/17/2017   CHOLHDL 4.5 08/17/2017      Also, the patient has history of Morbid Obesity (BMI 38+) and  T2_NIDDM (A1c 7.1%/July2017) and has had no symptoms of reactive hypoglycemia, diabetic polys, paresthesias or visual blurring.  He is inconsistent with his meds and last A1c was not at goal: Lab Results  Component Value Date   HGBA1C 6.8 (H) 08/17/2017      Patient has hx/o Low T ("208") and has stopped replacement for lack of perceived benefit.      Further, the patient also has history of Vitamin D Deficiency ("20" / 2008) and supplements vitamin D sporadically. Last vitamin D was still low:  Lab Results  Component Value Date   VD25OH 76 05/03/2017   Current Outpatient Medications on File Prior to Visit  Medication Sig  . allopurinol (ZYLOPRIM) 300 MG tablet TAKE 1/2 TO 1 TABLET DAILY TO PREVENT GOUT  . aspirin 81 MG tablet Take 81 mg by mouth daily. Reported on 09/23/2015  . atorvastatin (LIPITOR) 10 MG tablet Take 1 tablet (10 mg total) by mouth daily.  . bisoprolol-hydrochlorothiazide (ZIAC) 10-6.25 MG tablet TAKE 1 TABLET BY  MOUTH IN THE MORNING FOR BLOOD PRESSURE  . Cholecalciferol (VITAMIN D PO) Take 10,000 Units by mouth daily.  . meloxicam (MOBIC) 7.5 MG tablet TAKE 1 TABLET (7.5 MG TOTAL) BY MOUTH DAILY.  . metFORMIN (GLUCOPHAGE-XR) 500 MG 24 hr tablet TAKE 1 TO 2 TABLETS TWICE /DAILY AS DIRECTED FOR DIABETES  . OVER THE COUNTER MEDICATION Takes a MVI.  . tadalafil (CIALIS) 20 MG tablet Take 1/2 to 1 tablet every 2 to 3 days as needed for XXXX  . triamcinolone cream (KENALOG) 0.5 % Apply 1 application topically 2 (two) times daily.   No current facility-administered medications on file prior to visit.    Allergies  Allergen Reactions  . Codeine Nausea And Vomiting  . Nsaids Other (See Comments)    Bleeding ulcer 2013   PMHx:   Past Medical History:  Diagnosis Date  . Allergy    seasonal  . Blood transfusion 09/2011   2 units  . Hyperlipidemia   . Hypertension   . Renal disorder   . Stomach ulcer    Immunization History  Administered Date(s) Administered  . PPD Test 01/02/2014, 03/11/2015, 03/31/2016, 05/03/2017  . Pneumococcal Polysaccharide-23 10/04/2010, 05/03/2017  . Tdap 10/10/2011   Past Surgical History:  Procedure Laterality Date  . ESOPHAGOGASTRODUODENOSCOPY  10/26/2011   Procedure: ESOPHAGOGASTRODUODENOSCOPY (EGD);  Surgeon:  Irene Shipper, MD;  Location: [facility] ENDOSCOPY;  Service: Endoscopy;  Laterality: N/A;  . FRACTURE SURGERY  broken collar bone   FHx:    Reviewed / unchanged  SHx:    Reviewed / unchanged   Systems Review:  Constitutional: Denies fever, chills, wt changes, headaches, insomnia, fatigue, night sweats, change in appetite. Eyes: Denies redness, blurred vision, diplopia, discharge, itchy, watery eyes.  ENT: Denies discharge, congestion, post nasal drip, epistaxis, sore throat, earache, hearing loss, dental pain, tinnitus, vertigo, sinus pain, snoring.  CV: Denies chest pain, palpitations, irregular heartbeat, syncope, dyspnea, diaphoresis, orthopnea, PND,  claudication or edema. Respiratory: denies cough, dyspnea, DOE, pleurisy, hoarseness, laryngitis, wheezing.  Gastrointestinal: Denies dysphagia, odynophagia, heartburn, reflux, water brash, abdominal pain or cramps, nausea, vomiting, bloating, diarrhea, constipation, hematemesis, melena, hematochezia  or hemorrhoids. Genitourinary: Denies dysuria, frequency, urgency, nocturia, hesitancy, discharge, hematuria or flank pain. Musculoskeletal: Denies arthralgias, myalgias, stiffness, jt. swelling, pain, limping or strain/sprain.  Skin: Denies pruritus, rash, hives, warts, acne, eczema or change in skin lesion(s). Neuro: No weakness, tremor, incoordination, spasms, paresthesia or pain. Psychiatric: Denies confusion, memory loss or sensory loss. Endo: Denies change in weight, skin or hair change.  Heme/Lymph: No excessive bleeding, bruising or enlarged lymph nodes.  Physical Exam  BP 136/80   Pulse 64   Temp 97.6 F (36.4 C)   Resp 18   Ht 5\' 11"  (1.803 m)   Wt 276 lb 9.6 oz (125.5 kg)   SpO2 95%   BMI 38.58 kg/m   Appears  well nourished, well groomed  and in no distress.  Eyes: PERRLA, EOMs, conjunctiva no swelling or erythema. Sinuses: No frontal/maxillary tenderness ENT/Mouth: EAC's clear, TM's nl w/o erythema, bulging. Nares clear w/o erythema, swelling, exudates. Oropharynx clear without erythema or exudates. Oral hygiene is good. Tongue normal, non obstructing. Hearing intact.  Neck: Supple. Thyroid not palpable. Car 2+/2+ without bruits, nodes or JVD. Chest: Respirations nl with BS clear & equal w/o rales, rhonchi, wheezing or stridor.  Cor: Heart sounds normal w/ regular rate and rhythm without sig. murmurs, gallops, clicks or rubs. Peripheral pulses normal and equal  without edema.  Abdomen: Soft & bowel sounds normal. Non-tender w/o guarding, rebound, hernias, masses or organomegaly.  Lymphatics: Unremarkable.  Musculoskeletal: Full ROM all peripheral extremities, joint  stability, 5/5 strength and normal gait.  Skin: Warm, dry without exposed rashes, lesions or ecchymosis apparent.  Neuro: Cranial nerves intact, reflexes equal bilaterally. Sensory-motor testing grossly intact. Tendon reflexes grossly intact.  Pysch: Alert & oriented x 3.  Insight and judgement nl & appropriate. No ideations.  Assessment and Plan:  1. Essential hypertension  - Continue medication, monitor blood pressure at home.  - Continue DASH diet.  Reminder to go to the ER if any CP,  SOB, nausea, dizziness, severe HA, changes vision/speech.  - CBC with Differential/Platelet - COMPLETE METABOLIC PANEL WITH GFR - Magnesium - TSH  2. Hyperlipidemia, mixed  - Continue diet/meds, exercise,& lifestyle modifications.  - Continue monitor periodic cholesterol/liver & renal functions   - Lipid panel - TSH  3. 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 Vitamin D deficiency  - Continue diet, exercise, lifestyle modifications.  - Monitor appropriate labs. - Continue supplementation.  - VITAMIN D 25 Hydroxyl  5. Idiopathic gout  - Uric acid  6. Medication management  - CBC with Differential/Platelet - COMPLETE METABOLIC PANEL WITH GFR - Magnesium - Lipid panel -  TSH - Hemoglobin A1c - Insulin, random - VITAMIN D 25 Hydroxyl - Uric acid      Discussed  regular exercise, BP monitoring, weight control to achieve/maintain BMI less than 25 and discussed med and SE's. Recommended labs to assess and monitor clinical status with further disposition pending results of labs. Over 30 minutes of exam, counseling, chart review was performed.

## 2017-12-20 LAB — CBC WITH DIFFERENTIAL/PLATELET
Basophils Absolute: 38 cells/uL (ref 0–200)
Basophils Relative: 0.6 %
Eosinophils Absolute: 170 cells/uL (ref 15–500)
Eosinophils Relative: 2.7 %
HCT: 46.9 % (ref 38.5–50.0)
Hemoglobin: 16.2 g/dL (ref 13.2–17.1)
Lymphs Abs: 2098 cells/uL (ref 850–3900)
MCH: 31.3 pg (ref 27.0–33.0)
MCHC: 34.5 g/dL (ref 32.0–36.0)
MCV: 90.7 fL (ref 80.0–100.0)
MPV: 10.1 fL (ref 7.5–12.5)
Monocytes Relative: 8.6 %
Neutro Abs: 3452 cells/uL (ref 1500–7800)
Neutrophils Relative %: 54.8 %
Platelets: 217 10*3/uL (ref 140–400)
RBC: 5.17 10*6/uL (ref 4.20–5.80)
RDW: 12 % (ref 11.0–15.0)
Total Lymphocyte: 33.3 %
WBC mixed population: 542 cells/uL (ref 200–950)
WBC: 6.3 10*3/uL (ref 3.8–10.8)

## 2017-12-20 LAB — COMPLETE METABOLIC PANEL WITH GFR
AG Ratio: 1.7 (calc) (ref 1.0–2.5)
ALT: 37 U/L (ref 9–46)
AST: 27 U/L (ref 10–35)
Albumin: 4.6 g/dL (ref 3.6–5.1)
Alkaline phosphatase (APISO): 59 U/L (ref 40–115)
BUN: 14 mg/dL (ref 7–25)
CO2: 24 mmol/L (ref 20–32)
Calcium: 9.7 mg/dL (ref 8.6–10.3)
Chloride: 104 mmol/L (ref 98–110)
Creat: 0.98 mg/dL (ref 0.70–1.33)
GFR, Est African American: 99 mL/min/{1.73_m2} (ref >=60)
GFR, Est Non African American: 86 mL/min/{1.73_m2} (ref >=60)
Globulin: 2.7 g/dL (calc) (ref 1.9–3.7)
Glucose, Bld: 196 mg/dL — ABNORMAL HIGH (ref 65–99)
Potassium: 3.9 mmol/L (ref 3.5–5.3)
Sodium: 140 mmol/L (ref 135–146)
Total Bilirubin: 0.8 mg/dL (ref 0.2–1.2)
Total Protein: 7.3 g/dL (ref 6.1–8.1)

## 2017-12-20 LAB — HEMOGLOBIN A1C
Hgb A1c MFr Bld: 7.4 % of total Hgb — ABNORMAL HIGH (ref <5.7)
Mean Plasma Glucose: 166 (calc)
eAG (mmol/L): 9.2 (calc)

## 2017-12-20 LAB — LIPID PANEL
Cholesterol: 200 mg/dL — ABNORMAL HIGH (ref <200)
HDL: 40 mg/dL — ABNORMAL LOW (ref >40)
LDL Cholesterol (Calc): 121 mg/dL (calc) — ABNORMAL HIGH
Non-HDL Cholesterol (Calc): 160 mg/dL (calc) — ABNORMAL HIGH (ref <130)
Total CHOL/HDL Ratio: 5 (calc) — ABNORMAL HIGH (ref <5.0)
Triglycerides: 252 mg/dL — ABNORMAL HIGH (ref <150)

## 2017-12-20 LAB — TSH: TSH: 2.55 mIU/L (ref 0.40–4.50)

## 2017-12-20 LAB — URIC ACID: Uric Acid, Serum: 7.9 mg/dL (ref 4.0–8.0)

## 2017-12-20 LAB — VITAMIN D 25 HYDROXY (VIT D DEFICIENCY, FRACTURES): Vit D, 25-Hydroxy: 30 ng/mL (ref 30–100)

## 2017-12-20 LAB — INSULIN, RANDOM: Insulin: 91 u[IU]/mL — ABNORMAL HIGH (ref 2.0–19.6)

## 2017-12-20 LAB — MAGNESIUM: Magnesium: 1.8 mg/dL (ref 1.5–2.5)

## 2018-03-22 ENCOUNTER — Ambulatory Visit: Payer: Self-pay | Admitting: Adult Health

## 2018-03-28 NOTE — Progress Notes (Signed)
FOLLOW UP  Assessment and Plan:   Hypertension Elevated; off of meds x 2 weeks, will transition to losartan/hctz due to DM Monitor blood pressure at home; patient to call if consistently greater than 130/80 Continue DASH diet.   Reminder to go to the ER if any CP, SOB, nausea, dizziness, severe HA, changes vision/speech, left arm numbness and tingling and jaw pain.  Cholesterol Currently above goal; has been off of atorvastatin 10 mg daily - restart, discussed goal LDL <70 Continue low cholesterol diet and exercise.  Check lipid panel.   Diabetes with diabetic chronic kidney disease Continue medication: metformin - restart, taper up to 4 tabs daily over meals Continue diet and exercise.  Perform daily foot/skin check, notify office of any concerning changes.  Check A1C  Morbid Obesity with co morbidities Long discussion about weight loss, diet, and exercise Recommended diet heavy in fruits and veggies and low in animal meats, cheeses, and dairy products, appropriate calorie intake Discussed ideal weight for height  Patient has done excellent work with cutting out soda, discussed improved breakfast and afternoon snack options Will follow up in 3 months  Vitamin D Def Continue supplementation, discussed goal of 70-100 Check vitamin D level  Gout Patient preference is currently off allopurinol, no recent flares since cutting out soda Diet discussed Check uric acid as needed   Continue diet and meds as discussed. Further disposition pending results of labs. Discussed med's effects and SE's.   Over 30 minutes of exam, counseling, chart review, and critical decision making was performed.   Future Appointments  Date Time Provider [place]  06/04/2018  9:00 AM [name], MD [facility] None    ----------------------------------------------------------------------------------------------------------------------  HPI 57 y.o. male  presents for 3 month follow up  on hypertension, cholesterol, diabetes, morbid obesity, gout and vitamin D deficiency.   BMI is Body mass index is 38.49 kg/m., he has been working on and exercise. Started walking last week - has not been working on diet. He has cut out soda, sweet tea, drinking water and unsweet tea, estimaed 72 fluid ounces daily.  Wt Readings from Last 3 Encounters:  03/29/18 276 lb (125.2 kg)  12/19/17 276 lb 9.6 oz (125.5 kg)  10/09/17 277 lb 9.6 oz (125.9 kg)   Breakfast: 3 days a week, might have a pop tart or biscuit sandwich, or egg omelett if he has time Snack: may stop for fruit and cheese Lunch: stops on the road, [employer] roast beef sandwich, throws away the bread or cheese sticks.  Snack: may stop for a bag of chips or pretzels  Dinner: Varies, wife cooks, veggie soup last night, might have burgers  He has not been checking BP at home, today their BP is BP: (!) 146/92  He does workout. He denies chest pain, shortness of breath, dizziness.   He is on cholesterol medication (atorvastatin 10 mg daily, but hasn't been taking last 2 weeks) and denies myalgias. His cholesterol is not at goal. The cholesterol last visit was:   Lab Results  Component Value Date   CHOL 200 (H) 12/19/2017   HDL 40 (L) 12/19/2017   LDLCALC 121 (H) 12/19/2017   TRIG 252 (H) 12/19/2017   CHOLHDL 5.0 (H) 12/19/2017    He has not been working on diet and exercise for T2 diabetes on metformin 1000 mg daily (off for thast 2 weeks), and denies foot ulcerations, hyperglycemia, increased appetite, nausea, paresthesia of the feet, polydipsia, polyuria, visual disturbances, vomiting and weight loss. He does  not have meter to check sugars.  Last A1C in the office was:  Lab Results  Component Value Date   HGBA1C 7.4 (H) 12/19/2017   Patient is on Vitamin D supplement but remained well below goal of 70 at last check:   Lab Results  Component Value Date   VD25OH 30 12/19/2017     Patient is prescribed allopurinol for gout  buty and does not report a recent flare.  Lab Results  Component Value Date   LABURIC 7.9 12/19/2017      Current Medications:  Current Outpatient Medications on File Prior to Visit  Medication Sig  . allopurinol (ZYLOPRIM) 300 MG tablet TAKE 1/2 TO 1 TABLET DAILY TO PREVENT GOUT  . aspirin 81 MG tablet Take 81 mg by mouth daily. Reported on 09/23/2015  . atorvastatin (LIPITOR) 10 MG tablet Take 1 tablet (10 mg total) by mouth daily.  . bisoprolol-hydrochlorothiazide (ZIAC) 10-6.25 MG tablet TAKE 1 TABLET BY MOUTH IN THE MORNING FOR BLOOD PRESSURE  . Cholecalciferol (VITAMIN D PO) Take 10,000 Units by mouth daily.  . metFORMIN (GLUCOPHAGE-XR) 500 MG 24 hr tablet TAKE 1 TO 2 TABLETS TWICE /DAILY AS DIRECTED FOR DIABETES  . OVER THE COUNTER MEDICATION Takes a MVI.  . tadalafil (CIALIS) 20 MG tablet Take 1/2 to 1 tablet every 2 to 3 days as needed for XXXX  . triamcinolone cream (KENALOG) 0.5 % Apply 1 application topically 2 (two) times daily.   No current facility-administered medications on file prior to visit.      Allergies:  Allergies  Allergen Reactions  . Codeine Nausea And Vomiting  . Nsaids Other (See Comments)    Bleeding ulcer 2013     Medical History:  Past Medical History:  Diagnosis Date  . Allergy    seasonal  . Blood transfusion 09/2011   2 units  . Hyperlipidemia   . Hypertension   . Renal disorder   . Stomach ulcer    Family history- Reviewed and unchanged Social history- Reviewed and unchanged   Review of Systems:  Review of Systems  Constitutional: Negative for malaise/fatigue and weight loss.  HENT: Negative for hearing loss and tinnitus.   Eyes: Negative for blurred vision and double vision.  Respiratory: Negative for cough, shortness of breath and wheezing.   Cardiovascular: Negative for chest pain, palpitations, orthopnea, claudication and leg swelling.  Gastrointestinal: Negative for abdominal pain, blood in stool, constipation, diarrhea,  heartburn, melena, nausea and vomiting.  Genitourinary: Negative.   Musculoskeletal: Negative for joint pain and myalgias.  Skin: Negative for rash.  Neurological: Negative for dizziness, tingling, sensory change, weakness and headaches.  Endo/Heme/Allergies: Negative for polydipsia.  Psychiatric/Behavioral: Negative.   All other systems reviewed and are negative.    Physical Exam: BP (!) 146/92   Pulse 87   Temp 97.9 F (36.6 C)   Ht 5\' 11"  (1.803 m)   Wt 276 lb (125.2 kg)   SpO2 97%   BMI 38.49 kg/m  Wt Readings from Last 3 Encounters:  03/29/18 276 lb (125.2 kg)  12/19/17 276 lb 9.6 oz (125.5 kg)  10/09/17 277 lb 9.6 oz (125.9 kg)   General Appearance: Well nourished, in no apparent distress. Eyes: PERRLA, EOMs, conjunctiva no swelling or erythema Sinuses: No Frontal/maxillary tenderness ENT/Mouth: Ext aud canals clear, TMs without erythema, bulging. No erythema, swelling, or exudate on post pharynx.  Tonsils not swollen or erythematous. Hearing normal.  Neck: Supple, thyroid normal.  Respiratory: Respiratory effort normal, BS equal bilaterally without  rales, rhonchi, wheezing or stridor.  Cardio: RRR with no MRGs. Brisk peripheral pulses without edema.  Abdomen: Soft, + BS.  Non tender, no guarding, rebound, hernias, masses. Lymphatics: Non tender without lymphadenopathy.  Musculoskeletal: Full ROM, 5/5 strength, Normal gait Skin: Warm, dry without rashes, lesions, ecchymosis.  Neuro: Cranial nerves intact. No cerebellar symptoms.  Psych: Awake and oriented X 3, normal affect, Insight and Judgment appropriate.    Izora Ribas, NP 11:26 AM [facility]

## 2018-03-29 ENCOUNTER — Ambulatory Visit: Payer: BLUE CROSS/BLUE SHIELD | Admitting: Adult Health

## 2018-03-29 ENCOUNTER — Encounter: Payer: Self-pay | Admitting: Adult Health

## 2018-03-29 VITALS — BP 146/92 | HR 87 | Temp 97.9°F | Ht 71.0 in | Wt 276.0 lb

## 2018-03-29 DIAGNOSIS — E785 Hyperlipidemia, unspecified: Secondary | ICD-10-CM

## 2018-03-29 DIAGNOSIS — E559 Vitamin D deficiency, unspecified: Secondary | ICD-10-CM | POA: Diagnosis not present

## 2018-03-29 DIAGNOSIS — Z79899 Other long term (current) drug therapy: Secondary | ICD-10-CM | POA: Diagnosis not present

## 2018-03-29 DIAGNOSIS — N182 Chronic kidney disease, stage 2 (mild): Secondary | ICD-10-CM

## 2018-03-29 DIAGNOSIS — E1122 Type 2 diabetes mellitus with diabetic chronic kidney disease: Secondary | ICD-10-CM

## 2018-03-29 DIAGNOSIS — E119 Type 2 diabetes mellitus without complications: Secondary | ICD-10-CM | POA: Diagnosis not present

## 2018-03-29 DIAGNOSIS — E1169 Type 2 diabetes mellitus with other specified complication: Secondary | ICD-10-CM

## 2018-03-29 DIAGNOSIS — I10 Essential (primary) hypertension: Secondary | ICD-10-CM | POA: Diagnosis not present

## 2018-03-29 DIAGNOSIS — M1000 Idiopathic gout, unspecified site: Secondary | ICD-10-CM

## 2018-03-29 MED ORDER — LOSARTAN POTASSIUM-HCTZ 50-12.5 MG PO TABS: 1.0000 | ORAL_TABLET | Freq: Every day | ORAL | 1 refills | Status: DC

## 2018-03-29 MED ORDER — MELOXICAM 7.5 MG PO TABS: ORAL_TABLET | ORAL | 99 refills | Status: DC

## 2018-03-29 NOTE — Patient Instructions (Signed)
Goals    . Blood Pressure < 130/80    . HEMOGLOBIN A1C < 7.0    . LDL CALC < 70    . Weight (lb) < 250 lb (113.4 kg)        Get a scale, weigh weekly and keep a log  Get a blood pressure cuff, or check a few times a month at the pharmacy (keep a log)   Check freezer aisle for easy breakfast, continue with good lunch choices  Try to do fruit/nuts/veggies/etc as discussed for snacks  Keep up the great work with fluid intake, avoiding sodas   Taking medications regularly/daily is VERY important -   Please check your feet daily   Know what a healthy weight is for you (roughly BMI <25) and aim to maintain this  Aim for 7+ servings of fruits and vegetables daily  65-80+ fluid ounces of water or unsweet tea for healthy kidneys  Limit to max 1 drink of alcohol per day; avoid smoking/tobacco  Limit animal fats in diet for cholesterol and heart health - choose grass fed whenever available  Avoid highly processed foods, and foods high in saturated/trans fats  Aim for low stress - take time to unwind and care for your mental health  Aim for 150 min of moderate intensity exercise weekly for heart health, and weights twice weekly for bone health  Aim for 7-9 hours of sleep daily     Diabetes and Foot Care Diabetes may cause you to have problems because of poor blood supply (circulation) to your feet and legs. This may cause the skin on your feet to become thinner, break easier, and heal more slowly. Your skin may become dry, and the skin may peel and crack. You may also have nerve damage in your legs and feet causing decreased feeling in them. You may not notice minor injuries to your feet that could lead to infections or more serious problems. Taking care of your feet is one of the most important things you can do for yourself. Follow these instructions at home:  Wear shoes at all times, even in the house. Do not go barefoot. Bare feet are easily injured.  Check your feet  daily for blisters, cuts, and redness. If you cannot see the bottom of your feet, use a mirror or ask someone for help.  Wash your feet with warm water (do not use hot water) and mild soap. Then pat your feet and the areas between your toes until they are completely dry. Do not soak your feet as this can dry your skin.  Apply a moisturizing lotion or petroleum jelly (that does not contain alcohol and is unscented) to the skin on your feet and to dry, brittle toenails. Do not apply lotion between your toes.  Trim your toenails straight across. Do not dig under them or around the cuticle. File the edges of your nails with an emery board or nail file.  Do not cut corns or calluses or try to remove them with medicine.  Wear clean socks or stockings every day. Make sure they are not too tight. Do not wear knee-high stockings since they may decrease blood flow to your legs.  Wear shoes that fit properly and have enough cushioning. To break in new shoes, wear them for just a few hours a day. This prevents you from injuring your feet. Always look in your shoes before you put them on to be sure there are no objects inside.  Do not  cross your legs. This may decrease the blood flow to your feet.  If you find a minor scrape, cut, or break in the skin on your feet, keep it and the skin around it clean and dry. These areas may be cleansed with mild soap and water. Do not cleanse the area with peroxide, alcohol, or iodine.  When you remove an adhesive bandage, be sure not to damage the skin around it.  If you have a wound, look at it several times a day to make sure it is healing.  Do not use heating pads or hot water bottles. They may burn your skin. If you have lost feeling in your feet or legs, you may not know it is happening until it is too late.  Make sure your health care provider performs a complete foot exam at least annually or more often if you have foot problems. Report any cuts, sores, or  bruises to your health care provider immediately. Contact a health care provider if:  You have an injury that is not healing.  You have cuts or breaks in the skin.  You have an ingrown nail.  You notice redness on your legs or feet.  You feel burning or tingling in your legs or feet.  You have pain or cramps in your legs and feet.  Your legs or feet are numb.  Your feet always feel cold. Get help right away if:  There is increasing redness, swelling, or pain in or around a wound.  There is a red line that goes up your leg.  Pus is coming from a wound.  You develop a fever or as directed by your health care provider.  You notice a bad smell coming from an ulcer or wound. This information is not intended to replace advice given to you by your health care provider. Make sure you discuss any questions you have with your health care provider. Document Released: 05/13/2000 Document Revised: 10/22/2015 Document Reviewed: 10/23/2012 Elsevier Interactive Patient Education  2017 [employer].    Hydrochlorothiazide, HCTZ; Losartan tablets What is this medicine? LOSARTAN; HYDROCHLOROTHIAZIDE (loe SAR tan; hye droe klor oh THYE a zide) is a combination of a drug that relaxes blood vessels and a diuretic. It is used to treat high blood pressure. This medicine may also reduce the risk of stroke in certain patients. This medicine may be used for other purposes; ask your health care provider or pharmacist if you have questions. COMMON BRAND NAME(S): Hyzaar What should I tell my health care provider before I take this medicine? They need to know if you have any of these conditions: -decreased urine -kidney disease -liver disease -if you are on a special diet, like a low-salt diet -immune system problems, like lupus -an unusual or allergic reaction to losartan, hydrochlorothiazide, sulfa drugs, other medicines, foods, dyes, or preservatives -pregnant or trying to get  pregnant -breast-feeding How should I use this medicine? Take this medicine by mouth with a glass of water. Follow the directions on the prescription label. You can take it with or without food. If it upsets your stomach, take it with food. Take your medicine at regular intervals. Do not take it more often than directed. Do not stop taking except on your doctor's advice. Talk to your pediatrician regarding the use of this medicine in children. Special care may be needed. Overdosage: If you think you have taken too much of this medicine contact a poison control center or emergency room at once. NOTE: This  medicine is only for you. Do not share this medicine with others. What if I miss a dose? If you miss a dose, take it as soon as you can. If it is almost time for your next dose, take only that dose. Do not take double or extra doses. What may interact with this medicine? -barbiturates, like phenobarbital -blood pressure medicines -celecoxib -cimetidine -corticosteroids -diabetic medicines -diuretics, especially triamterene, spironolactone or amiloride -fluconazole -lithium -NSAIDs, medicines for pain and inflammation, like ibuprofen or naproxen -potassium salts or potassium supplements -prescription pain medicines -rifampin -skeletal muscle relaxants like tubocurarine -some cholesterol-lowering medicines like cholestyramine or colestipol This list may not describe all possible interactions. Give your health care provider a list of all the medicines, herbs, non-prescription drugs, or dietary supplements you use. Also tell them if you smoke, drink alcohol, or use illegal drugs. Some items may interact with your medicine. What should I watch for while using this medicine? Check your blood pressure regularly while you are taking this medicine. Ask your doctor or health care professional what your blood pressure should be and when you should contact him or her. When you check your blood  pressure, write down the measurements to show your doctor or health care professional. If you are taking this medicine for a long time, you must visit your health care professional for regular checks on your progress. Make sure you schedule appointments on a regular basis. You must not get dehydrated. Ask your doctor or health care professional how much fluid you need to drink a day. Check with him or her if you get an attack of severe diarrhea, nausea and vomiting, or if you sweat a lot. The loss of too much body fluid can make it dangerous for you to take this medicine. Women should inform their doctor if they wish to become pregnant or think they might be pregnant. There is a potential for serious side effects to an unborn child, particularly in the second or third trimester. Talk to your health care professional or pharmacist for more information. You may get drowsy or dizzy. Do not drive, use machinery, or do anything that needs mental alertness until you know how this drug affects you. Do not stand or sit up quickly, especially if you are an older patient. This reduces the risk of dizzy or fainting spells. Alcohol can make you more drowsy and dizzy. Avoid alcoholic drinks. This medicine may affect your blood sugar level. If you have diabetes, check with your doctor or health care professional before changing the dose of your diabetic medicine. Avoid salt substitutes unless you are told otherwise by your doctor or health care professional. Do not treat yourself for coughs, colds, or pain while you are taking this medicine without asking your doctor or health care professional for advice. Some ingredients may increase your blood pressure. What side effects may I notice from receiving this medicine? Side effects that you should report to your doctor or health care professional as soon as possible: -allergic reactions like skin rash, itching or hives, swelling of the face, lips, or tongue -breathing  problems -changes in vision -dark urine -eye pain -fast or irregular heart beat, palpitations, or chest pain -feeling faint or lightheaded -muscle cramps -persistent dry cough -redness, blistering, peeling or loosening of the skin, including inside the mouth -stomach pain -trouble passing urine or change in the amount of urine -unusual bleeding or bruising -worsened gout pain -yellowing of the eyes or skin Side effects that usually do not require  medical attention (report to your doctor or health care professional if they continue or are bothersome): -change in sex drive or performance -headache This list may not describe all possible side effects. Call your doctor for medical advice about side effects. You may report side effects to FDA at 1-800-FDA-1088. Where should I keep my medicine? Keep out of the reach of children. Store at room temperature between 15 and 30 degrees C (59 and 86 degrees F). Protect from light. Keep container tightly closed. Throw away any unused medicine after the expiration date. NOTE: This sheet is a summary. It may not cover all possible information. If you have questions about this medicine, talk to your doctor, pharmacist, or health care provider.  2018 Elsevier/Gold Standard (2010-02-03 13:57:32)

## 2018-03-30 ENCOUNTER — Encounter: Payer: Self-pay | Admitting: Adult Health

## 2018-03-30 DIAGNOSIS — R945 Abnormal results of liver function studies: Secondary | ICD-10-CM

## 2018-03-30 DIAGNOSIS — R7989 Other specified abnormal findings of blood chemistry: Secondary | ICD-10-CM | POA: Insufficient documentation

## 2018-03-30 LAB — CBC WITH DIFFERENTIAL/PLATELET
Basophils Absolute: 37 cells/uL (ref 0–200)
Basophils Relative: 0.6 %
Eosinophils Absolute: 180 cells/uL (ref 15–500)
Eosinophils Relative: 2.9 %
HCT: 47.9 % (ref 38.5–50.0)
Hemoglobin: 16.8 g/dL (ref 13.2–17.1)
Lymphs Abs: 2288 cells/uL (ref 850–3900)
MCH: 31.8 pg (ref 27.0–33.0)
MCHC: 35.1 g/dL (ref 32.0–36.0)
MCV: 90.7 fL (ref 80.0–100.0)
MPV: 10.2 fL (ref 7.5–12.5)
Monocytes Relative: 6.9 %
Neutro Abs: 3267 cells/uL (ref 1500–7800)
Neutrophils Relative %: 52.7 %
Platelets: 238 10*3/uL (ref 140–400)
RBC: 5.28 10*6/uL (ref 4.20–5.80)
RDW: 11.8 % (ref 11.0–15.0)
Total Lymphocyte: 36.9 %
WBC mixed population: 428 cells/uL (ref 200–950)
WBC: 6.2 10*3/uL (ref 3.8–10.8)

## 2018-03-30 LAB — COMPLETE METABOLIC PANEL WITH GFR
AG Ratio: 1.5 (calc) (ref 1.0–2.5)
ALT: 49 U/L — ABNORMAL HIGH (ref 9–46)
AST: 41 U/L — ABNORMAL HIGH (ref 10–35)
Albumin: 4.4 g/dL (ref 3.6–5.1)
Alkaline phosphatase (APISO): 60 U/L (ref 40–115)
BUN: 13 mg/dL (ref 7–25)
CO2: 27 mmol/L (ref 20–32)
Calcium: 9.6 mg/dL (ref 8.6–10.3)
Chloride: 103 mmol/L (ref 98–110)
Creat: 0.98 mg/dL (ref 0.70–1.33)
GFR, Est African American: 99 mL/min/{1.73_m2} (ref >=60)
GFR, Est Non African American: 85 mL/min/{1.73_m2} (ref >=60)
Globulin: 3 g/dL (calc) (ref 1.9–3.7)
Glucose, Bld: 185 mg/dL — ABNORMAL HIGH (ref 65–99)
Potassium: 4.3 mmol/L (ref 3.5–5.3)
Sodium: 139 mmol/L (ref 135–146)
Total Bilirubin: 0.9 mg/dL (ref 0.2–1.2)
Total Protein: 7.4 g/dL (ref 6.1–8.1)

## 2018-03-30 LAB — VITAMIN D 25 HYDROXY (VIT D DEFICIENCY, FRACTURES): Vit D, 25-Hydroxy: 34 ng/mL (ref 30–100)

## 2018-03-30 LAB — LIPID PANEL
Cholesterol: 198 mg/dL (ref <200)
HDL: 42 mg/dL (ref >40)
LDL Cholesterol (Calc): 122 mg/dL (calc) — ABNORMAL HIGH
Non-HDL Cholesterol (Calc): 156 mg/dL (calc) — ABNORMAL HIGH (ref <130)
Total CHOL/HDL Ratio: 4.7 (calc) (ref <5.0)
Triglycerides: 223 mg/dL — ABNORMAL HIGH (ref <150)

## 2018-03-30 LAB — TSH: TSH: 2.76 mIU/L (ref 0.40–4.50)

## 2018-03-30 LAB — HEMOGLOBIN A1C
Hgb A1c MFr Bld: 7.4 % of total Hgb — ABNORMAL HIGH (ref <5.7)
Mean Plasma Glucose: 166 (calc)
eAG (mmol/L): 9.2 (calc)

## 2018-03-30 LAB — MAGNESIUM: Magnesium: 1.8 mg/dL (ref 1.5–2.5)

## 2018-05-11 NOTE — Progress Notes (Signed)
Assessment and Plan:  Olden was seen today for follow-up.  Diagnoses and all orders for this visit:  Essential hypertension Initiate medication:  losartan-hydrochlorothiazide (HYZAAR) 100-12.5 MG tablet; Take 1 tablet by mouth daily. Buy BP cuff today and keep log Monitor blood pressure at home; call if consistently over 130/80 Discussed DASH diet Advised to go to the ER if any CP, SOB, nausea, dizziness, severe HA, changes vision/speech, left arm numbness and tingling and jaw pain. Follow up in Jan as scheduled -     BASIC METABOLIC PANEL WITH GFR  Morbid obesity ([place]) Long discussion about weight loss, diet, and exercise Recommended diet heavy in fruits and veggies and low in animal meats, cheeses, and dairy products, appropriate calorie intake Patient will work on walking daily in AM prior to work, watching portion sizes Discussed appropriate weight for height and initial goal (<250lb) Follow up at next visit  Further disposition pending results of labs. Discussed med's effects and SE's.   Over 15 minutes of exam, counseling, chart review, and critical decision making was performed.   Future Appointments  Date Time Provider [place]  06/04/2018  9:00 AM [name], MD [facility] None    ------------------------------------------------------------------------------------------------------------------   HPI BP (!) 142/88   Pulse 95   Temp (!) 97.5 F (36.4 C)   Ht 5\' 11"  (1.803 m)   Wt 274 lb (124.3 kg)   SpO2 99%   BMI 38.22 kg/m   57 y.o.male with hx of T2DM presents for 6 week follow up on BP; he was off of medications at last visit, and was transitioned at that time to losartan-hctz 50-12.5 mg, taking 1 tab daily.   He has not been checking at home, hasn't bought BP cuff yet, today their BP is BP: (!) 142/88 He denies chest pain, shortness of breath, dizziness.  BMI is Body mass index is 38.22 kg/m., he has been working on diet and exercise. Wt  Readings from Last 3 Encounters:  05/14/18 274 lb (124.3 kg)  03/29/18 276 lb (125.2 kg)  12/19/17 276 lb 9.6 oz (125.5 kg)    Past Medical History:  Diagnosis Date  . Allergy    seasonal  . Blood transfusion 09/2011   2 units  . Hyperlipidemia   . Hypertension   . Renal disorder   . Stomach ulcer      Allergies  Allergen Reactions  . Codeine Nausea And Vomiting  . Nsaids Other (See Comments)    Bleeding ulcer 2013    Current Outpatient Medications on File Prior to Visit  Medication Sig  . allopurinol (ZYLOPRIM) 300 MG tablet TAKE 1/2 TO 1 TABLET DAILY TO PREVENT GOUT  . aspirin 81 MG tablet Take 81 mg by mouth daily. Reported on 09/23/2015  . atorvastatin (LIPITOR) 10 MG tablet Take 1 tablet (10 mg total) by mouth daily.  . Cholecalciferol (VITAMIN D PO) Take 10,000 Units by mouth daily.  . meloxicam (MOBIC) 7.5 MG tablet TAKE 1 TABLET (7.5 MG TOTAL) BY MOUTH AS NEEDED  . metFORMIN (GLUCOPHAGE-XR) 500 MG 24 hr tablet TAKE 1 TO 2 TABLETS TWICE /DAILY AS DIRECTED FOR DIABETES  . OVER THE COUNTER MEDICATION Takes a MVI.  . tadalafil (CIALIS) 20 MG tablet Take 1/2 to 1 tablet every 2 to 3 days as needed for XXXX  . triamcinolone cream (KENALOG) 0.5 % Apply 1 application topically 2 (two) times daily.   No current facility-administered medications on file prior to visit.     ROS: all negative  except above.   Physical Exam:  BP (!) 142/88   Pulse 95   Temp (!) 97.5 F (36.4 C)   Ht 5\' 11"  (1.803 m)   Wt 274 lb (124.3 kg)   SpO2 99%   BMI 38.22 kg/m   General Appearance: Well nourished, in no apparent distress. Eyes: conjunctiva no swelling or erythema ENT/Mouth: Hearing normal.  Neck: Supple, thyroid normal.  Respiratory: Respiratory effort normal, BS equal bilaterally without rales, rhonchi, wheezing or stridor.  Cardio: RRR with no MRGs. Brisk peripheral pulses without edema.  Lymphatics: Non tender without lymphadenopathy.  Musculoskeletal: Symemtrical  strength, normal gait.  Skin: Warm, dry without rashes, lesions, ecchymosis.  Neuro: Cranial nerves intact. Normal muscle tone, no cerebellar symptoms.  Psych: Awake and oriented X 3, normal affect, Insight and Judgment appropriate.     Izora Ribas, NP 9:16 AM [facility]

## 2018-05-14 ENCOUNTER — Ambulatory Visit: Payer: BLUE CROSS/BLUE SHIELD | Admitting: Adult Health

## 2018-05-14 ENCOUNTER — Encounter: Payer: Self-pay | Admitting: Adult Health

## 2018-05-14 VITALS — BP 142/88 | HR 95 | Temp 97.5°F | Ht 71.0 in | Wt 274.0 lb

## 2018-05-14 DIAGNOSIS — I10 Essential (primary) hypertension: Secondary | ICD-10-CM | POA: Diagnosis not present

## 2018-05-14 MED ORDER — LOSARTAN POTASSIUM-HCTZ 100-12.5 MG PO TABS: 1.0000 | ORAL_TABLET | Freq: Every day | ORAL | 1 refills | Status: DC

## 2018-05-14 NOTE — Patient Instructions (Signed)
Goals    . Blood Pressure < 130/80    . HEMOGLOBIN A1C < 7.0    . LDL CALC < 70    . Weight (lb) < 250 lb (113.4 kg)        HYPERTENSION INFORMATION  Monitor your blood pressure at home, please keep a record and bring that in with you to your next office visit.   Go to the ER if any CP, SOB, nausea, dizziness, severe HA, changes vision/speech  Your most recent BP: BP: (!) 142/88   Take your medications faithfully as instructed. Maintain a healthy weight. Get at least 150 minutes of aerobic exercise per week. Minimize salt intake. Minimize alcohol intake  DASH Eating Plan DASH stands for "Dietary Approaches to Stop Hypertension." The DASH eating plan is a healthy eating plan that has been shown to reduce high blood pressure (hypertension). Additional health benefits may include reducing the risk of type 2 diabetes mellitus, heart disease, and stroke. The DASH eating plan may also help with weight loss. WHAT DO I NEED TO KNOW ABOUT THE DASH EATING PLAN? For the DASH eating plan, you will follow these general guidelines:  Choose foods with a percent daily value for sodium of less than 5% (as listed on the food label).  Use salt-free seasonings or herbs instead of table salt or sea salt.  Check with your health care provider or pharmacist before using salt substitutes.  Eat lower-sodium products, often labeled as "lower sodium" or "no salt added."  Eat fresh foods.  Eat more vegetables, fruits, and low-fat dairy products.  Choose whole grains. Look for the word "whole" as the first word in the ingredient list.  Choose fish and skinless chicken or [place] more often than red meat. Limit fish, poultry, and meat to 6 oz (170 g) each day.  Limit sweets, desserts, sugars, and sugary drinks.  Choose heart-healthy fats.  Limit cheese to 1 oz (28 g) per day.  Eat more home-cooked food and less restaurant, buffet, and fast food.  Limit fried foods.  Cook foods using methods  other than frying.  Limit canned vegetables. If you do use them, rinse them well to decrease the sodium.  When eating at a restaurant, ask that your food be prepared with less salt, or no salt if possible. WHAT FOODS CAN I EAT? Seek help from a dietitian for individual calorie needs. Grains Whole grain or whole wheat bread. Brown rice. Whole grain or whole wheat pasta. Quinoa, bulgur, and whole grain cereals. Low-sodium cereals. Corn or whole wheat flour tortillas. Whole grain cornbread. Whole grain crackers. Low-sodium crackers. Vegetables Fresh or frozen vegetables (raw, steamed, roasted, or grilled). Low-sodium or reduced-sodium tomato and vegetable juices. Low-sodium or reduced-sodium tomato sauce and paste. Low-sodium or reduced-sodium canned vegetables.  Fruits All fresh, canned (in natural juice), or frozen fruits. Meat and Other Protein Products Ground beef (85% or leaner), grass-fed beef, or beef trimmed of fat. Skinless chicken or [place]. Ground chicken or [place]. Pork trimmed of fat. All fish and seafood. Eggs. Dried beans, peas, or lentils. Unsalted nuts and seeds. Unsalted canned beans. Dairy Low-fat dairy products, such as skim or 1% milk, 2% or reduced-fat cheeses, low-fat ricotta or cottage cheese, or plain low-fat yogurt. Low-sodium or reduced-sodium cheeses. Fats and Oils Tub margarines without trans fats. Light or reduced-fat mayonnaise and salad dressings (reduced sodium). Avocado. Safflower, olive, or canola oils. Natural peanut or almond butter. Other Unsalted popcorn and pretzels. The items listed above may not be  a complete list of recommended foods or beverages. Contact your dietitian for more options. WHAT FOODS ARE NOT RECOMMENDED? Grains White bread. White pasta. White rice. Refined cornbread. Bagels and croissants. Crackers that contain trans fat. Vegetables Creamed or fried vegetables. Vegetables in a cheese sauce. Regular canned vegetables. Regular canned  tomato sauce and paste. Regular tomato and vegetable juices. Fruits Dried fruits. Canned fruit in light or heavy syrup. Fruit juice. Meat and Other Protein Products Fatty cuts of meat. Ribs, chicken wings, bacon, sausage, bologna, salami, chitterlings, fatback, hot dogs, bratwurst, and packaged luncheon meats. Salted nuts and seeds. Canned beans with salt. Dairy Whole or 2% milk, cream, half-and-half, and cream cheese. Whole-fat or sweetened yogurt. Full-fat cheeses or blue cheese. Nondairy creamers and whipped toppings. Processed cheese, cheese spreads, or cheese curds. Condiments Onion and garlic salt, seasoned salt, table salt, and sea salt. Canned and packaged gravies. Worcestershire sauce. Tartar sauce. Barbecue sauce. Teriyaki sauce. Soy sauce, including reduced sodium. Steak sauce. Fish sauce. Oyster sauce. Cocktail sauce. Horseradish. Ketchup and mustard. Meat flavorings and tenderizers. Bouillon cubes. Hot sauce. Tabasco sauce. Marinades. Taco seasonings. Relishes. Fats and Oils Butter, stick margarine, lard, shortening, ghee, and bacon fat. Coconut, palm kernel, or palm oils. Regular salad dressings. Other Pickles and olives. Salted popcorn and pretzels. The items listed above may not be a complete list of foods and beverages to avoid. Contact your dietitian for more information. WHERE CAN I FIND MORE INFORMATION? National Heart, Lung, and Blood Institute: travelstabloid.com Document Released: 05/05/2011 Document Revised: 09/30/2013 Document Reviewed: 03/20/2013 [facility] Patient Information 2015 [place], [place]. This information is not intended to replace advice given to you by your health care provider. Make sure you discuss any questions you have with your health care provider.

## 2018-05-15 LAB — BASIC METABOLIC PANEL WITH GFR
BUN: 16 mg/dL (ref 7–25)
CO2: 28 mmol/L (ref 20–32)
Calcium: 11.2 mg/dL — ABNORMAL HIGH (ref 8.6–10.3)
Chloride: 99 mmol/L (ref 98–110)
Creat: 1.09 mg/dL (ref 0.70–1.33)
GFR, Est African American: 87 mL/min/{1.73_m2} (ref >=60)
GFR, Est Non African American: 75 mL/min/{1.73_m2} (ref >=60)
Glucose, Bld: 254 mg/dL — ABNORMAL HIGH (ref 65–99)
Potassium: 5.2 mmol/L (ref 3.5–5.3)
Sodium: 139 mmol/L (ref 135–146)

## 2018-06-03 ENCOUNTER — Encounter: Payer: Self-pay | Admitting: Internal Medicine

## 2018-06-03 NOTE — Patient Instructions (Signed)

## 2018-06-03 NOTE — Progress Notes (Signed)
[facility]   [name], M.D.     Uvaldo Bristle. Silverio Lay, P.A.-C [name], [place]                [address] [place], [place] [address] Telephone [phone] Telefax [phone] Annual  Screening/Preventative Visit  & Comprehensive Evaluation & Examination     This very nice 59 y.o. MWM presents for a Screening /Preventative Visit & comprehensive evaluation and management of multiple medical co-morbidities.  Patient has been followed for HTN, HLD, T2_NIDDM and Vitamin D Deficiency. Patient has hx/o Gout & self d/c'd his meds.     HTN predates circa 2001 And he sporadically is med compliant. Patient's BP has not  been controlled at home usually running in the 150's/80-90's.  Today's BP is at goal - 134/84. Patient denies any cardiac symptoms as chest pain, palpitations, shortness of breath, dizziness or ankle swelling.     Patient's hyperlipidemia is not controlled with diet and sporadic compliance w/medications. Patient denies myalgias or other medication SE's. Last lipids were not at goal: Lab Results  Component Value Date   CHOL 198 03/29/2018   HDL 42 03/29/2018   LDLCALC 122 (H) 03/29/2018   TRIG 223 (H) 03/29/2018   CHOLHDL 4.7 03/29/2018      Patient has Morbid Obesity (BMI 38+) hx/o T2_NIDDM (A1c 7.1% / [id number])  and patient denies reactive hypoglycemic symptoms, visual blurring, diabetic polys or paresthesias. Last A1c was not at goal:   Lab Results  Component Value Date   HGBA1C 7.4 (H) 03/29/2018       Patient has hx/o Testosterone Deficiency (level "208" in 2013) and is sporadic in compliance w/ parenteral injections.      Finally, patient has history of Vitamin D Deficiency ("20" / 2008) and is not compliant with recommended supplementation as evidenced with his last vitamin D was still very low:  Lab Results  Component Value Date   VD25OH 34 03/29/2018   Current  Outpatient Medications on File Prior to Visit  Medication Sig  . allopurinol (ZYLOPRIM) 300 MG tablet TAKE 1/2 TO 1 TABLET DAILY TO PREVENT GOUT  . atorvastatin (LIPITOR) 10 MG tablet Take 1 tablet (10 mg total) by mouth daily.  . Cholecalciferol (VITAMIN D PO) Take 10,000 Units by mouth daily.  Marland Kitchen losartan-hydrochlorothiazide (HYZAAR) 100-12.5 MG tablet Take 1 tablet by mouth daily.  . meloxicam (MOBIC) 7.5 MG tablet TAKE 1 TABLET (7.5 MG TOTAL) BY MOUTH AS NEEDED  . aspirin 81 MG tablet Take 81 mg by mouth daily. Reported on 09/23/2015  . OVER THE COUNTER MEDICATION Takes a MVI.   No current facility-administered medications on file prior to visit.    Allergies  Allergen Reactions  . Codeine Nausea And Vomiting  . Nsaids Other (See Comments)    Bleeding ulcer 2013   Past Medical History:  Diagnosis Date  . Allergy    seasonal  . Blood transfusion 09/2011   2 units  . Hyperlipidemia   . Hypertension   . Renal disorder   . Stomach ulcer    Health Maintenance  Topic Date Due  . OPHTHALMOLOGY EXAM  03/17/1971  . COLONOSCOPY  12/06/2014  . INFLUENZA VACCINE  12/29/2018 (Originally 12/28/2017)  . HEMOGLOBIN A1C  09/27/2018  . FOOT EXAM  06/04/2019  . TETANUS/TDAP  10/09/2021  . PNEUMOCOCCAL POLYSACCHARIDE VACCINE AGE 80-64 HIGH RISK  Completed  . Hepatitis C Screening  Completed  . HIV Screening  Completed   Immunization History  Administered Date(s) Administered  . PPD Test 01/02/2014, 03/11/2015, 03/31/2016, 05/03/2017, 06/04/2018  . Pneumococcal Polysaccharide-23 10/04/2010, 05/03/2017  . Tdap 10/10/2011   Last Colon - 12/06/2011 - Dr Henrene Pastor - Recc f/u Colon in 3 yrs - overdue July 2016 - patient aware  Past Surgical History:  Procedure Laterality Date  . ESOPHAGOGASTRODUODENOSCOPY  10/26/2011   Procedure: ESOPHAGOGASTRODUODENOSCOPY (EGD);  Surgeon: Irene Shipper, MD;  Location: [facility];  Service: Endoscopy;  Laterality: N/A;  . FRACTURE SURGERY  broken collar bone    Family History  Adopted: Yes  Problem Relation Age of Onset  . Breast cancer Mother    Social History   Socioeconomic History  . Marital status: Married    Spouse name: Not on file  . Number of children: Not on file  Occupational History  . Sales  Tobacco Use  . Smoking status: Never Smoker  . Smokeless tobacco: Never Used  Substance and Sexual Activity  . Alcohol use: Yes    Alcohol/week: 1.0 standard drinks    Types: 1 Cans of beer per week    Comment: occasionally   . Drug use: No  . Sexual activity: Not on file    ROS Constitutional: Denies fever, chills, weight loss/gain, headaches, insomnia,  night sweats or change in appetite. Does c/o fatigue. Eyes: Denies redness, blurred vision, diplopia, discharge, itchy or watery eyes.  ENT: Denies discharge, congestion, post nasal drip, epistaxis, sore throat, earache, hearing loss, dental pain, Tinnitus, Vertigo, Sinus pain or snoring.  Cardio: Denies chest pain, palpitations, irregular heartbeat, syncope, dyspnea, diaphoresis, orthopnea, PND, claudication or edema Respiratory: denies cough, dyspnea, DOE, pleurisy, hoarseness, laryngitis or wheezing.  Gastrointestinal: Denies dysphagia, heartburn, reflux, water brash, pain, cramps, nausea, vomiting, bloating, diarrhea, constipation, hematemesis, melena, hematochezia, jaundice or hemorrhoids Genitourinary: Denies dysuria, frequency, urgency, nocturia, hesitancy, discharge, hematuria or flank pain Musculoskeletal: Denies arthralgia, myalgia, stiffness, Jt. Swelling, pain, limp or strain/sprain. Denies Falls. Skin: Denies puritis, rash, hives, warts, acne, eczema or change in skin lesion Neuro: No weakness, tremor, incoordination, spasms, paresthesia or pain Psychiatric: Denies confusion, memory loss or sensory loss. Denies Depression. Endocrine: Denies change in weight, skin, hair change, nocturia, and paresthesia, diabetic polys, visual blurring or hyper / hypo glycemic episodes.   Heme/Lymph: No excessive bleeding, bruising or enlarged lymph nodes.  Physical Exam  BP 134/84   Pulse 64   Temp 97.9 F (36.6 C)   Resp 18   Ht 5' 11.75" (1.822 m)   Wt 275 lb 6.4 oz (124.9 kg)   BMI 37.61 kg/m   General Appearance: Well nourished and well groomed and in no apparent distress.  Eyes: PERRLA, EOMs, conjunctiva no swelling or erythema, normal fundi and vessels. Sinuses: No frontal/maxillary tenderness ENT/Mouth: EACs patent / TMs  nl. Nares clear without erythema, swelling, mucoid exudates. Oral hygiene is good. No erythema, swelling, or exudate. Tongue normal, non-obstructing. Tonsils not swollen or erythematous. Hearing normal.  Neck: Supple, thyroid not palpable. No bruits, nodes or JVD. Respiratory: Respiratory effort normal.  BS equal and clear bilateral without rales, rhonci, wheezing or stridor. Cardio: Heart sounds are normal with regular rate and rhythm and no murmurs, rubs or gallops. Peripheral pulses are normal and equal bilaterally without edema. No aortic or femoral bruits. Chest: symmetric with normal excursions and percussion.  Abdomen: Soft, with Nl bowel sounds. Nontender, no guarding, rebound, hernias, masses, or organomegaly.  Lymphatics: Non  tender without lymphadenopathy.  Musculoskeletal: Full ROM all peripheral extremities, joint stability, 5/5 strength, and normal gait. Skin: Warm and dry without rashes, lesions, cyanosis, clubbing or  ecchymosis.  Neuro: Cranial nerves intact, reflexes equal bilaterally. Normal muscle tone, no cerebellar symptoms. Sensation intact.  Pysch: Alert and oriented X 3 with normal affect, insight and judgment appropriate.   Assessment and Plan  1. Annual Preventative/Screening Exam   1. Encounter for general adult medical examination with abnormal findings  2. Essential hypertension  - EKG 12-Lead - [place], RETROPERITNL ABD,  LTD - Urinalysis, Routine w reflex microscopic - Microalbumin / creatinine urine  ratio - CBC with Differential/Platelet - COMPLETE METABOLIC PANEL WITH GFR - Magnesium - Add - bisoprolol (ZEBETA) 10 MG tablet; Take 1 tablet at night for BP  Dispense: 90 tablet; Refill: 1  3. Hyperlipidemia, mixed  - EKG 12-Lead - [place], RETROPERITNL ABD,  LTD - Lipid panel - TSH  4. Type 2 diabetes mellitus with stage 2 chronic kidney disease, without long-term current use of insulin (HCC)  - EKG 12-Lead - [place], RETROPERITNL ABD,  LTD - Urinalysis, Routine w reflex microscopic - Microalbumin / creatinine urine ratio - HM DIABETES FOOT EXAM - LOW EXTREMITY NEUR EXAM DOCUM - Hemoglobin A1c - Insulin, random  5. Vitamin D deficiency  - VITAMIN D 25 Hydroxyl  6. OSA on CPAP   7. Idiopathic gout  - Uric acid  8. Testosterone deficiency  - Testosterone  9. Class 2 severe obesity due to excess calories with serious comorbidity and body mass index (BMI) of 38.0 to 38.9 in adult [facility])  - Long discussion re: dietary changes  - phentermine (ADIPEX-P) 37.5 MG tablet; Take 1/2 to 1 tablet every morning for Dieting & Weight  Loss  Dispense: 30 tablet; Refill: 5  - topiramate (TOPAMAX) 50 MG tablet; Take 1 tablet 2 x /day at Suppertime & Bedtime for Dieting & Weight Loss  Dispense: 180 tablet; Refill: 1  10. Gastroesophageal reflux disease  - CBC with Differential/Platelet  11. Screening for colorectal cancer  - POC Hemoccult Bld/Stl  12. Prostate cancer screening  - PSA  13. Screening for ischemic heart disease  - EKG 12-Lead  14. Screening for AAA (aortic abdominal aneurysm)  - [place], RETROPERITNL ABD,  LTD  15. Screening examination for pulmonary tuberculosis  - TB Skin Test  16. Fatigue, unspecified type  - Iron,Total/Total Iron Binding Cap - Vitamin B12  17. Medication management  - Urinalysis, Routine w reflex microscopic - Microalbumin / creatinine urine ratio - Testosterone - Uric acid - CBC with Differential/Platelet - COMPLETE METABOLIC  PANEL WITH GFR - Magnesium - Lipid panel - TSH - Hemoglobin A1c - Insulin, random - VITAMIN D 25 Hydroxyl        Patient was counseled in prudent diet, weight control to achieve/maintain BMI less than 25, BP monitoring, regular exercise and medications as discussed.  Discussed med effects and SE's. Routine screening labs and tests as requested with regular follow-up as recommended. Over 40 minutes of exam, counseling, chart review and high complex critical decision making was performed

## 2018-06-04 ENCOUNTER — Other Ambulatory Visit: Payer: Self-pay | Admitting: *Deleted

## 2018-06-04 ENCOUNTER — Encounter: Payer: Self-pay | Admitting: Internal Medicine

## 2018-06-04 ENCOUNTER — Ambulatory Visit: Payer: Commercial Managed Care - PPO | Admitting: Internal Medicine

## 2018-06-04 VITALS — BP 134/84 | HR 64 | Temp 97.9°F | Resp 18 | Ht 71.75 in | Wt 275.4 lb

## 2018-06-04 DIAGNOSIS — E349 Endocrine disorder, unspecified: Secondary | ICD-10-CM

## 2018-06-04 DIAGNOSIS — Z111 Encounter for screening for respiratory tuberculosis: Secondary | ICD-10-CM

## 2018-06-04 DIAGNOSIS — Z136 Encounter for screening for cardiovascular disorders: Secondary | ICD-10-CM

## 2018-06-04 DIAGNOSIS — R5383 Other fatigue: Secondary | ICD-10-CM

## 2018-06-04 DIAGNOSIS — I10 Essential (primary) hypertension: Secondary | ICD-10-CM

## 2018-06-04 DIAGNOSIS — N182 Chronic kidney disease, stage 2 (mild): Secondary | ICD-10-CM

## 2018-06-04 DIAGNOSIS — E782 Mixed hyperlipidemia: Secondary | ICD-10-CM

## 2018-06-04 DIAGNOSIS — K219 Gastro-esophageal reflux disease without esophagitis: Secondary | ICD-10-CM

## 2018-06-04 DIAGNOSIS — E1122 Type 2 diabetes mellitus with diabetic chronic kidney disease: Secondary | ICD-10-CM

## 2018-06-04 DIAGNOSIS — Z0000 Encounter for general adult medical examination without abnormal findings: Secondary | ICD-10-CM | POA: Diagnosis not present

## 2018-06-04 DIAGNOSIS — M1000 Idiopathic gout, unspecified site: Secondary | ICD-10-CM

## 2018-06-04 DIAGNOSIS — Z0001 Encounter for general adult medical examination with abnormal findings: Secondary | ICD-10-CM

## 2018-06-04 DIAGNOSIS — Z6838 Body mass index (BMI) 38.0-38.9, adult: Secondary | ICD-10-CM

## 2018-06-04 DIAGNOSIS — E559 Vitamin D deficiency, unspecified: Secondary | ICD-10-CM

## 2018-06-04 DIAGNOSIS — Z1212 Encounter for screening for malignant neoplasm of rectum: Secondary | ICD-10-CM

## 2018-06-04 DIAGNOSIS — Z9989 Dependence on other enabling machines and devices: Secondary | ICD-10-CM

## 2018-06-04 DIAGNOSIS — Z125 Encounter for screening for malignant neoplasm of prostate: Secondary | ICD-10-CM

## 2018-06-04 DIAGNOSIS — Z1211 Encounter for screening for malignant neoplasm of colon: Secondary | ICD-10-CM

## 2018-06-04 DIAGNOSIS — Z79899 Other long term (current) drug therapy: Secondary | ICD-10-CM

## 2018-06-04 DIAGNOSIS — R7303 Prediabetes: Secondary | ICD-10-CM

## 2018-06-04 DIAGNOSIS — G4733 Obstructive sleep apnea (adult) (pediatric): Secondary | ICD-10-CM

## 2018-06-04 MED ORDER — BISOPROLOL FUMARATE 10 MG PO TABS: ORAL_TABLET | ORAL | 1 refills | Status: DC

## 2018-06-04 MED ORDER — METFORMIN HCL ER 500 MG PO TB24: ORAL_TABLET | ORAL | 0 refills | Status: DC

## 2018-06-04 MED ORDER — PHENTERMINE HCL 37.5 MG PO TABS: ORAL_TABLET | ORAL | 5 refills | Status: DC

## 2018-06-04 MED ORDER — TOPIRAMATE 50 MG PO TABS: ORAL_TABLET | ORAL | 1 refills | Status: DC

## 2018-06-05 LAB — CBC WITH DIFFERENTIAL/PLATELET
Absolute Monocytes: 503 cells/uL (ref 200–950)
Basophils Absolute: 60 cells/uL (ref 0–200)
Basophils Relative: 0.9 %
Eosinophils Absolute: 268 cells/uL (ref 15–500)
Eosinophils Relative: 4 %
HCT: 46 % (ref 38.5–50.0)
Hemoglobin: 15.9 g/dL (ref 13.2–17.1)
Lymphs Abs: 2385 cells/uL (ref 850–3900)
MCH: 31.3 pg (ref 27.0–33.0)
MCHC: 34.6 g/dL (ref 32.0–36.0)
MCV: 90.6 fL (ref 80.0–100.0)
MPV: 10 fL (ref 7.5–12.5)
Monocytes Relative: 7.5 %
Neutro Abs: 3484 cells/uL (ref 1500–7800)
Neutrophils Relative %: 52 %
Platelets: 244 10*3/uL (ref 140–400)
RBC: 5.08 10*6/uL (ref 4.20–5.80)
RDW: 11.7 % (ref 11.0–15.0)
Total Lymphocyte: 35.6 %
WBC: 6.7 10*3/uL (ref 3.8–10.8)

## 2018-06-05 LAB — LIPID PANEL
Cholesterol: 189 mg/dL (ref <200)
HDL: 42 mg/dL (ref >40)
LDL Cholesterol (Calc): 115 mg/dL (calc) — ABNORMAL HIGH
Non-HDL Cholesterol (Calc): 147 mg/dL (calc) — ABNORMAL HIGH (ref <130)
Total CHOL/HDL Ratio: 4.5 (calc) (ref <5.0)
Triglycerides: 199 mg/dL — ABNORMAL HIGH (ref <150)

## 2018-06-05 LAB — COMPLETE METABOLIC PANEL WITH GFR
AG Ratio: 1.6 (calc) (ref 1.0–2.5)
ALT: 47 U/L — ABNORMAL HIGH (ref 9–46)
AST: 30 U/L (ref 10–35)
Albumin: 4.4 g/dL (ref 3.6–5.1)
Alkaline phosphatase (APISO): 55 U/L (ref 40–115)
BUN: 14 mg/dL (ref 7–25)
CO2: 28 mmol/L (ref 20–32)
Calcium: 9.9 mg/dL (ref 8.6–10.3)
Chloride: 104 mmol/L (ref 98–110)
Creat: 0.96 mg/dL (ref 0.70–1.33)
GFR, Est African American: 101 mL/min/{1.73_m2} (ref >=60)
GFR, Est Non African American: 87 mL/min/{1.73_m2} (ref >=60)
Globulin: 2.8 g/dL (calc) (ref 1.9–3.7)
Glucose, Bld: 199 mg/dL — ABNORMAL HIGH (ref 65–99)
Potassium: 4.9 mmol/L (ref 3.5–5.3)
Sodium: 141 mmol/L (ref 135–146)
Total Bilirubin: 0.9 mg/dL (ref 0.2–1.2)
Total Protein: 7.2 g/dL (ref 6.1–8.1)

## 2018-06-05 LAB — INSULIN, RANDOM: Insulin: 40.9 u[IU]/mL — ABNORMAL HIGH (ref 2.0–19.6)

## 2018-06-05 LAB — URINALYSIS, ROUTINE W REFLEX MICROSCOPIC
Bilirubin Urine: NEGATIVE
Hgb urine dipstick: NEGATIVE
Ketones, ur: NEGATIVE
Leukocytes, UA: NEGATIVE
Nitrite: NEGATIVE
Protein, ur: NEGATIVE
Specific Gravity, Urine: 1.019 (ref 1.001–1.03)
pH: <=5 (ref 5.0–8.0)

## 2018-06-05 LAB — MICROALBUMIN / CREATININE URINE RATIO
Creatinine, Urine: 110 mg/dL (ref 20–320)
Microalb Creat Ratio: 10 mcg/mg creat (ref <30)
Microalb, Ur: 1.1 mg/dL

## 2018-06-05 LAB — PSA: PSA: 0.3 ng/mL (ref <=4.0)

## 2018-06-05 LAB — HEMOGLOBIN A1C
Hgb A1c MFr Bld: 8.2 % of total Hgb — ABNORMAL HIGH (ref <5.7)
Mean Plasma Glucose: 189 (calc)
eAG (mmol/L): 10.4 (calc)

## 2018-06-05 LAB — IRON, TOTAL/TOTAL IRON BINDING CAP
%SAT: 53 % (calc) — ABNORMAL HIGH (ref 20–48)
Iron: 168 ug/dL (ref 50–180)
TIBC: 318 mcg/dL (calc) (ref 250–425)

## 2018-06-05 LAB — MAGNESIUM: Magnesium: 2 mg/dL (ref 1.5–2.5)

## 2018-06-05 LAB — URIC ACID: Uric Acid, Serum: 7.3 mg/dL (ref 4.0–8.0)

## 2018-06-05 LAB — TSH: TSH: 1.57 mIU/L (ref 0.40–4.50)

## 2018-06-05 LAB — VITAMIN D 25 HYDROXY (VIT D DEFICIENCY, FRACTURES): Vit D, 25-Hydroxy: 32 ng/mL (ref 30–100)

## 2018-06-05 LAB — TESTOSTERONE: Testosterone: 291 ng/dL (ref 250–827)

## 2018-06-05 LAB — VITAMIN B12: Vitamin B-12: 460 pg/mL (ref 200–1100)

## 2018-07-09 ENCOUNTER — Encounter: Payer: Self-pay | Admitting: Internal Medicine

## 2018-07-16 ENCOUNTER — Encounter: Payer: Self-pay | Admitting: Internal Medicine

## 2018-07-23 ENCOUNTER — Ambulatory Visit (INDEPENDENT_AMBULATORY_CARE_PROVIDER_SITE_OTHER): Payer: Commercial Managed Care - PPO | Admitting: Internal Medicine

## 2018-07-23 ENCOUNTER — Encounter: Payer: Self-pay | Admitting: Internal Medicine

## 2018-07-23 VITALS — BP 154/96 | HR 68 | Temp 97.2°F | Resp 16 | Ht 71.75 in | Wt 264.0 lb

## 2018-07-23 DIAGNOSIS — I10 Essential (primary) hypertension: Secondary | ICD-10-CM | POA: Diagnosis not present

## 2018-07-23 DIAGNOSIS — L918 Other hypertrophic disorders of the skin: Secondary | ICD-10-CM | POA: Diagnosis not present

## 2018-07-23 NOTE — Progress Notes (Signed)
  Subjective:    Patient ID: Paul Warner, male    DOB: [birth year], 58 y.o.   MRN: [id number]  HPI   Patient is a very nice 58 yo MWM followed for HTN, HLD, T2_NIDDM, Gout and Vitamin D Deficiency. Patient denies any c/o of HA's, dizziness, CP, palpitations, dyspnea or edema. He also has concerns re : multiple large skin tags of both axilla, both sides of neck and upper anterior chest clavicular areas. These skin tags have been annoying to him especially when he's hot & sweaty.   Medication Sig  . allopurinol (ZYLOPRIM) 300 MG tablet TAKE 1/2 TO 1 TABLET DAILY TO PREVENT GOUT  . aspirin 81 MG tablet Take 81 mg by mouth daily. Reported on 09/23/2015  . atorvastatin (LIPITOR) 10 MG tablet Take 1 tablet (10 mg total) by mouth daily.  . bisoprolol (ZEBETA) 10 MG tablet Take 1 tablet at night for BP  . Cholecalciferol (VITAMIN D PO) Take 10,000 Units by mouth daily.  Marland Kitchen losartan-hydrochlorothiazide (HYZAAR) 100-12.5 MG tablet Take 1 tablet by mouth daily.  . meloxicam (MOBIC) 7.5 MG tablet TAKE 1 TABLET (7.5 MG TOTAL) BY MOUTH AS NEEDED  . metFORMIN (GLUCOPHAGE-XR) 500 MG 24 hr tablet TAKE 1 TO 2 TABLETS TWICE /DAILY AS DIRECTED FOR DIABETES  . OVER THE COUNTER MEDICATION Takes a MVI.  Marland Kitchen phentermine (ADIPEX-P) 37.5 MG tablet Take 1/2 to 1 tablet every morning for Dieting & Weight  Loss  . topiramate (TOPAMAX) 50 MG tablet Take 1 tablet 2 x /day at Suppertime & Bedtime for Dieting & Weight Loss (Patient not taking: Reported on 07/23/2018)   No facility-administered medications prior to visit.    Allergies  Allergen Reactions  . Codeine Nausea And Vomiting  . Nsaids Other (See Comments)    Bleeding ulcer 2013   Past Medical History:  Diagnosis Date  . Allergy    seasonal  . Blood transfusion 09/2011   2 units  . Hyperlipidemia   . Hypertension   . Renal disorder   . Stomach ulcer    Review of Systems    10 point systems review negative except as above.    Objective:   Physical  Exam  BP (!) 154/96   Pulse 68   Temp (!) 97.2 F (36.2 C)   Resp 16   Ht 5' 11.75" (1.822 m)   Wt 264 lb (119.7 kg)   BMI 36.05 kg/m   HEENT - WNL. Neck - supple.  Chest - Clear equal BS. Cor - Nl HS. RRR w/o sig MGR. PP 1(+). No edema. MS- FROM w/o deformities.  Gait Nl. Neuro -  Nl w/o focal abnormalities. Skin - Multiple skin tags of sizes varying from 3-4 mm up to 8-10 mm located predominantly in the Right > Left axilla, bilat lateral neck areas and bilat clavicular areas.   Procedure (CPT: 80321 and  11201 x 3 ) After informed consent and aseptic alcohol prep and local anesthesia of #45 lesions the tags were sharply excised and wound bases individually hyfrecated for hemostasis.  Patient was instructed in wound care      Assessment & Plan:   1. Essential hypertension  2. Skin tags, multiple acquired

## 2018-09-03 NOTE — Progress Notes (Signed)
FOLLOW UP  Assessment and Plan:   Hypertension Elevated; off of meds x 2 weeks, emphasized need for adherence and risk of inconsistent treatment Monitor blood pressure at home; patient to call if consistently greater than 130/80 Continue DASH diet.   Reminder to go to the ER if any CP, SOB, nausea, dizziness, severe HA, changes vision/speech, left arm numbness and tingling and jaw pain.  Cholesterol Currently above goal; has been off of atorvastatin 10 mg daily - change to 40 mg tabs, discussed goal LDL <70 Continue low cholesterol diet and exercise.  Check lipid panel.   Diabetes with diabetic chronic kidney disease Continue medication: metformin, increase to max 2000 mg daily Sent in glucometer and supplies to start checking fasting glucose daily  Continue diet and exercise.  Perform daily foot/skin check, notify office of any concerning changes.  Check A1C  Morbid Obesity with co morbidities Long discussion about weight loss, diet, and exercise Recommended diet heavy in fruits and veggies and low in animal meats, cheeses, and dairy products, appropriate calorie intake Discussed ideal weight for height  Patient has done excellent work with cutting out soda, discussed improved breakfast and afternoon snack options Patient on phentermine with benefit and no SE, taking drug breaks; continue close follow up. Will follow up in 3 months  Vitamin D Def Continue supplementation, discussed goal of 70-100 Check vitamin D level  Gout Patient preference is currently off allopurinol, no recent flares since cutting out soda  Diet discussed Check uric acid as needed   Continue diet and meds as discussed. Further disposition pending results of labs. Discussed med's effects and SE's.   Over 30 minutes of exam, counseling, chart review, and critical decision making was performed.   Future Appointments  Date Time Provider [place]  12/14/2018  9:30 AM [name], MD  [facility] None  06/18/2019  9:00 AM [name], MD [facility] None    ----------------------------------------------------------------------------------------------------------------------  HPI 58 y.o. male  presents for 3 month follow up on hypertension, cholesterol, diabetes, morbid obesity, gout and vitamin D deficiency.   he is prescribed phentermine for weight loss.  While on the medication they have lost 12 lbs since last visit. They deny palpitations, anxiety, trouble sleeping, elevated BP.   BMI is Body mass index is 35.92 kg/m., he is working on diet and exercise. Has been walking and moving more. He has cut out soda, sweet tea, drinking water and unsweet tea, estimated 72 fluid ounces daily.  Wt Readings from Last 3 Encounters:  09/05/18 263 lb (119.3 kg)  07/23/18 264 lb (119.7 kg)  06/04/18 275 lb 6.4 oz (124.9 kg)   He has not been checking BP at home for the last few weeks, has been off of BP meds, today their BP is BP: (!) 152/90  He does workout. He denies chest pain, shortness of breath, dizziness.   He is on cholesterol medication (atorvastatin 10 mg daily) and denies myalgias. His cholesterol is not at goal. The cholesterol last visit was:   Lab Results  Component Value Date   CHOL 189 06/04/2018   HDL 42 06/04/2018   LDLCALC 115 (H) 06/04/2018   TRIG 199 (H) 06/04/2018   CHOLHDL 4.5 06/04/2018    He has not been working on diet and exercise for T2 diabetes on metformin 1000 mg daily, and denies foot ulcerations, hyperglycemia, increased appetite, nausea, paresthesia of the feet, polydipsia, polyuria, visual disturbances, vomiting and weight loss. He does not have meter to check sugars.  Last  A1C in the office was:  Lab Results  Component Value Date   HGBA1C 8.2 (H) 06/04/2018   Patient is on Vitamin D supplement but remained well below goal of 70 at last check:   Lab Results  Component Value Date   VD25OH 32 06/04/2018     Patient is prescribed  allopurinol for gout but doesn't take and does not report a recent flare.  Lab Results  Component Value Date   LABURIC 7.3 06/04/2018      Current Medications:  Current Outpatient Medications on File Prior to Visit  Medication Sig  . allopurinol (ZYLOPRIM) 300 MG tablet TAKE 1/2 TO 1 TABLET DAILY TO PREVENT GOUT  . aspirin 81 MG tablet Take 81 mg by mouth daily. Reported on 09/23/2015  . bisoprolol (ZEBETA) 10 MG tablet Take 1 tablet at night for BP  . Cholecalciferol (VITAMIN D PO) Take 10,000 Units by mouth daily.  Marland Kitchen losartan-hydrochlorothiazide (HYZAAR) 100-12.5 MG tablet Take 1 tablet by mouth daily.  . meloxicam (MOBIC) 7.5 MG tablet TAKE 1 TABLET (7.5 MG TOTAL) BY MOUTH AS NEEDED  . OVER THE COUNTER MEDICATION Takes a MVI.  Marland Kitchen phentermine (ADIPEX-P) 37.5 MG tablet Take 1/2 to 1 tablet every morning for Dieting & Weight  Loss   No current facility-administered medications on file prior to visit.      Allergies:  Allergies  Allergen Reactions  . Codeine Nausea And Vomiting  . Nsaids Other (See Comments)    Bleeding ulcer 2013     Medical History:  Past Medical History:  Diagnosis Date  . Allergy    seasonal  . Blood transfusion 09/2011   2 units  . Hyperlipidemia   . Hypertension   . Renal disorder   . Stomach ulcer    Family history- Reviewed and unchanged Social history- Reviewed and unchanged   Review of Systems:  Review of Systems  Constitutional: Negative for malaise/fatigue and weight loss.  HENT: Negative for hearing loss and tinnitus.   Eyes: Negative for blurred vision and double vision.  Respiratory: Negative for cough, shortness of breath and wheezing.   Cardiovascular: Negative for chest pain, palpitations, orthopnea, claudication and leg swelling.  Gastrointestinal: Negative for abdominal pain, blood in stool, constipation, diarrhea, heartburn, melena, nausea and vomiting.  Genitourinary: Negative.   Musculoskeletal: Negative for joint pain and  myalgias.  Skin: Negative for rash.  Neurological: Negative for dizziness, tingling, sensory change, weakness and headaches.  Endo/Heme/Allergies: Negative for polydipsia.  Psychiatric/Behavioral: Negative.   All other systems reviewed and are negative.    Physical Exam: BP (!) 152/90   Pulse (!) 105   Temp 97.9 F (36.6 C)   Ht 5' 11.75" (1.822 m)   Wt 263 lb (119.3 kg)   SpO2 97%   BMI 35.92 kg/m  Wt Readings from Last 3 Encounters:  09/05/18 263 lb (119.3 kg)  07/23/18 264 lb (119.7 kg)  06/04/18 275 lb 6.4 oz (124.9 kg)   General Appearance: Well nourished, in no apparent distress. Eyes: PERRLA, EOMs, conjunctiva no swelling or erythema Sinuses: No Frontal/maxillary tenderness ENT/Mouth: Ext aud canals clear, TMs without erythema, bulging. No erythema, swelling, or exudate on post pharynx.  Tonsils not swollen or erythematous. Hearing normal.  Neck: Supple, thyroid normal.  Respiratory: Respiratory effort normal, BS equal bilaterally without rales, rhonchi, wheezing or stridor.  Cardio: RRR with no MRGs. Brisk peripheral pulses without edema.  Abdomen: Soft, + BS.  Non tender, no guarding, rebound, hernias, masses. Lymphatics: Non tender without  lymphadenopathy.  Musculoskeletal: Full ROM, 5/5 strength, Normal gait Skin: Warm, dry without rashes, lesions, ecchymosis.  Neuro: Cranial nerves intact. No cerebellar symptoms.  Psych: Awake and oriented X 3, normal affect, Insight and Judgment appropriate.    Izora Ribas, NP 9:16 AM [facility]

## 2018-09-05 ENCOUNTER — Other Ambulatory Visit: Payer: Self-pay

## 2018-09-05 ENCOUNTER — Encounter: Payer: Self-pay | Admitting: Adult Health

## 2018-09-05 ENCOUNTER — Ambulatory Visit: Payer: Commercial Managed Care - PPO | Admitting: Adult Health

## 2018-09-05 VITALS — BP 152/90 | HR 105 | Temp 97.9°F | Ht 71.75 in | Wt 263.0 lb

## 2018-09-05 DIAGNOSIS — I10 Essential (primary) hypertension: Secondary | ICD-10-CM | POA: Diagnosis not present

## 2018-09-05 DIAGNOSIS — E119 Type 2 diabetes mellitus without complications: Secondary | ICD-10-CM | POA: Diagnosis not present

## 2018-09-05 DIAGNOSIS — E1122 Type 2 diabetes mellitus with diabetic chronic kidney disease: Secondary | ICD-10-CM | POA: Diagnosis not present

## 2018-09-05 DIAGNOSIS — R7303 Prediabetes: Secondary | ICD-10-CM

## 2018-09-05 DIAGNOSIS — Z79899 Other long term (current) drug therapy: Secondary | ICD-10-CM

## 2018-09-05 DIAGNOSIS — R945 Abnormal results of liver function studies: Secondary | ICD-10-CM

## 2018-09-05 DIAGNOSIS — E782 Mixed hyperlipidemia: Secondary | ICD-10-CM

## 2018-09-05 DIAGNOSIS — N182 Chronic kidney disease, stage 2 (mild): Secondary | ICD-10-CM

## 2018-09-05 DIAGNOSIS — M1000 Idiopathic gout, unspecified site: Secondary | ICD-10-CM

## 2018-09-05 DIAGNOSIS — R7989 Other specified abnormal findings of blood chemistry: Secondary | ICD-10-CM

## 2018-09-05 DIAGNOSIS — E785 Hyperlipidemia, unspecified: Secondary | ICD-10-CM

## 2018-09-05 DIAGNOSIS — E559 Vitamin D deficiency, unspecified: Secondary | ICD-10-CM

## 2018-09-05 DIAGNOSIS — E1169 Type 2 diabetes mellitus with other specified complication: Secondary | ICD-10-CM

## 2018-09-05 MED ORDER — FREESTYLE LANCETS MISC: 12 refills | Status: DC

## 2018-09-05 MED ORDER — FREESTYLE LITE DEVI: 1.0000 | Freq: Three times a day (TID) | 0 refills | Status: AC

## 2018-09-05 MED ORDER — ATORVASTATIN CALCIUM 40 MG PO TABS: 40.0000 mg | ORAL_TABLET | Freq: Every day | ORAL | 1 refills | Status: DC

## 2018-09-05 MED ORDER — GLUCOSE BLOOD VI STRP: ORAL_STRIP | 12 refills | Status: DC

## 2018-09-05 MED ORDER — METFORMIN HCL ER 500 MG PO TB24: ORAL_TABLET | ORAL | 0 refills | Status: DC

## 2018-09-05 NOTE — Telephone Encounter (Signed)
Insurance will cover Freestyle lite meter.

## 2018-09-05 NOTE — Patient Instructions (Addendum)
Goals    . Blood Pressure < 130/80    . Fasting Blood Glucose <130     Check fasting glucose daily and keep a log to bring to each appointment    . HEMOGLOBIN A1C < 7.0    . LDL CALC < 70    . Weight (lb) < 250 lb (113.4 kg)       Increase metformin to 4 tabs daily   Take fasting blood sugar daily and keep a log to bring to each appointment  Check blood pressure daily for a while and keep a log  Take blood pressure medications daily   Increase atorvastatin to 40 mg daily - 4 tabs of 10 mg until you run out then pick up new 40 mg tabs   Blood Glucose Monitoring, Adult Monitoring your blood sugar (glucose) is an important part of managing your diabetes (diabetes mellitus). Blood glucose monitoring involves checking your blood glucose as often as directed and keeping a record (log) of your results over time. Checking your blood glucose regularly and keeping a blood glucose log can:  Help you and your health care provider adjust your diabetes management plan as needed, including your medicines or insulin.  Help you understand how food, exercise, illnesses, and medicines affect your blood glucose.  Let you know what your blood glucose is at any time. You can quickly find out if you have low blood glucose (hypoglycemia) or high blood glucose (hyperglycemia). Your health care provider will set individualized treatment goals for you. Your goals will be based on your age, other medical conditions you have, and how you respond to diabetes treatment. Generally, the goal of treatment is to maintain the following blood glucose levels:  Before meals (preprandial): 80-130 mg/dL (4.4-7.2 mmol/L).  After meals (postprandial): below 180 mg/dL (10 mmol/L).  A1c level: less than 7%. Supplies needed:  Blood glucose meter.  Test strips for your meter. Each meter has its own strips. You must use the strips that came with your meter.  A needle to prick your finger (lancet). Do not use a lancet  more than one time.  A device that holds the lancet (lancing device).  A journal or log book to write down your results. How to check your blood glucose  1. Wash your hands with soap and water. 2. Prick the side of your finger (not the tip) with the lancet. Use a different finger each time. 3. Gently rub the finger until a small drop of blood appears. 4. Follow instructions that come with your meter for inserting the test strip, applying blood to the strip, and using your blood glucose meter. 5. Write down your result and any notes. Some meters allow you to use areas of your body other than your finger (alternative sites) to test your blood. The most common alternative sites are:  Forearm.  Thigh.  Palm of the hand. If you think you may have hypoglycemia, or if you have a history of not knowing when your blood glucose is getting low (hypoglycemia unawareness), do not use alternative sites. Use your finger instead. Alternative sites may not be as accurate as the fingers, because blood flow is slower in these areas. This means that the result you get may be delayed, and it may be different from the result that you would get from your finger. Follow these instructions at home: Blood glucose log   Every time you check your blood glucose, write down your result. Also write down any notes about  things that may be affecting your blood glucose, such as your diet and exercise for the day. This information can help you and your health care provider: ? Look for patterns in your blood glucose over time. ? Adjust your diabetes management plan as needed.  Check if your meter allows you to download your records to a computer. Most glucose meters store a record of glucose readings in the meter. If you have type 1 diabetes:  Check your blood glucose 2 or more times a day.  Also check your blood glucose: ? Before every insulin injection. ? Before and after exercise. ? Before meals. ? 2 hours after  a meal. ? Occasionally between 2:00 a.m. and 3:00 a.m., as directed. ? Before potentially dangerous tasks, like driving or using heavy machinery. ? At bedtime.  You may need to check your blood glucose more often, up to 6-10 times a day, if you: ? Use an insulin pump. ? Need multiple daily injections (MDI). ? Have diabetes that is not well-controlled. ? Are ill. ? Have a history of severe hypoglycemia. ? Have hypoglycemia unawareness. If you have type 2 diabetes:  If you take insulin or other diabetes medicines, check your blood glucose 2 or more times a day.  If you are on intensive insulin therapy, check your blood glucose 4 or more times a day. Occasionally, you may also need to check between 2:00 a.m. and 3:00 a.m., as directed.  Also check your blood glucose: ? Before and after exercise. ? Before potentially dangerous tasks, like driving or using heavy machinery.  You may need to check your blood glucose more often if: ? Your medicine is being adjusted. ? Your diabetes is not well-controlled. ? You are ill. General tips  Always keep your supplies with you.  If you have questions or need help, all blood glucose meters have a 24-hour "hotline" phone number that you can call. You may also contact your health care provider.  After you use a few boxes of test strips, adjust (calibrate) your blood glucose meter by following instructions that came with your meter. Contact a health care provider if:  Your blood glucose is at or above 240 mg/dL (13.3 mmol/L) for 2 days in a row.  You have been sick or have had a fever for 2 days or longer, and you are not getting better.  You have any of the following problems for more than 6 hours: ? You cannot eat or drink. ? You have nausea or vomiting. ? You have diarrhea. Get help right away if:  Your blood glucose is lower than 54 mg/dL (3 mmol/L).  You become confused or you have trouble thinking clearly.  You have difficulty  breathing.  You have moderate or large ketone levels in your urine. Summary  Monitoring your blood sugar (glucose) is an important part of managing your diabetes (diabetes mellitus).  Blood glucose monitoring involves checking your blood glucose as often as directed and keeping a record (log) of your results over time.  Your health care provider will set individualized treatment goals for you. Your goals will be based on your age, other medical conditions you have, and how you respond to diabetes treatment.  Every time you check your blood glucose, write down your result. Also write down any notes about things that may be affecting your blood glucose, such as your diet and exercise for the day. This information is not intended to replace advice given to you by your health care provider.  Make sure you discuss any questions you have with your health care provider. Document Released: 05/19/2003 Document Revised: 03/27/2017 Document Reviewed: 10/26/2015 Elsevier Interactive Patient Education  2019 [employer].

## 2018-09-06 LAB — CBC WITH DIFFERENTIAL/PLATELET
Absolute Monocytes: 827 cells/uL (ref 200–950)
Basophils Absolute: 62 cells/uL (ref 0–200)
Basophils Relative: 0.7 %
Eosinophils Absolute: 97 cells/uL (ref 15–500)
Eosinophils Relative: 1.1 %
HCT: 49.3 % (ref 38.5–50.0)
Hemoglobin: 17 g/dL (ref 13.2–17.1)
Lymphs Abs: 3573 cells/uL (ref 850–3900)
MCH: 31.9 pg (ref 27.0–33.0)
MCHC: 34.5 g/dL (ref 32.0–36.0)
MCV: 92.5 fL (ref 80.0–100.0)
MPV: 10 fL (ref 7.5–12.5)
Monocytes Relative: 9.4 %
Neutro Abs: 4242 cells/uL (ref 1500–7800)
Neutrophils Relative %: 48.2 %
Platelets: 244 10*3/uL (ref 140–400)
RBC: 5.33 10*6/uL (ref 4.20–5.80)
RDW: 11.8 % (ref 11.0–15.0)
Total Lymphocyte: 40.6 %
WBC: 8.8 10*3/uL (ref 3.8–10.8)

## 2018-09-06 LAB — HEMOGLOBIN A1C
Hgb A1c MFr Bld: 7 % of total Hgb — ABNORMAL HIGH (ref <5.7)
Mean Plasma Glucose: 154 (calc)
eAG (mmol/L): 8.5 (calc)

## 2018-09-06 LAB — COMPLETE METABOLIC PANEL WITH GFR
AG Ratio: 1.6 (calc) (ref 1.0–2.5)
ALT: 38 U/L (ref 9–46)
AST: 25 U/L (ref 10–35)
Albumin: 4.5 g/dL (ref 3.6–5.1)
Alkaline phosphatase (APISO): 55 U/L (ref 35–144)
BUN: 22 mg/dL (ref 7–25)
CO2: 28 mmol/L (ref 20–32)
Calcium: 9.8 mg/dL (ref 8.6–10.3)
Chloride: 99 mmol/L (ref 98–110)
Creat: 1.12 mg/dL (ref 0.70–1.33)
GFR, Est African American: 84 mL/min/{1.73_m2} (ref >=60)
GFR, Est Non African American: 73 mL/min/{1.73_m2} (ref >=60)
Globulin: 2.8 g/dL (calc) (ref 1.9–3.7)
Glucose, Bld: 244 mg/dL — ABNORMAL HIGH (ref 65–99)
Potassium: 4.4 mmol/L (ref 3.5–5.3)
Sodium: 138 mmol/L (ref 135–146)
Total Bilirubin: 0.9 mg/dL (ref 0.2–1.2)
Total Protein: 7.3 g/dL (ref 6.1–8.1)

## 2018-09-06 LAB — MAGNESIUM: Magnesium: 2 mg/dL (ref 1.5–2.5)

## 2018-09-06 LAB — LIPID PANEL
Cholesterol: 195 mg/dL (ref <200)
HDL: 54 mg/dL (ref >=40)
LDL Cholesterol (Calc): 104 mg/dL (calc) — ABNORMAL HIGH
Non-HDL Cholesterol (Calc): 141 mg/dL (calc) — ABNORMAL HIGH (ref <130)
Total CHOL/HDL Ratio: 3.6 (calc) (ref <5.0)
Triglycerides: 257 mg/dL — ABNORMAL HIGH (ref <150)

## 2018-09-06 LAB — TSH: TSH: 3.45 mIU/L (ref 0.40–4.50)

## 2018-09-06 LAB — VITAMIN D 25 HYDROXY (VIT D DEFICIENCY, FRACTURES): Vit D, 25-Hydroxy: 30 ng/mL (ref 30–100)

## 2018-12-13 NOTE — Progress Notes (Signed)
THIS ENCOUNTER IS A VIRTUAL VISIT DUE TO COVID-19 - PATIENT WAS NOT SEEN IN THE OFFICE.  PATIENT HAS CONSENTED TO VIRTUAL VISIT / TELEMEDICINE VISIT  This provider placed a call to Paul Warner using telephone, his appointment was changed to a virtual office visit to reduce the risk of exposure to the COVID-19 virus and to help Paul Warner remain healthy and safe. The virtual visit will also provide continuity of care. He verbalizes understanding.   Virtual Visit via telephone Note  I connected with  Paul Warner  on 12/13/18  by telephone.  I verified that I am speaking with the correct person using two identifiers.        I discussed the limitations of evaluation and management by telemedicine and the availability of in person appointments. The patient expressed understanding and agreed to proceed.  History of Present Illness:      This very nice 58 y.o. MWM presents for 6 month follow up with HTN, HLD, T2_NIDDM, Gout and Vitamin D Deficiency Patient has been off of his Allopurinol:       Patient is treated for HTN (2001) & BP has been controlled at home. Patient's BP has not  been controlled at home usually running in the 150's/80-90's.  Patient has had no complaints of any cardiac type chest pain, palpitations, dyspnea / orthopnea / PND, dizziness, claudication, or dependent edema.       Hyperlipidemia is not controlled with diet & meds. Patient denies myalgias or other med SE's. Last Lipids were not at goal: Lab Results  Component Value Date   CHOL 195 09/05/2018   HDL 54 09/05/2018   LDLCALC 104 (H) 09/05/2018   TRIG 257 (H) 09/05/2018   CHOLHDL 3.6 09/05/2018        Also, the patient has  Morbid Obesity (BMI 38+) and history of T2_NIDDM  (A1c 7.1% / [id number])  and has had no symptoms of reactive hypoglycemia, diabetic polys, paresthesias or visual blurring.  Last A1c was not at goal: Lab Results  Component Value Date   HGBA1C 7.0 (H) 09/05/2018        Patient  has hx/o Low Testosterone ("208" / 2013)  and has sporadically been on Testosterone replacement by injection.        Further, the patient also has history of Vitamin D Deficiency ("20" / 2008)  and inconsistently take his recommended vitamin D. Last vitamin D was still very low reflecting his poor compliance: Lab Results  Component Value Date   VD25OH 30 09/05/2018   Current Outpatient Medications on File Prior to Visit  Medication Sig  . aspirin 81 MG tablet Take 81 mg by mouth daily. Reported on 09/23/2015  . atorvastatin (LIPITOR) 40 MG tablet Take 1 tablet (40 mg total) by mouth daily at 6 PM.  . bisoprolol (ZEBETA) 10 MG tablet Take 1 tablet at night for BP  . Blood Glucose Monitoring Suppl (FREESTYLE LITE) DEVI 1 Device by Does not apply route 3 (three) times daily.  . Cholecalciferol (VITAMIN D PO) Take 10,000 Units by mouth daily.  Marland Kitchen glucose blood (FREESTYLE LITE) test strip Use as instructed  . Lancets (FREESTYLE) lancets Use as instructed  . losartan-hydrochlorothiazide (HYZAAR) 100-12.5 MG tablet Take 1 tablet by mouth daily.  . meloxicam (MOBIC) 7.5 MG tablet TAKE 1 TABLET (7.5 MG TOTAL) BY MOUTH AS NEEDED  . metFORMIN (GLUCOPHAGE-XR) 500 MG 24 hr tablet TAKE 2 TABLETS TWICE /DAILY AS DIRECTED FOR DIABETES  . OVER  THE COUNTER MEDICATION Takes a MVI.  Marland Kitchen phentermine (ADIPEX-P) 37.5 MG tablet Take 1/2 to 1 tablet every morning for Dieting & Weight  Loss   No current facility-administered medications on file prior to visit.    Allergies  Allergen Reactions  . Codeine Nausea And Vomiting  . Nsaids Other (See Comments)    Bleeding ulcer 2013   PMHx:   Past Medical History:  Diagnosis Date  . Allergy    seasonal  . Blood transfusion 09/2011   2 units  . Hyperlipidemia   . Hypertension   . Renal disorder   . Stomach ulcer    Immunization History  Administered Date(s) Administered  . PPD Test 01/02/2014, 03/11/2015, 03/31/2016, 05/03/2017, 06/04/2018  . Pneumococcal  Polysaccharide-23 10/04/2010, 05/03/2017  . Tdap 10/10/2011   Past Surgical History:  Procedure Laterality Date  . ESOPHAGOGASTRODUODENOSCOPY  10/26/2011   Procedure: ESOPHAGOGASTRODUODENOSCOPY (EGD);  Surgeon: Irene Shipper, MD;  Location: [facility] ENDOSCOPY;  Service: Endoscopy;  Laterality: N/A;  . FRACTURE SURGERY  broken collar bone   FHx:    Reviewed / unchanged  SHx:    Reviewed / unchanged   Systems Review:  Constitutional: Denies fever, chills, wt changes, headaches, insomnia, fatigue, night sweats, change in appetite. Eyes: Denies redness, blurred vision, diplopia, discharge, itchy, watery eyes.  ENT: Denies discharge, congestion, post nasal drip, epistaxis, sore throat, earache, hearing loss, dental pain, tinnitus, vertigo, sinus pain, snoring.  CV: Denies chest pain, palpitations, irregular heartbeat, syncope, dyspnea, diaphoresis, orthopnea, PND, claudication or edema. Respiratory: denies cough, dyspnea, DOE, pleurisy, hoarseness, laryngitis, wheezing.  Gastrointestinal: Denies dysphagia, odynophagia, heartburn, reflux, water brash, abdominal pain or cramps, nausea, vomiting, bloating, diarrhea, constipation, hematemesis, melena, hematochezia  or hemorrhoids. Genitourinary: Denies dysuria, frequency, urgency, nocturia, hesitancy, discharge, hematuria or flank pain. Musculoskeletal: Denies arthralgias, myalgias, stiffness, jt. swelling, pain, limping or strain/sprain.  Skin: Denies pruritus, rash, hives, warts, acne, eczema or change in skin lesion(s). Neuro: No weakness, tremor, incoordination, spasms, paresthesia or pain. Psychiatric: Denies confusion, memory loss or sensory loss. Endo: Denies change in weight, skin or hair change.  Heme/Lymph: No excessive bleeding, bruising or enlarged lymph nodes.  Physical Exam  General : Well sounding patient in no apparent distress HEENT: no hoarseness, no cough for duration of visit Lungs: speaks in complete sentences, no audible  wheezing, no apparent distress Neurological: alert, oriented x 3 Psychiatric: pleasant, judgement appropriate    Assessment and Plan:  1. Essential hypertension  - Continue medication, monitor blood pressure at home.  - Continue DASH diet.  Reminder to go to the ER if any CP,  SOB, nausea, dizziness, severe HA, changes vision/speech.  - CBC with Differential/Platelet; Future - COMPLETE METABOLIC PANEL WITH GFR; Future - Magnesium; Future - TSH; Future  2. Hyperlipidemia, mixed  - Continue diet/meds, exercise,& lifestyle modifications.  - Continue monitor periodic cholesterol/liver & renal functions   - Lipid panel; Future - TSH; Future  3. Type 2 diabetes mellitus with stage 2 chronic kidney disease, without long-term current use of insulin (HCC)  - Continue diet, exercise  - Lifestyle modifications.  - Monitor appropriate labs.  - Hemoglobin A1c; Future  4. Vitamin D deficiency  - Continue supplementation.   - VITAMIN D 25 Hydroxy (Vit-D Deficiency, Fractures); Future  5. Testosterone deficiency  - Testosterone; Future  6. OSA on CPAP   7. Idiopathic gout  - Uric acid; Future  8. Medication management  - CBC with Differential/Platelet; Future - COMPLETE METABOLIC PANEL WITH GFR; Future - Magnesium;  Future - Lipid panel; Future - TSH; Future - Hemoglobin A1c; Future - Insulin, random; Future - VITAMIN D 25 Hydroxy (Vit-D Deficiency, Fractures); Future - Uric acid; Future - Testosterone; Future  9. Class 2 severe obesity due to excess calories with serious comorbidity and body mass index (BMI) of 38.0 to 38.9 in adult (HCC)  - phentermine (ADIPEX-P) 37.5 MG tablet; Take 1/2 to 1 tablet every morning for Dieting & Weight  Loss  Dispense: 90 tablet; Refill: 1      Discussed  regular exercise, BP monitoring, weight control to achieve/maintain BMI less than 25 and discussed med and SE's. Recommended labs to assess and monitor clinical status with further  disposition pending results of labs. I discussed the assessment and treatment plan with the patient. The patient was provided an opportunity to ask questions and all were answered. The patient agreed with the plan and demonstrated an understanding of the instructions. I provided 23 minutes of non-face-to-face time during this encounter and over 28 minutes of exam, counseling, chart review and  complex critical decision making was performed   Kirtland Bouchard, MD

## 2018-12-14 ENCOUNTER — Ambulatory Visit: Payer: Commercial Managed Care - PPO | Admitting: Internal Medicine

## 2018-12-14 ENCOUNTER — Ambulatory Visit: Payer: Self-pay | Admitting: Internal Medicine

## 2018-12-14 ENCOUNTER — Encounter: Payer: Self-pay | Admitting: Internal Medicine

## 2018-12-14 DIAGNOSIS — E349 Endocrine disorder, unspecified: Secondary | ICD-10-CM

## 2018-12-14 DIAGNOSIS — E782 Mixed hyperlipidemia: Secondary | ICD-10-CM | POA: Diagnosis not present

## 2018-12-14 DIAGNOSIS — I10 Essential (primary) hypertension: Secondary | ICD-10-CM

## 2018-12-14 DIAGNOSIS — E1122 Type 2 diabetes mellitus with diabetic chronic kidney disease: Secondary | ICD-10-CM | POA: Diagnosis not present

## 2018-12-14 DIAGNOSIS — Z6838 Body mass index (BMI) 38.0-38.9, adult: Secondary | ICD-10-CM

## 2018-12-14 DIAGNOSIS — Z79899 Other long term (current) drug therapy: Secondary | ICD-10-CM

## 2018-12-14 DIAGNOSIS — M1000 Idiopathic gout, unspecified site: Secondary | ICD-10-CM

## 2018-12-14 DIAGNOSIS — E559 Vitamin D deficiency, unspecified: Secondary | ICD-10-CM | POA: Diagnosis not present

## 2018-12-14 DIAGNOSIS — G4733 Obstructive sleep apnea (adult) (pediatric): Secondary | ICD-10-CM

## 2018-12-14 DIAGNOSIS — Z9989 Dependence on other enabling machines and devices: Secondary | ICD-10-CM

## 2018-12-14 DIAGNOSIS — E66812 Obesity, class 2: Secondary | ICD-10-CM

## 2018-12-14 DIAGNOSIS — N182 Chronic kidney disease, stage 2 (mild): Secondary | ICD-10-CM

## 2018-12-14 MED ORDER — ZINC 50 MG PO TABS: ORAL_TABLET | ORAL | 0 refills | Status: DC

## 2018-12-14 MED ORDER — PHENTERMINE HCL 37.5 MG PO TABS: ORAL_TABLET | ORAL | 1 refills | Status: DC

## 2018-12-14 NOTE — Patient Instructions (Signed)

## 2018-12-17 ENCOUNTER — Other Ambulatory Visit: Payer: Self-pay

## 2018-12-25 ENCOUNTER — Other Ambulatory Visit: Payer: Commercial Managed Care - PPO

## 2018-12-25 ENCOUNTER — Other Ambulatory Visit: Payer: Self-pay

## 2018-12-25 DIAGNOSIS — E559 Vitamin D deficiency, unspecified: Secondary | ICD-10-CM

## 2018-12-25 DIAGNOSIS — M1000 Idiopathic gout, unspecified site: Secondary | ICD-10-CM

## 2018-12-25 DIAGNOSIS — E782 Mixed hyperlipidemia: Secondary | ICD-10-CM

## 2018-12-25 DIAGNOSIS — E1122 Type 2 diabetes mellitus with diabetic chronic kidney disease: Secondary | ICD-10-CM

## 2018-12-25 DIAGNOSIS — E349 Endocrine disorder, unspecified: Secondary | ICD-10-CM

## 2018-12-25 DIAGNOSIS — N182 Chronic kidney disease, stage 2 (mild): Secondary | ICD-10-CM

## 2018-12-25 DIAGNOSIS — I10 Essential (primary) hypertension: Secondary | ICD-10-CM

## 2018-12-25 DIAGNOSIS — Z79899 Other long term (current) drug therapy: Secondary | ICD-10-CM

## 2018-12-26 ENCOUNTER — Other Ambulatory Visit: Payer: Self-pay | Admitting: Internal Medicine

## 2018-12-26 DIAGNOSIS — M1000 Idiopathic gout, unspecified site: Secondary | ICD-10-CM

## 2018-12-26 LAB — LIPID PANEL
Cholesterol: 185 mg/dL (ref <200)
HDL: 40 mg/dL (ref >=40)
LDL Cholesterol (Calc): 119 mg/dL (calc) — ABNORMAL HIGH
Non-HDL Cholesterol (Calc): 145 mg/dL (calc) — ABNORMAL HIGH (ref <130)
Total CHOL/HDL Ratio: 4.6 (calc) (ref <5.0)
Triglycerides: 138 mg/dL (ref <150)

## 2018-12-26 LAB — CBC WITH DIFFERENTIAL/PLATELET
Absolute Monocytes: 484 cells/uL (ref 200–950)
Basophils Absolute: 43 cells/uL (ref 0–200)
Basophils Relative: 0.7 %
Eosinophils Absolute: 192 cells/uL (ref 15–500)
Eosinophils Relative: 3.1 %
HCT: 48 % (ref 38.5–50.0)
Hemoglobin: 16.3 g/dL (ref 13.2–17.1)
Lymphs Abs: 1934 cells/uL (ref 850–3900)
MCH: 31.7 pg (ref 27.0–33.0)
MCHC: 34 g/dL (ref 32.0–36.0)
MCV: 93.2 fL (ref 80.0–100.0)
MPV: 9.8 fL (ref 7.5–12.5)
Monocytes Relative: 7.8 %
Neutro Abs: 3546 cells/uL (ref 1500–7800)
Neutrophils Relative %: 57.2 %
Platelets: 222 10*3/uL (ref 140–400)
RBC: 5.15 10*6/uL (ref 4.20–5.80)
RDW: 11.7 % (ref 11.0–15.0)
Total Lymphocyte: 31.2 %
WBC: 6.2 10*3/uL (ref 3.8–10.8)

## 2018-12-26 LAB — COMPLETE METABOLIC PANEL WITH GFR
AG Ratio: 1.5 (calc) (ref 1.0–2.5)
ALT: 35 U/L (ref 9–46)
AST: 26 U/L (ref 10–35)
Albumin: 4.3 g/dL (ref 3.6–5.1)
Alkaline phosphatase (APISO): 48 U/L (ref 35–144)
BUN: 16 mg/dL (ref 7–25)
CO2: 26 mmol/L (ref 20–32)
Calcium: 9.6 mg/dL (ref 8.6–10.3)
Chloride: 101 mmol/L (ref 98–110)
Creat: 0.98 mg/dL (ref 0.70–1.33)
GFR, Est African American: 99 mL/min/{1.73_m2} (ref >=60)
GFR, Est Non African American: 85 mL/min/{1.73_m2} (ref >=60)
Globulin: 2.8 g/dL (calc) (ref 1.9–3.7)
Glucose, Bld: 212 mg/dL — ABNORMAL HIGH (ref 65–99)
Potassium: 4.4 mmol/L (ref 3.5–5.3)
Sodium: 139 mmol/L (ref 135–146)
Total Bilirubin: 1.2 mg/dL (ref 0.2–1.2)
Total Protein: 7.1 g/dL (ref 6.1–8.1)

## 2018-12-26 LAB — HEMOGLOBIN A1C
Hgb A1c MFr Bld: 7.3 % of total Hgb — ABNORMAL HIGH (ref <5.7)
Mean Plasma Glucose: 163 (calc)
eAG (mmol/L): 9 (calc)

## 2018-12-26 LAB — TSH: TSH: 2.26 mIU/L (ref 0.40–4.50)

## 2018-12-26 LAB — MAGNESIUM: Magnesium: 1.7 mg/dL (ref 1.5–2.5)

## 2018-12-26 LAB — TESTOSTERONE: Testosterone: 284 ng/dL (ref 250–827)

## 2018-12-26 LAB — INSULIN, RANDOM: Insulin: 40.9 u[IU]/mL — ABNORMAL HIGH

## 2018-12-26 LAB — URIC ACID: Uric Acid, Serum: 8.5 mg/dL — ABNORMAL HIGH (ref 4.0–8.0)

## 2018-12-26 LAB — VITAMIN D 25 HYDROXY (VIT D DEFICIENCY, FRACTURES): Vit D, 25-Hydroxy: 48 ng/mL (ref 30–100)

## 2018-12-26 MED ORDER — ALLOPURINOL 300 MG PO TABS: ORAL_TABLET | ORAL | 3 refills | Status: DC

## 2019-03-25 NOTE — Progress Notes (Deleted)
FOLLOW UP  Assessment and Plan:   Hypertension Elevated; off of meds x 2 weeks, emphasized need for adherence and risk of inconsistent treatment Monitor blood pressure at home; patient to call if consistently greater than 130/80 Continue DASH diet.   Reminder to go to the ER if any CP, SOB, nausea, dizziness, severe HA, changes vision/speech, left arm numbness and tingling and jaw pain.  Cholesterol Currently above goal; has been off of atorvastatin 10 mg daily - change to 40 mg tabs, discussed goal LDL <70 Continue low cholesterol diet and exercise.  Check lipid panel.   Diabetes with diabetic chronic kidney disease Continue medication: metformin, increase to max 2000 mg daily Sent in glucometer and supplies to start checking fasting glucose daily  Continue diet and exercise.  Perform daily foot/skin check, notify office of any concerning changes.  Check A1C  Morbid Obesity with co morbidities Long discussion about weight loss, diet, and exercise Recommended diet heavy in fruits and veggies and low in animal meats, cheeses, and dairy products, appropriate calorie intake Discussed ideal weight for height  Patient has done excellent work with cutting out soda, discussed improved breakfast and afternoon snack options Patient on phentermine with benefit and no SE, taking drug breaks; continue close follow up. Will follow up in 3 months  Vitamin D Def Continue supplementation, discussed goal of 70-100 Check vitamin D level  Gout Patient preference is currently off allopurinol, no recent flares since cutting out soda  Diet discussed Check uric acid as needed   Continue diet and meds as discussed. Further disposition pending results of labs. Discussed med's effects and SE's.   Over 30 minutes of exam, counseling, chart review, and critical decision making was performed.   Future Appointments  Date Time Provider [place]  03/26/2019  8:45 AM Vicie Mutters, PA-C  [facility] None  07/23/2019  9:00 AM [name], MD [facility] None    ----------------------------------------------------------------------------------------------------------------------  HPI 58 y.o. male  presents for 3 month follow up on hypertension, cholesterol, diabetes, morbid obesity, gout and vitamin D deficiency.   he is prescribed phentermine for weight loss.  While on the medication they have lost 12 lbs since last visit. They deny palpitations, anxiety, trouble sleeping, elevated BP.   BMI is There is no height or weight on file to calculate BMI., he is working on diet and exercise. Has been walking and moving more. He has cut out soda, sweet tea, drinking water and unsweet tea, estimated 72 fluid ounces daily.  Wt Readings from Last 3 Encounters:  09/05/18 263 lb (119.3 kg)  07/23/18 264 lb (119.7 kg)  06/04/18 275 lb 6.4 oz (124.9 kg)   He has not been checking BP at home for the last few weeks, has been off of BP meds, today their BP is    He does workout. He denies chest pain, shortness of breath, dizziness.   He is on cholesterol medication (atorvastatin 10 mg daily) and denies myalgias. His cholesterol is not at goal. The cholesterol last visit was:   Lab Results  Component Value Date   CHOL 185 12/25/2018   HDL 40 12/25/2018   LDLCALC 119 (H) 12/25/2018   TRIG 138 12/25/2018   CHOLHDL 4.6 12/25/2018    He has not been working on diet and exercise for T2 diabetes on metformin 1000 mg daily, and denies foot ulcerations, hyperglycemia, increased appetite, nausea, paresthesia of the feet, polydipsia, polyuria, visual disturbances, vomiting and weight loss. He does not have meter to check  sugars.  Last A1C in the office was:  Lab Results  Component Value Date   HGBA1C 7.3 (H) 12/25/2018   Patient is on Vitamin D supplement but remained well below goal of 70 at last check:   Lab Results  Component Value Date   VD25OH 48 12/25/2018     Patient is prescribed  allopurinol for gout but doesn't take and does not report a recent flare.  Lab Results  Component Value Date   LABURIC 8.5 (H) 12/25/2018      Current Medications:  Current Outpatient Medications on File Prior to Visit  Medication Sig  . allopurinol (ZYLOPRIM) 300 MG tablet Take 1 tablet Daily to prevent Gout  . aspirin 81 MG tablet Take 81 mg by mouth daily. Reported on 09/23/2015  . atorvastatin (LIPITOR) 40 MG tablet Take 1 tablet (40 mg total) by mouth daily at 6 PM.  . bisoprolol (ZEBETA) 10 MG tablet Take 1 tablet at night for BP  . Blood Glucose Monitoring Suppl (FREESTYLE LITE) DEVI 1 Device by Does not apply route 3 (three) times daily.  . Cholecalciferol (VITAMIN D PO) Take 10,000 Units by mouth daily.  Marland Kitchen glucose blood (FREESTYLE LITE) test strip Use as instructed  . Lancets (FREESTYLE) lancets Use as instructed  . losartan-hydrochlorothiazide (HYZAAR) 100-12.5 MG tablet Take 1 tablet by mouth daily.  . meloxicam (MOBIC) 7.5 MG tablet TAKE 1 TABLET (7.5 MG TOTAL) BY MOUTH AS NEEDED  . metFORMIN (GLUCOPHAGE-XR) 500 MG 24 hr tablet TAKE 2 TABLETS TWICE /DAILY AS DIRECTED FOR DIABETES  . OVER THE COUNTER MEDICATION Takes a MVI.  Marland Kitchen phentermine (ADIPEX-P) 37.5 MG tablet Take 1/2 to 1 tablet every morning for Dieting & Weight  Loss  . Zinc 50 MG TABS Take 1 tablet Daily   No current facility-administered medications on file prior to visit.      Allergies:  Allergies  Allergen Reactions  . Codeine Nausea And Vomiting  . Nsaids Other (See Comments)    Bleeding ulcer 2013     Medical History:  Past Medical History:  Diagnosis Date  . Allergy    seasonal  . Blood transfusion 09/2011   2 units  . Hyperlipidemia   . Hypertension   . Renal disorder   . Stomach ulcer    Family history- Reviewed and unchanged Social history- Reviewed and unchanged   Review of Systems:  Review of Systems  Constitutional: Negative for malaise/fatigue and weight loss.  HENT: Negative  for hearing loss and tinnitus.   Eyes: Negative for blurred vision and double vision.  Respiratory: Negative for cough, shortness of breath and wheezing.   Cardiovascular: Negative for chest pain, palpitations, orthopnea, claudication and leg swelling.  Gastrointestinal: Negative for abdominal pain, blood in stool, constipation, diarrhea, heartburn, melena, nausea and vomiting.  Genitourinary: Negative.   Musculoskeletal: Negative for joint pain and myalgias.  Skin: Negative for rash.  Neurological: Negative for dizziness, tingling, sensory change, weakness and headaches.  Endo/Heme/Allergies: Negative for polydipsia.  Psychiatric/Behavioral: Negative.   All other systems reviewed and are negative.    Physical Exam: There were no vitals taken for this visit. Wt Readings from Last 3 Encounters:  09/05/18 263 lb (119.3 kg)  07/23/18 264 lb (119.7 kg)  06/04/18 275 lb 6.4 oz (124.9 kg)   General Appearance: Well nourished, in no apparent distress. Eyes: PERRLA, EOMs, conjunctiva no swelling or erythema Sinuses: No Frontal/maxillary tenderness ENT/Mouth: Ext aud canals clear, TMs without erythema, bulging. No erythema, swelling, or exudate on  post pharynx.  Tonsils not swollen or erythematous. Hearing normal.  Neck: Supple, thyroid normal.  Respiratory: Respiratory effort normal, BS equal bilaterally without rales, rhonchi, wheezing or stridor.  Cardio: RRR with no MRGs. Brisk peripheral pulses without edema.  Abdomen: Soft, + BS.  Non tender, no guarding, rebound, hernias, masses. Lymphatics: Non tender without lymphadenopathy.  Musculoskeletal: Full ROM, 5/5 strength, Normal gait Skin: Warm, dry without rashes, lesions, ecchymosis.  Neuro: Cranial nerves intact. No cerebellar symptoms.  Psych: Awake and oriented X 3, normal affect, Insight and Judgment appropriate.    Vicie Mutters, PA-C 9:48 AM [facility]

## 2019-03-26 ENCOUNTER — Ambulatory Visit: Payer: Commercial Managed Care - PPO | Admitting: Physician Assistant

## 2019-04-02 NOTE — Progress Notes (Deleted)
FOLLOW UP  Assessment and Plan:   Hypertension Elevated; off of meds x 2 weeks, emphasized need for adherence and risk of inconsistent treatment Monitor blood pressure at home; patient to call if consistently greater than 130/80 Continue DASH diet.   Reminder to go to the ER if any CP, SOB, nausea, dizziness, severe HA, changes vision/speech, left arm numbness and tingling and jaw pain.  Cholesterol Currently above goal; has been off of atorvastatin 10 mg daily - change to 40 mg tabs, discussed goal LDL <70 Continue low cholesterol diet and exercise.  Check lipid panel.   Diabetes with diabetic chronic kidney disease Continue medication: metformin, increase to max 2000 mg daily Sent in glucometer and supplies to start checking fasting glucose daily  Continue diet and exercise.  Perform daily foot/skin check, notify office of any concerning changes.  Check A1C  Morbid Obesity with co morbidities Long discussion about weight loss, diet, and exercise Recommended diet heavy in fruits and veggies and low in animal meats, cheeses, and dairy products, appropriate calorie intake Discussed ideal weight for height  Patient has done excellent work with cutting out soda, discussed improved breakfast and afternoon snack options Patient on phentermine with benefit and no SE, taking drug breaks; continue close follow up. Will follow up in 3 months  Vitamin D Def Continue supplementation, discussed goal of 70-100 Check vitamin D level  Gout Patient preference is currently off allopurinol, no recent flares since cutting out soda  Diet discussed Check uric acid as needed   Continue diet and meds as discussed. Further disposition pending results of labs. Discussed med's effects and SE's.   Over 30 minutes of exam, counseling, chart review, and critical decision making was performed.   Future Appointments  Date Time Provider [place]  04/04/2019 10:30 AM Vicie Mutters, PA-C  [facility] None  07/23/2019  9:00 AM [name], MD [facility] None    ----------------------------------------------------------------------------------------------------------------------  HPI 58 y.o. male  presents for 3 month follow up on hypertension, cholesterol, diabetes, morbid obesity, gout and vitamin D deficiency.   he is prescribed phentermine for weight loss.  While on the medication they have lost 12 lbs since last visit. They deny palpitations, anxiety, trouble sleeping, elevated BP.   BMI is There is no height or weight on file to calculate BMI., he is working on diet and exercise. Has been walking and moving more. He has cut out soda, sweet tea, drinking water and unsweet tea, estimated 72 fluid ounces daily.  Wt Readings from Last 3 Encounters:  09/05/18 263 lb (119.3 kg)  07/23/18 264 lb (119.7 kg)  06/04/18 275 lb 6.4 oz (124.9 kg)   He has not been checking BP at home for the last few weeks, has been off of BP meds, today their BP is    He does workout. He denies chest pain, shortness of breath, dizziness.   He is on cholesterol medication (atorvastatin 10 mg daily) and denies myalgias. His cholesterol is not at goal. The cholesterol last visit was:   Lab Results  Component Value Date   CHOL 185 12/25/2018   HDL 40 12/25/2018   LDLCALC 119 (H) 12/25/2018   TRIG 138 12/25/2018   CHOLHDL 4.6 12/25/2018    He has not been working on diet and exercise for T2 diabetes on metformin 1000 mg daily, and denies foot ulcerations, hyperglycemia, increased appetite, nausea, paresthesia of the feet, polydipsia, polyuria, visual disturbances, vomiting and weight loss. He does not have meter to check sugars.  Last A1C in the office was:  Lab Results  Component Value Date   HGBA1C 7.3 (H) 12/25/2018   Patient is on Vitamin D supplement but remained well below goal of 70 at last check:   Lab Results  Component Value Date   VD25OH 48 12/25/2018     Patient is prescribed  allopurinol for gout but doesn't take and does not report a recent flare.  Lab Results  Component Value Date   LABURIC 8.5 (H) 12/25/2018      Current Medications:  Current Outpatient Medications on File Prior to Visit  Medication Sig  . allopurinol (ZYLOPRIM) 300 MG tablet Take 1 tablet Daily to prevent Gout  . aspirin 81 MG tablet Take 81 mg by mouth daily. Reported on 09/23/2015  . atorvastatin (LIPITOR) 40 MG tablet Take 1 tablet (40 mg total) by mouth daily at 6 PM.  . bisoprolol (ZEBETA) 10 MG tablet Take 1 tablet at night for BP  . Blood Glucose Monitoring Suppl (FREESTYLE LITE) DEVI 1 Device by Does not apply route 3 (three) times daily.  . Cholecalciferol (VITAMIN D PO) Take 10,000 Units by mouth daily.  Marland Kitchen glucose blood (FREESTYLE LITE) test strip Use as instructed  . Lancets (FREESTYLE) lancets Use as instructed  . losartan-hydrochlorothiazide (HYZAAR) 100-12.5 MG tablet Take 1 tablet by mouth daily.  . meloxicam (MOBIC) 7.5 MG tablet TAKE 1 TABLET (7.5 MG TOTAL) BY MOUTH AS NEEDED  . metFORMIN (GLUCOPHAGE-XR) 500 MG 24 hr tablet TAKE 2 TABLETS TWICE /DAILY AS DIRECTED FOR DIABETES  . OVER THE COUNTER MEDICATION Takes a MVI.  Marland Kitchen phentermine (ADIPEX-P) 37.5 MG tablet Take 1/2 to 1 tablet every morning for Dieting & Weight  Loss  . Zinc 50 MG TABS Take 1 tablet Daily   No current facility-administered medications on file prior to visit.      Allergies:  Allergies  Allergen Reactions  . Codeine Nausea And Vomiting  . Nsaids Other (See Comments)    Bleeding ulcer 2013     Medical History:  Past Medical History:  Diagnosis Date  . Allergy    seasonal  . Blood transfusion 09/2011   2 units  . Hyperlipidemia   . Hypertension   . Renal disorder   . Stomach ulcer    Family history- Reviewed and unchanged Social history- Reviewed and unchanged   Review of Systems:  Review of Systems  Constitutional: Negative for malaise/fatigue and weight loss.  HENT: Negative  for hearing loss and tinnitus.   Eyes: Negative for blurred vision and double vision.  Respiratory: Negative for cough, shortness of breath and wheezing.   Cardiovascular: Negative for chest pain, palpitations, orthopnea, claudication and leg swelling.  Gastrointestinal: Negative for abdominal pain, blood in stool, constipation, diarrhea, heartburn, melena, nausea and vomiting.  Genitourinary: Negative.   Musculoskeletal: Negative for joint pain and myalgias.  Skin: Negative for rash.  Neurological: Negative for dizziness, tingling, sensory change, weakness and headaches.  Endo/Heme/Allergies: Negative for polydipsia.  Psychiatric/Behavioral: Negative.   All other systems reviewed and are negative.    Physical Exam: There were no vitals taken for this visit. Wt Readings from Last 3 Encounters:  09/05/18 263 lb (119.3 kg)  07/23/18 264 lb (119.7 kg)  06/04/18 275 lb 6.4 oz (124.9 kg)   General Appearance: Well nourished, in no apparent distress. Eyes: PERRLA, EOMs, conjunctiva no swelling or erythema Sinuses: No Frontal/maxillary tenderness ENT/Mouth: Ext aud canals clear, TMs without erythema, bulging. No erythema, swelling, or exudate on post pharynx.  Tonsils not swollen or erythematous. Hearing normal.  Neck: Supple, thyroid normal.  Respiratory: Respiratory effort normal, BS equal bilaterally without rales, rhonchi, wheezing or stridor.  Cardio: RRR with no MRGs. Brisk peripheral pulses without edema.  Abdomen: Soft, + BS.  Non tender, no guarding, rebound, hernias, masses. Lymphatics: Non tender without lymphadenopathy.  Musculoskeletal: Full ROM, 5/5 strength, Normal gait Skin: Warm, dry without rashes, lesions, ecchymosis.  Neuro: Cranial nerves intact. No cerebellar symptoms.  Psych: Awake and oriented X 3, normal affect, Insight and Judgment appropriate.    Vicie Mutters, PA-C 9:30 AM [facility]

## 2019-04-04 ENCOUNTER — Ambulatory Visit: Payer: Commercial Managed Care - PPO | Admitting: Physician Assistant

## 2019-04-17 NOTE — Progress Notes (Deleted)
FOLLOW UP  Assessment and Plan:   Hypertension Elevated; off of meds x 2 weeks, emphasized need for adherence and risk of inconsistent treatment Monitor blood pressure at home; patient to call if consistently greater than 130/80 Continue DASH diet.   Reminder to go to the ER if any CP, SOB, nausea, dizziness, severe HA, changes vision/speech, left arm numbness and tingling and jaw pain.  Cholesterol Currently above goal; has been off of atorvastatin 10 mg daily - change to 40 mg tabs, discussed goal LDL <70 Continue low cholesterol diet and exercise.  Check lipid panel.   Diabetes with diabetic chronic kidney disease Continue medication: metformin, increase to max 2000 mg daily Sent in glucometer and supplies to start checking fasting glucose daily  Continue diet and exercise.  Perform daily foot/skin check, notify office of any concerning changes.  Check A1C  Morbid Obesity with co morbidities Long discussion about weight loss, diet, and exercise Recommended diet heavy in fruits and veggies and low in animal meats, cheeses, and dairy products, appropriate calorie intake Discussed ideal weight for height  Patient has done excellent work with cutting out soda, discussed improved breakfast and afternoon snack options Patient on phentermine with benefit and no SE, taking drug breaks; continue close follow up. Will follow up in 3 months  Vitamin D Def Continue supplementation, discussed goal of 70-100 Check vitamin D level  Gout Patient preference is currently off allopurinol, no recent flares since cutting out soda  Diet discussed Check uric acid as needed   Continue diet and meds as discussed. Further disposition pending results of labs. Discussed med's effects and SE's.   Over 30 minutes of exam, counseling, chart review, and critical decision making was performed.   Future Appointments  Date Time Provider [place]  04/19/2019  9:00 AM Vicie Mutters, PA-C  [facility] None  07/23/2019  9:00 AM [name], MD [facility] None    ----------------------------------------------------------------------------------------------------------------------  HPI 58 y.o. male  presents for 3 month follow up on hypertension, cholesterol, diabetes, morbid obesity, gout and vitamin D deficiency.   he is prescribed phentermine for weight loss.  While on the medication they have lost 12 lbs since last visit. They deny palpitations, anxiety, trouble sleeping, elevated BP.   BMI is There is no height or weight on file to calculate BMI., he is working on diet and exercise. Has been walking and moving more. He has cut out soda, sweet tea, drinking water and unsweet tea, estimated 72 fluid ounces daily.  Wt Readings from Last 3 Encounters:  09/05/18 263 lb (119.3 kg)  07/23/18 264 lb (119.7 kg)  06/04/18 275 lb 6.4 oz (124.9 kg)   He has not been checking BP at home for the last few weeks, has been off of BP meds, today their BP is    He does workout. He denies chest pain, shortness of breath, dizziness.   He is on cholesterol medication (atorvastatin 10 mg daily) and denies myalgias. His cholesterol is not at goal. The cholesterol last visit was:   Lab Results  Component Value Date   CHOL 185 12/25/2018   HDL 40 12/25/2018   LDLCALC 119 (H) 12/25/2018   TRIG 138 12/25/2018   CHOLHDL 4.6 12/25/2018    He has not been working on diet and exercise for T2 diabetes on metformin 1000 mg daily, and denies foot ulcerations, hyperglycemia, increased appetite, nausea, paresthesia of the feet, polydipsia, polyuria, visual disturbances, vomiting and weight loss. He does not have meter to check  sugars.  Last A1C in the office was:  Lab Results  Component Value Date   HGBA1C 7.3 (H) 12/25/2018   Patient is on Vitamin D supplement but remained well below goal of 70 at last check:   Lab Results  Component Value Date   VD25OH 48 12/25/2018     Patient is prescribed  allopurinol for gout but doesn't take and does not report a recent flare.  Lab Results  Component Value Date   LABURIC 8.5 (H) 12/25/2018      Current Medications:  Current Outpatient Medications on File Prior to Visit  Medication Sig  . allopurinol (ZYLOPRIM) 300 MG tablet Take 1 tablet Daily to prevent Gout  . aspirin 81 MG tablet Take 81 mg by mouth daily. Reported on 09/23/2015  . atorvastatin (LIPITOR) 40 MG tablet Take 1 tablet (40 mg total) by mouth daily at 6 PM.  . bisoprolol (ZEBETA) 10 MG tablet Take 1 tablet at night for BP  . Blood Glucose Monitoring Suppl (FREESTYLE LITE) DEVI 1 Device by Does not apply route 3 (three) times daily.  . Cholecalciferol (VITAMIN D PO) Take 10,000 Units by mouth daily.  Marland Kitchen glucose blood (FREESTYLE LITE) test strip Use as instructed  . Lancets (FREESTYLE) lancets Use as instructed  . losartan-hydrochlorothiazide (HYZAAR) 100-12.5 MG tablet Take 1 tablet by mouth daily.  . meloxicam (MOBIC) 7.5 MG tablet TAKE 1 TABLET (7.5 MG TOTAL) BY MOUTH AS NEEDED  . metFORMIN (GLUCOPHAGE-XR) 500 MG 24 hr tablet TAKE 2 TABLETS TWICE /DAILY AS DIRECTED FOR DIABETES  . OVER THE COUNTER MEDICATION Takes a MVI.  Marland Kitchen phentermine (ADIPEX-P) 37.5 MG tablet Take 1/2 to 1 tablet every morning for Dieting & Weight  Loss  . Zinc 50 MG TABS Take 1 tablet Daily   No current facility-administered medications on file prior to visit.      Allergies:  Allergies  Allergen Reactions  . Codeine Nausea And Vomiting  . Nsaids Other (See Comments)    Bleeding ulcer 2013     Medical History:  Past Medical History:  Diagnosis Date  . Allergy    seasonal  . Blood transfusion 09/2011   2 units  . Hyperlipidemia   . Hypertension   . Renal disorder   . Stomach ulcer    Family history- Reviewed and unchanged Social history- Reviewed and unchanged   Review of Systems:  Review of Systems  Constitutional: Negative for malaise/fatigue and weight loss.  HENT: Negative  for hearing loss and tinnitus.   Eyes: Negative for blurred vision and double vision.  Respiratory: Negative for cough, shortness of breath and wheezing.   Cardiovascular: Negative for chest pain, palpitations, orthopnea, claudication and leg swelling.  Gastrointestinal: Negative for abdominal pain, blood in stool, constipation, diarrhea, heartburn, melena, nausea and vomiting.  Genitourinary: Negative.   Musculoskeletal: Negative for joint pain and myalgias.  Skin: Negative for rash.  Neurological: Negative for dizziness, tingling, sensory change, weakness and headaches.  Endo/Heme/Allergies: Negative for polydipsia.  Psychiatric/Behavioral: Negative.   All other systems reviewed and are negative.    Physical Exam: There were no vitals taken for this visit. Wt Readings from Last 3 Encounters:  09/05/18 263 lb (119.3 kg)  07/23/18 264 lb (119.7 kg)  06/04/18 275 lb 6.4 oz (124.9 kg)   General Appearance: Well nourished, in no apparent distress. Eyes: PERRLA, EOMs, conjunctiva no swelling or erythema Sinuses: No Frontal/maxillary tenderness ENT/Mouth: Ext aud canals clear, TMs without erythema, bulging. No erythema, swelling, or exudate on  post pharynx.  Tonsils not swollen or erythematous. Hearing normal.  Neck: Supple, thyroid normal.  Respiratory: Respiratory effort normal, BS equal bilaterally without rales, rhonchi, wheezing or stridor.  Cardio: RRR with no MRGs. Brisk peripheral pulses without edema.  Abdomen: Soft, + BS.  Non tender, no guarding, rebound, hernias, masses. Lymphatics: Non tender without lymphadenopathy.  Musculoskeletal: Full ROM, 5/5 strength, Normal gait Skin: Warm, dry without rashes, lesions, ecchymosis.  Neuro: Cranial nerves intact. No cerebellar symptoms.  Psych: Awake and oriented X 3, normal affect, Insight and Judgment appropriate.    Vicie Mutters, PA-C 12:10 PM [facility]

## 2019-04-19 ENCOUNTER — Ambulatory Visit: Payer: Commercial Managed Care - PPO | Admitting: Physician Assistant

## 2019-04-23 NOTE — Progress Notes (Signed)
FOLLOW UP  Assessment and Plan:   Hypertension Elevated; off of meds x 2 weeks, emphasized need for adherence and risk of inconsistent treatment- resent in medications Monitor blood pressure at home; patient to call if consistently greater than 130/80 Continue DASH diet.   Reminder to go to the ER if any CP, SOB, nausea, dizziness, severe HA, changes vision/speech, left arm numbness and tingling and jaw pain.  Cholesterol Currently above goal; continue lipitor 40 mg Continue low cholesterol diet and exercise.  Check lipid panel.   Diabetes with diabetic chronic kidney disease Continue medication: start back on metformin will do 750 BID Sent in glucometer and supplies to start checking fasting glucose daily  Continue diet and exercise.  Perform daily foot/skin check, notify office of any concerning changes.  Check A1C  Morbid Obesity with co morbidities Long discussion about weight loss, diet, and exercise Recommended diet heavy in fruits and veggies and low in animal meats, cheeses, and dairy products, appropriate calorie intake Discussed ideal weight for height  Will follow up in 3 months  Vitamin D Def Continue supplementation, discussed goal of 70-100 Check vitamin D level  Gout Patient preference is currently off allopurinol, no recent flares since cutting out soda  Diet discussed Check uric acid as needed   Continue diet and meds as discussed. Further disposition pending results of labs. Discussed med's effects and SE's.   Over 30 minutes of exam, counseling, chart review, and critical decision making was performed.   Future Appointments  Date Time Provider [place]  04/24/2019 11:30 AM Vicie Mutters, PA-C [facility] None  07/23/2019  9:00 AM [name], MD [facility] None    ----------------------------------------------------------------------------------------------------------------------  HPI 58 y.o. male  presents for 3 month follow up on  hypertension, cholesterol, diabetes, morbid obesity, gout and vitamin D deficiency.   BMI is Body mass index is 36.46 kg/m., he is not working on diet right now. He has moved and is switching jobs so is under a lot of stress. He has packed his medications up and admits to not taking them consistently. Wt Readings from Last 3 Encounters:  04/24/19 267 lb (121.1 kg)  09/05/18 263 lb (119.3 kg)  07/23/18 264 lb (119.7 kg)   He has not been checking BP at home for the last few weeks, has been off of BP meds, today their BP is BP: (!) 160/80  He does workout. He denies chest pain, shortness of breath, dizziness.   He has not been working on diet and exercise for T2 diabetes CKD stage 2 he is on losartan Hyperlipidemia lipitor 40 mg NOT taking  metformin 1000 mg daily denies foot ulcerations, hyperglycemia, increased appetite, nausea, paresthesia of the feet, polydipsia, polyuria, visual disturbances, vomiting and weight loss.  He does not check sugars.   Last A1C in the office was:  Lab Results  Component Value Date   HGBA1C 7.3 (H) 12/25/2018   Lab Results  Component Value Date   GFRNONAA 85 12/25/2018   Lab Results  Component Value Date   CHOL 185 12/25/2018   HDL 40 12/25/2018   LDLCALC 119 (H) 12/25/2018   TRIG 138 12/25/2018   CHOLHDL 4.6 12/25/2018    Patient is on Vitamin D supplement but remained well below goal of 70 at last check:   Lab Results  Component Value Date   VD25OH 48 12/25/2018     Patient is prescribed allopurinol for gout but doesn't take and does not report a recent flare.  Lab Results  Component Value Date   LABURIC 8.5 (H) 12/25/2018      Current Medications:  Current Outpatient Medications on File Prior to Visit  Medication Sig  . aspirin 81 MG tablet Take 81 mg by mouth daily. Reported on 09/23/2015  . atorvastatin (LIPITOR) 40 MG tablet Take 1 tablet (40 mg total) by mouth daily at 6 PM.  . Cholecalciferol (VITAMIN D PO) Take 10,000 Units  by mouth daily.  Marland Kitchen losartan-hydrochlorothiazide (HYZAAR) 100-12.5 MG tablet Take 1 tablet by mouth daily.  . meloxicam (MOBIC) 7.5 MG tablet TAKE 1 TABLET (7.5 MG TOTAL) BY MOUTH AS NEEDED  . Zinc 50 MG TABS Take 1 tablet Daily  . allopurinol (ZYLOPRIM) 300 MG tablet Take 1 tablet Daily to prevent Gout (Patient not taking: Reported on 04/24/2019)  . bisoprolol (ZEBETA) 10 MG tablet Take 1 tablet at night for BP (Patient not taking: Reported on 04/24/2019)  . Blood Glucose Monitoring Suppl (FREESTYLE LITE) DEVI 1 Device by Does not apply route 3 (three) times daily. (Patient not taking: Reported on 04/24/2019)  . glucose blood (FREESTYLE LITE) test strip Use as instructed (Patient not taking: Reported on 04/24/2019)  . Lancets (FREESTYLE) lancets Use as instructed (Patient not taking: Reported on 04/24/2019)  . metFORMIN (GLUCOPHAGE-XR) 500 MG 24 hr tablet TAKE 2 TABLETS TWICE /DAILY AS DIRECTED FOR DIABETES (Patient not taking: Reported on 04/24/2019)  . OVER THE COUNTER MEDICATION Takes a MVI.  Marland Kitchen phentermine (ADIPEX-P) 37.5 MG tablet Take 1/2 to 1 tablet every morning for Dieting & Weight  Loss (Patient not taking: Reported on 04/24/2019)   No current facility-administered medications on file prior to visit.      Allergies:  Allergies  Allergen Reactions  . Codeine Nausea And Vomiting  . Nsaids Other (See Comments)    Bleeding ulcer 2013     Medical History:  Past Medical History:  Diagnosis Date  . Allergy    seasonal  . Blood transfusion 09/2011   2 units  . Hyperlipidemia   . Hypertension   . Renal disorder   . Stomach ulcer    Family history- Reviewed and unchanged Social history- Reviewed and unchanged   Review of Systems:  Review of Systems  Constitutional: Negative for malaise/fatigue and weight loss.  HENT: Negative for hearing loss and tinnitus.   Eyes: Negative for blurred vision and double vision.  Respiratory: Negative for cough, shortness of breath and  wheezing.   Cardiovascular: Negative for chest pain, palpitations, orthopnea, claudication and leg swelling.  Gastrointestinal: Negative for abdominal pain, blood in stool, constipation, diarrhea, heartburn, melena, nausea and vomiting.  Genitourinary: Negative.   Musculoskeletal: Negative for joint pain and myalgias.  Skin: Negative for rash.  Neurological: Negative for dizziness, tingling, sensory change, weakness and headaches.  Endo/Heme/Allergies: Negative for polydipsia.  Psychiatric/Behavioral: Negative.   All other systems reviewed and are negative.    Physical Exam: BP (!) 160/80   Pulse 84   Temp 98.1 F (36.7 C)   Resp 14   Wt 267 lb (121.1 kg)   BMI 36.46 kg/m  Wt Readings from Last 3 Encounters:  04/24/19 267 lb (121.1 kg)  09/05/18 263 lb (119.3 kg)  07/23/18 264 lb (119.7 kg)   General Appearance: Well nourished, in no apparent distress. Eyes: PERRLA, EOMs, conjunctiva no swelling or erythema Sinuses: No Frontal/maxillary tenderness ENT/Mouth: Ext aud canals clear, TMs without erythema, bulging. No erythema, swelling, or exudate on post pharynx.  Tonsils not swollen or erythematous. Hearing normal.  Neck: Supple,  thyroid normal.  Respiratory: Respiratory effort normal, BS equal bilaterally without rales, rhonchi, wheezing or stridor.  Cardio: RRR with no MRGs. Brisk peripheral pulses without edema.  Abdomen: Soft, + BS.  Non tender, no guarding, rebound, hernias, masses. Lymphatics: Non tender without lymphadenopathy.  Musculoskeletal: Full ROM, 5/5 strength, Normal gait Skin: Warm, dry without rashes, lesions, ecchymosis.  Neuro: Cranial nerves intact. No cerebellar symptoms.  Psych: Awake and oriented X 3, normal affect, Insight and Judgment appropriate.    Vicie Mutters, PA-C 11:04 AM [facility]

## 2019-04-24 ENCOUNTER — Ambulatory Visit (INDEPENDENT_AMBULATORY_CARE_PROVIDER_SITE_OTHER): Payer: Commercial Managed Care - PPO | Admitting: Physician Assistant

## 2019-04-24 ENCOUNTER — Encounter: Payer: Self-pay | Admitting: Physician Assistant

## 2019-04-24 ENCOUNTER — Other Ambulatory Visit: Payer: Self-pay

## 2019-04-24 VITALS — BP 160/80 | HR 84 | Temp 98.1°F | Resp 14 | Wt 267.0 lb

## 2019-04-24 DIAGNOSIS — E1122 Type 2 diabetes mellitus with diabetic chronic kidney disease: Secondary | ICD-10-CM

## 2019-04-24 DIAGNOSIS — I10 Essential (primary) hypertension: Secondary | ICD-10-CM | POA: Diagnosis not present

## 2019-04-24 DIAGNOSIS — Z79899 Other long term (current) drug therapy: Secondary | ICD-10-CM

## 2019-04-24 DIAGNOSIS — E785 Hyperlipidemia, unspecified: Secondary | ICD-10-CM

## 2019-04-24 DIAGNOSIS — N182 Chronic kidney disease, stage 2 (mild): Secondary | ICD-10-CM

## 2019-04-24 DIAGNOSIS — E559 Vitamin D deficiency, unspecified: Secondary | ICD-10-CM | POA: Diagnosis not present

## 2019-04-24 DIAGNOSIS — E119 Type 2 diabetes mellitus without complications: Secondary | ICD-10-CM

## 2019-04-24 DIAGNOSIS — E349 Endocrine disorder, unspecified: Secondary | ICD-10-CM

## 2019-04-24 DIAGNOSIS — M1A9XX0 Chronic gout, unspecified, without tophus (tophi): Secondary | ICD-10-CM

## 2019-04-24 DIAGNOSIS — E1169 Type 2 diabetes mellitus with other specified complication: Secondary | ICD-10-CM

## 2019-04-24 MED ORDER — BISOPROLOL FUMARATE 10 MG PO TABS: ORAL_TABLET | ORAL | 1 refills | Status: DC

## 2019-04-24 MED ORDER — METFORMIN HCL ER 750 MG PO TB24: 750.0000 mg | ORAL_TABLET | Freq: Two times a day (BID) | ORAL | 1 refills | Status: DC

## 2019-04-24 MED ORDER — LOSARTAN POTASSIUM-HCTZ 100-12.5 MG PO TABS: 1.0000 | ORAL_TABLET | Freq: Every day | ORAL | 1 refills | Status: DC

## 2019-04-24 NOTE — Patient Instructions (Signed)
Diabetes is a very complicated disease...lets simplify it.  An easy way to look at it to understand the complications is if you think of the extra sugar floating in your blood stream as glass shards floating through your blood stream.    Diabetes affects your small vessels first: 1) The glass shards (sugar) scraps down the tiny blood vessels in your eyes and lead to diabetic retinopathy, the leading cause of blindness in the [place]. Diabetes is the leading cause of newly diagnosed adult (79 to 58 years of age) blindness in the [place].  2) The glass shards scratches down the tiny vessels of your legs leading to nerve damage called neuropathy and can lead to amputations of your feet. More than 60% of all non-traumatic amputations of lower limbs occur in people with diabetes.  3) Over time the small vessels in your brain are shredded and closed off, individually this does not cause any problems but over a long period of time many of the small vessels being blocked can lead to Vascular Dementia.   4) Your kidney's are a filter system and have a "net" that keeps certain things in the body and lets bad things out. Sugar shreds this net and leads to kidney damage and eventually failure. Decreasing the sugar that is destroying the net and certain blood pressure medications can help stop or decrease progression of kidney disease. Diabetes was the primary cause of kidney failure in 44 percent of all new cases in 2011.  5) Diabetes also destroys the small vessels in your penis that lead to erectile dysfunction. Eventually the vessels are so damaged that you may not be responsive to cialis or viagra.   Diabetes and your large vessels: Your larger vessels consist of your coronary arteries in your heart and the carotid vessels to your brain. Diabetes or even increased sugars put you at 300% increased risk of heart attack and stroke and this is why.. The sugar scrapes down your large blood vessels and your body  sees this as an internal injury and tries to repair itself. Just like you get a scab on your skin, your platelets will stick to the blood vessel wall trying to heal it. This is why we have diabetics on low dose aspirin daily, this prevents the platelets from sticking and can prevent plaque formation. In addition, your body takes cholesterol and tries to shove it into the open wound. This is why we want your LDL, or bad cholesterol, below 70.   The combination of platelets and cholesterol over 5-10 years forms plaque that can break off and cause a heart attack or stroke.   PLEASE REMEMBER:  Diabetes is preventable! Up to 4 percent of complications and morbidities among individuals with type 2 diabetes can be prevented, delayed, or effectively treated and minimized with regular visits to a health professional, appropriate monitoring and medication, and a healthy diet and lifestyle.  General eating tips  What to Avoid . Avoid added sugars o Often added sugar can be found in processed foods such as many condiments, dry cereals, cakes, cookies, chips, crisps, crackers, candies, sweetened drinks, etc.  o Read labels and AVOID/DECREASE use of foods with the following in their ingredient list: Sugar, fructose, high fructose corn syrup, sucrose, glucose, maltose, dextrose, molasses, cane sugar, brown sugar, any type of syrup, agave nectar, etc.   . Avoid snacking in between meals- drink water or if you feel you need a snack, pick a high water content snack such as  cucumbers, watermelon, or any veggie.  Marland Kitchen Avoid foods made with flour o If you are going to eat food made with flour, choose those made with whole-grains; and, minimize your consumption as much as is tolerable . Avoid processed foods o These foods are generally stocked in the middle of the grocery store.  o Focus on shopping on the perimeter of the grocery.  What to Include . Vegetables o GREEN LEAFY VEGETABLES: Kale, spinach, mustard greens,  collard greens, cabbage, broccoli, etc. o OTHER: Asparagus, cauliflower, eggplant, carrots, peas, Brussel sprouts, tomatoes, bell peppers, zucchini, beets, cucumbers, etc. . Grains, seeds, and legumes o Beans: kidney beans, black eyed peas, garbanzo beans, black beans, pinto beans, etc. o Whole, unrefined grains: brown rice, barley, bulgur, oatmeal, etc. . Healthy fats  o Avoid highly processed fats such as vegetable oil o Examples of healthy fats: avocado, olives, virgin olive oil, dark chocolate (?72% Cocoa), nuts (peanuts, almonds, walnuts, cashews, pecans, etc.) o Please still do small amount of these healthy fats, they are dense in calories.  . Low - Moderate Intake of Animal Sources of Protein o Meat sources: chicken, [place], salmon, tuna. Limit to 4 ounces of meat at one time or the size of your palm. o Consider limiting dairy sources, but when choosing dairy focus on: PLAIN [place] yogurt, cottage cheese, high-protein milk . Fruit o Choose berries    Diabetes or even increased sugars put you at 300% increased risk of heart attack and stroke.  ALSO BEING DIABETIC YOU MAY NOT HAVE ANY PAIN WITH A HEART ATTACK.  Even worse of a chance of no pain if you are a woman.  It is very unlikely that you will have any pain with a heart attack. Likely your symptoms will be very subtle, even for very severe disease.  Your symptoms for a heart attack will likely occur when you exert your self or exercise and include: Shortness of breath Sweating Nausea Dizziness Fast or irregular heart beats Fatigue   It makes me feel better if my diabetics get their heart rate up with exercise once or twice a week and pay close attention to your body. If there is ANY change in your exercise capacity or if you have symptoms above, please STOP and call 911 or call to come to the office.   PLEASE REMEMBER:  Diabetes is preventable! Up to 43 percent of complications and morbidities among individuals with type 2  diabetes can be prevented, delayed, or effectively treated and minimized with regular visits to a health professional, appropriate monitoring and medication, and a healthy diet and lifestyle.   Here is some information to help you keep your heart healthy: Move it! - Aim for 30 mins of activity every day. Take it slowly at first. Talk to [place] before starting any new exercise program.   Lose it.  -Body Mass Index (BMI) can indicate if you need to lose weight. A healthy range is 18.5-24.9. For a BMI calculator, go to [employer]  Waist Management -Excess abdominal fat is a risk factor for heart disease, diabetes, asthma, stroke and more. Ideal waist circumference is less than 35" for women and less than 40" for men.   Eat Right -focus on fruits, vegetables, whole grains, and meals you make yourself. Avoid foods with trans fat and high sugar/sodium content.   Snooze or Snore? - Loud snoring can be a sign of sleep apnea, a significant risk factor for high blood pressure, heart attach, stroke, and heart arrhythmias.  Kick  the habit -Quit Smoking! Avoid second hand smoke. A single cigarette raises your blood pressure for 20 mins and increases the risk of heart attack and stroke for the next 24 hours.   Are Aspirin and Supplements right for you? -Add ENTERIC COATED low dose 81 mg Aspirin daily OR can do every other day if you have easy bruising to protect your heart and head. As well as to reduce risk of Colon Cancer by 20 %, Skin Cancer by 26 % , Melanoma by 46% and Pancreatic cancer by 60%  Say "No to Stress -There may be little you can do about problems that cause stress. However, techniques such as long walks, meditation, and exercise can help you manage it.   Start Now! - Make changes one at a time and set reasonable goals to increase your likelihood of success.

## 2019-04-25 LAB — TSH: TSH: 2.5 mIU/L (ref 0.40–4.50)

## 2019-04-25 LAB — COMPLETE METABOLIC PANEL WITH GFR
AG Ratio: 1.5 (calc) (ref 1.0–2.5)
ALT: 47 U/L — ABNORMAL HIGH (ref 9–46)
AST: 32 U/L (ref 10–35)
Albumin: 4.3 g/dL (ref 3.6–5.1)
Alkaline phosphatase (APISO): 50 U/L (ref 35–144)
BUN: 12 mg/dL (ref 7–25)
CO2: 26 mmol/L (ref 20–32)
Calcium: 9.6 mg/dL (ref 8.6–10.3)
Chloride: 101 mmol/L (ref 98–110)
Creat: 1.02 mg/dL (ref 0.70–1.33)
GFR, Est African American: 93 mL/min/{1.73_m2} (ref >=60)
GFR, Est Non African American: 81 mL/min/{1.73_m2} (ref >=60)
Globulin: 2.9 g/dL (calc) (ref 1.9–3.7)
Glucose, Bld: 269 mg/dL — ABNORMAL HIGH (ref 65–99)
Potassium: 4 mmol/L (ref 3.5–5.3)
Sodium: 137 mmol/L (ref 135–146)
Total Bilirubin: 0.7 mg/dL (ref 0.2–1.2)
Total Protein: 7.2 g/dL (ref 6.1–8.1)

## 2019-04-25 LAB — CBC WITH DIFFERENTIAL/PLATELET
Absolute Monocytes: 419 cells/uL (ref 200–950)
Basophils Absolute: 59 cells/uL (ref 0–200)
Basophils Relative: 1 %
Eosinophils Absolute: 189 cells/uL (ref 15–500)
Eosinophils Relative: 3.2 %
HCT: 46.6 % (ref 38.5–50.0)
Hemoglobin: 15.9 g/dL (ref 13.2–17.1)
Lymphs Abs: 2047 cells/uL (ref 850–3900)
MCH: 31.5 pg (ref 27.0–33.0)
MCHC: 34.1 g/dL (ref 32.0–36.0)
MCV: 92.5 fL (ref 80.0–100.0)
MPV: 10.3 fL (ref 7.5–12.5)
Monocytes Relative: 7.1 %
Neutro Abs: 3186 cells/uL (ref 1500–7800)
Neutrophils Relative %: 54 %
Platelets: 226 10*3/uL (ref 140–400)
RBC: 5.04 10*6/uL (ref 4.20–5.80)
RDW: 11.8 % (ref 11.0–15.0)
Total Lymphocyte: 34.7 %
WBC: 5.9 10*3/uL (ref 3.8–10.8)

## 2019-04-25 LAB — LIPID PANEL
Cholesterol: 189 mg/dL (ref <200)
HDL: 45 mg/dL (ref >=40)
LDL Cholesterol (Calc): 115 mg/dL (calc) — ABNORMAL HIGH
Non-HDL Cholesterol (Calc): 144 mg/dL (calc) — ABNORMAL HIGH (ref <130)
Total CHOL/HDL Ratio: 4.2 (calc) (ref <5.0)
Triglycerides: 169 mg/dL — ABNORMAL HIGH (ref <150)

## 2019-04-25 LAB — MAGNESIUM: Magnesium: 1.6 mg/dL (ref 1.5–2.5)

## 2019-04-25 LAB — HEMOGLOBIN A1C
Hgb A1c MFr Bld: 7.7 % of total Hgb — ABNORMAL HIGH (ref <5.7)
Mean Plasma Glucose: 174 (calc)
eAG (mmol/L): 9.7 (calc)

## 2019-04-25 LAB — URIC ACID: Uric Acid, Serum: 7.6 mg/dL (ref 4.0–8.0)

## 2019-04-25 LAB — VITAMIN D 25 HYDROXY (VIT D DEFICIENCY, FRACTURES): Vit D, 25-Hydroxy: 49 ng/mL (ref 30–100)

## 2019-06-08 LAB — HM DIABETES EYE EXAM

## 2019-06-18 ENCOUNTER — Encounter: Payer: Self-pay | Admitting: Internal Medicine

## 2019-07-22 ENCOUNTER — Encounter: Payer: Self-pay | Admitting: Internal Medicine

## 2019-07-22 NOTE — Patient Instructions (Signed)

## 2019-07-22 NOTE — Progress Notes (Addendum)
Annual  Screening/Preventative Visit  & Comprehensive Evaluation & Examination     This very nice 59 y.o. MWM presents for a Screening /Preventative Visit & comprehensive evaluation and management of multiple medical co-morbidities.  Patient has been followed for HTN, HLD, T2_NIDDM  and Vitamin D Deficiency. Patient has hx/o gout and has d/c'd his meds.      HTN predates since 2001. Patient's BP has been controlled at home.  Today's BP was elevated at 150/86 & rechecked at 138/82. Patient denies any cardiac symptoms as chest pain, palpitations, shortness of breath, dizziness or ankle swelling.     Patient's hyperlipidemia is not controlled with diet and sporadic medications. Patient denies myalgias or other medication SE's. Last lipids were not at goal:  Lab Results  Component Value Date   CHOL 189 04/24/2019   HDL 45 04/24/2019   LDLCALC 115 (H) 04/24/2019   TRIG 169 (H) 04/24/2019   CHOLHDL 4.2 04/24/2019      Patient has hx/o Testosterone Deficiency (level "208" / 2013) and is sporadic in compliance w/ parenteral injections.      Patient has (BMI 36+) ) and consequent T2_NIDDM (A1c 7.1% / 2017)  and patient denies reactive hypoglycemic symptoms, visual blurring, diabetic polys or paresthesias.  Dietary compliance is poor and last A1c was not at goal:  Lab Results  Component Value Date   HGBA1C 7.7 (H) 04/24/2019        Finally, patient has history of Vitamin D Deficiency ("20" / 2008) and last vitamin D was still slightly low: Lab Results  Component Value Date   VD25OH 49 04/24/2019    Current Outpatient Medications on File Prior to Visit  Medication Sig  . allopurinol (ZYLOPRIM) 300 MG tablet Take 1 tablet Daily to prevent Gout  . aspirin 81 MG tablet Take 81 mg by mouth daily. Reported on 09/23/2015  . atorvastatin (LIPITOR) 40 MG tablet Take 1 tablet (40 mg total) by mouth daily at 6 PM.  . bisoprolol (ZEBETA) 10 MG tablet Take 1 tablet at night for BP  . Blood Glucose  Monitoring Suppl (FREESTYLE LITE) DEVI 1 Device by Does not apply route 3 (three) times daily.  . Cholecalciferol (VITAMIN D PO) Take 10,000 Units by mouth daily.  Marland Kitchen glucose blood (FREESTYLE LITE) test strip Use as instructed  . Lancets (FREESTYLE) lancets Use as instructed  . losartan-hydrochlorothiazide (HYZAAR) 100-12.5 MG tablet Take 1 tablet by mouth daily.  . meloxicam (MOBIC) 7.5 MG tablet TAKE 1 TABLET (7.5 MG TOTAL) BY MOUTH AS NEEDED  . metFORMIN (GLUCOPHAGE XR) 750 MG 24 hr tablet Take 1 tablet (750 mg total) by mouth 2 (two) times daily with a meal.  . OVER THE COUNTER MEDICATION Takes a MVI.  Marland Kitchen phentermine (ADIPEX-P) 37.5 MG tablet Take 1/2 to 1 tablet every morning for Dieting & Weight  Loss  . Zinc 50 MG TABS Take 1 tablet Daily   No current facility-administered medications on file prior to visit.   Allergies  Allergen Reactions  . Codeine Nausea And Vomiting  . Nsaids Other (See Comments)    Bleeding ulcer 2013   Past Medical History:  Diagnosis Date  . Allergy    seasonal  . Blood transfusion 09/2011   2 units  . Hyperlipidemia   . Hypertension   . Renal disorder   . Stomach ulcer    Health Maintenance  Topic Date Due  . OPHTHALMOLOGY EXAM  [birth year]/1971  . COLONOSCOPY  12/06/2014  . INFLUENZA VACCINE  12/29/2018  . HEMOGLOBIN A1C  10/22/2019  . FOOT EXAM  07/21/2020  . TETANUS/TDAP  10/09/2021  . PNEUMOCOCCAL POLYSACCHARIDE VACCINE AGE 66-64 HIGH RISK  Completed  . Hepatitis C Screening  Completed  . HIV Screening  Completed   Immunization History  Administered Date(s) Administered  . PPD Test 01/02/2014, 03/11/2015, 03/31/2016, 05/03/2017, 06/04/2018  . Pneumococcal Polysaccharide-23 10/04/2010, 05/03/2017  . Tdap 10/10/2011   Last Colon - 12/06/2011 - Dr Henrene Pastor - Recc f/u Colon in 3 yrs - overdue July 2016 - patient aware  Past Surgical History:  Procedure Laterality Date  . ESOPHAGOGASTRODUODENOSCOPY  10/26/2011   Procedure:  ESOPHAGOGASTRODUODENOSCOPY (EGD);  Surgeon: Irene Shipper, MD;  Location: [facility];  Service: Endoscopy;  Laterality: N/A;  . FRACTURE SURGERY  broken collar bone   Family History  Adopted: Yes  Problem Relation Age of Onset  . Breast cancer Mother    Social History   Socioeconomic History  . Marital status: Married    Spouse name: Marita [place]  . Number of children: 1 son   Occupational History  . Not on file  Tobacco Use  . Smoking status: Never Smoker  . Smokeless tobacco: Never Used  Substance and Sexual Activity  . Alcohol use: Yes    Alcohol/week: 1.0 standard drinks    Types: 1 Cans of beer per week    Comment: occasionally   . Drug use: No  . Sexual activity: Not on file    ROS Constitutional: Denies fever, chills, weight loss/gain, headaches, insomnia,  night sweats or change in appetite. Does c/o fatigue. Eyes: Denies redness, blurred vision, diplopia, discharge, itchy or watery eyes.  ENT: Denies discharge, congestion, post nasal drip, epistaxis, sore throat, earache, hearing loss, dental pain, Tinnitus, Vertigo, Sinus pain or snoring.  Cardio: Denies chest pain, palpitations, irregular heartbeat, syncope, dyspnea, diaphoresis, orthopnea, PND, claudication or edema Respiratory: denies cough, dyspnea, DOE, pleurisy, hoarseness, laryngitis or wheezing.  Gastrointestinal: Denies dysphagia, heartburn, reflux, water brash, pain, cramps, nausea, vomiting, bloating, diarrhea, constipation, hematemesis, melena, hematochezia, jaundice or hemorrhoids Genitourinary: Denies dysuria, frequency, urgency, nocturia, hesitancy, discharge, hematuria or flank pain Musculoskeletal: Denies arthralgia, myalgia, stiffness, Jt. Swelling, pain, limp or strain/sprain. Denies Falls. Skin: Denies puritis, rash, hives, warts, acne, eczema or change in skin lesion Neuro: No weakness, tremor, incoordination, spasms, paresthesia or pain Psychiatric: Denies confusion, memory loss or sensory loss. Denies  Depression. Endocrine: Denies change in weight, skin, hair change, nocturia, and paresthesia, diabetic polys, visual blurring or hyper / hypo glycemic episodes.  Heme/Lymph: No excessive bleeding, bruising or enlarged lymph nodes.  Physical Exam  BP (!) 150/86   Pulse 84   Temp (!) 97.4 F (36.3 C)   Resp 16   Ht 6' (1.829 m)   Wt 271 lb 12.8 oz (123.3 kg)   BMI 36.86 kg/m   General Appearance: Well nourished and well groomed and in no apparent distress.  Eyes: PERRLA, EOMs, conjunctiva no swelling or erythema, normal fundi and vessels. Sinuses: No frontal/maxillary tenderness ENT/Mouth: EACs patent / TMs  nl. Nares clear without erythema, swelling, mucoid exudates. Oral hygiene is good. No erythema, swelling, or exudate. Tongue normal, non-obstructing. Tonsils not swollen or erythematous. Hearing normal.  Neck: Supple, thyroid not palpable. No bruits, nodes or JVD. Respiratory: Respiratory effort normal.  BS equal and clear bilateral without rales, rhonci, wheezing or stridor. Cardio: Heart sounds are normal with regular rate and rhythm and no murmurs, rubs or gallops. Peripheral pulses are normal and equal bilaterally without edema. No  aortic or femoral bruits. Chest: symmetric with normal excursions and percussion.  Abdomen: Soft, with Nl bowel sounds. Nontender, no guarding, rebound, hernias, masses, or organomegaly.  Lymphatics: Non tender without lymphadenopathy.  Musculoskeletal: Full ROM all peripheral extremities, joint stability, 5/5 strength, and normal gait. Skin: Warm and dry without rashes, lesions, cyanosis, clubbing or  ecchymosis.  Neuro: Cranial nerves intact, reflexes equal bilaterally. Normal muscle tone, no cerebellar symptoms. Sensation intact to touch, vibratory and Monofilament to the toes bilaterally. Pysch: Alert and oriented X 3 with normal affect, insight and judgment appropriate.   Assessment and Plan  1. Annual Preventative/Screening Exam   2.  Essential hypertension  - EKG 12-Lead - [place], RETROPERITNL ABD,  LTD - Urinalysis, Routine w reflex microscopic - Microalbumin / creatinine urine ratio - CBC with Differential/Platelet - COMPLETE METABOLIC PANEL WITH GFR - Magnesium - TSH  3. Hyperlipidemia associated with type 2 diabetes mellitus ([place])  - EKG 12-Lead - [place], RETROPERITNL ABD,  LTD - Lipid panel - TSH  4. Type 2 diabetes mellitus with stage 2 chronic kidney disease,  without long-term current use of insulin (H[facility])  - EKG 12-Lead - [place], RETROPERITNL ABD,  LTD - Urinalysis, Routine w reflex microscopic - Microalbumin / creatinine urine ratio - HM DIABETES FOOT EXAM - LOW EXTREMITY NEUR EXAM DOCUM - Hemoglobin A1c - Insulin, random  5. Vitamin D deficiency  - VITAMIN D 25 Hydroxy  6. Idiopathic gout, hx   - Uric acid  7. Testosterone deficiency  - Testosterone  8. Screening examination for pulmonary tuberculosis  - TB Skin Test  9. Screening for colorectal cancer  - POC Hemoccult Bld/Stl   10. Prostate cancer screening  - PSA  11. Screening for ischemic heart disease  - EKG 12-Lead  12. Screening for AAA (aortic abdominal aneurysm)  - [place], RETROPERITNL ABD,  LTD  13. Fatigue, unspecified type  - Iron,Total/Total Iron Binding Cap - CBC with Differential/Platelet - TSH - Vitamin B12  14. Medication management  - Urinalysis, Routine w reflex microscopic - Microalbumin / creatinine urine ratio - Testosterone - Uric acid - CBC with Differential/Platelet - COMPLETE METABOLIC PANEL WITH GFR - Magnesium - Lipid panel - TSH - Hemoglobin A1c - Insulin, random - VITAMIN D 25 Hydroxy          Patient was counseled in prudent diet, weight control to achieve/maintain BMI less than 25, BP monitoring, regular exercise and medications as discussed.  Discussed med effects and SE's. Routine screening labs and tests as requested with regular follow-up as recommended. Over 40 minutes of exam,  counseling, chart review and high complex critical decision making was performed   Kirtland Bouchard, MD

## 2019-07-23 ENCOUNTER — Ambulatory Visit (INDEPENDENT_AMBULATORY_CARE_PROVIDER_SITE_OTHER): Payer: 59 | Admitting: Internal Medicine

## 2019-07-23 ENCOUNTER — Other Ambulatory Visit: Payer: Self-pay

## 2019-07-23 VITALS — BP 150/86 | HR 84 | Temp 97.4°F | Resp 16 | Ht 72.0 in | Wt 271.8 lb

## 2019-07-23 DIAGNOSIS — E669 Obesity, unspecified: Secondary | ICD-10-CM

## 2019-07-23 DIAGNOSIS — Z8249 Family history of ischemic heart disease and other diseases of the circulatory system: Secondary | ICD-10-CM

## 2019-07-23 DIAGNOSIS — Z111 Encounter for screening for respiratory tuberculosis: Secondary | ICD-10-CM

## 2019-07-23 DIAGNOSIS — I10 Essential (primary) hypertension: Secondary | ICD-10-CM

## 2019-07-23 DIAGNOSIS — Z0001 Encounter for general adult medical examination with abnormal findings: Secondary | ICD-10-CM

## 2019-07-23 DIAGNOSIS — Z125 Encounter for screening for malignant neoplasm of prostate: Secondary | ICD-10-CM

## 2019-07-23 DIAGNOSIS — Z136 Encounter for screening for cardiovascular disorders: Secondary | ICD-10-CM

## 2019-07-23 DIAGNOSIS — E785 Hyperlipidemia, unspecified: Secondary | ICD-10-CM

## 2019-07-23 DIAGNOSIS — M1000 Idiopathic gout, unspecified site: Secondary | ICD-10-CM

## 2019-07-23 DIAGNOSIS — E1169 Type 2 diabetes mellitus with other specified complication: Secondary | ICD-10-CM

## 2019-07-23 DIAGNOSIS — Z1212 Encounter for screening for malignant neoplasm of rectum: Secondary | ICD-10-CM

## 2019-07-23 DIAGNOSIS — E559 Vitamin D deficiency, unspecified: Secondary | ICD-10-CM

## 2019-07-23 DIAGNOSIS — R5383 Other fatigue: Secondary | ICD-10-CM

## 2019-07-23 DIAGNOSIS — Z0000 Encounter for general adult medical examination without abnormal findings: Secondary | ICD-10-CM | POA: Diagnosis not present

## 2019-07-23 DIAGNOSIS — Z1211 Encounter for screening for malignant neoplasm of colon: Secondary | ICD-10-CM

## 2019-07-23 DIAGNOSIS — Z79899 Other long term (current) drug therapy: Secondary | ICD-10-CM

## 2019-07-23 DIAGNOSIS — N182 Chronic kidney disease, stage 2 (mild): Secondary | ICD-10-CM

## 2019-07-23 DIAGNOSIS — E349 Endocrine disorder, unspecified: Secondary | ICD-10-CM

## 2019-07-23 DIAGNOSIS — E119 Type 2 diabetes mellitus without complications: Secondary | ICD-10-CM

## 2019-07-23 DIAGNOSIS — E1122 Type 2 diabetes mellitus with diabetic chronic kidney disease: Secondary | ICD-10-CM

## 2019-07-23 DIAGNOSIS — E668 Other obesity: Secondary | ICD-10-CM

## 2019-07-23 MED ORDER — TOPIRAMATE 50 MG PO TABS: ORAL_TABLET | ORAL | 1 refills | Status: DC

## 2019-07-23 MED ORDER — BISOPROLOL FUMARATE 10 MG PO TABS: ORAL_TABLET | ORAL | 1 refills | Status: DC

## 2019-07-23 MED ORDER — METFORMIN HCL ER 750 MG PO TB24: ORAL_TABLET | ORAL | 1 refills | Status: DC

## 2019-07-23 MED ORDER — PHENTERMINE HCL 37.5 MG PO TABS: ORAL_TABLET | ORAL | 1 refills | Status: DC

## 2019-07-24 LAB — COMPLETE METABOLIC PANEL WITH GFR
AG Ratio: 1.5 (calc) (ref 1.0–2.5)
ALT: 51 U/L — ABNORMAL HIGH (ref 9–46)
AST: 40 U/L — ABNORMAL HIGH (ref 10–35)
Albumin: 4.1 g/dL (ref 3.6–5.1)
Alkaline phosphatase (APISO): 54 U/L (ref 35–144)
BUN: 16 mg/dL (ref 7–25)
CO2: 26 mmol/L (ref 20–32)
Calcium: 9.3 mg/dL (ref 8.6–10.3)
Chloride: 101 mmol/L (ref 98–110)
Creat: 0.9 mg/dL (ref 0.70–1.33)
GFR, Est African American: 109 mL/min/{1.73_m2} (ref >=60)
GFR, Est Non African American: 94 mL/min/{1.73_m2} (ref >=60)
Globulin: 2.7 g/dL (calc) (ref 1.9–3.7)
Glucose, Bld: 362 mg/dL — ABNORMAL HIGH (ref 65–99)
Potassium: 4.3 mmol/L (ref 3.5–5.3)
Sodium: 136 mmol/L (ref 135–146)
Total Bilirubin: 0.8 mg/dL (ref 0.2–1.2)
Total Protein: 6.8 g/dL (ref 6.1–8.1)

## 2019-07-24 LAB — LIPID PANEL
Cholesterol: 103 mg/dL (ref <200)
HDL: 35 mg/dL — ABNORMAL LOW (ref >=40)
LDL Cholesterol (Calc): 39 mg/dL (calc)
Non-HDL Cholesterol (Calc): 68 mg/dL (calc) (ref <130)
Total CHOL/HDL Ratio: 2.9 (calc) (ref <5.0)
Triglycerides: 230 mg/dL — ABNORMAL HIGH (ref <150)

## 2019-07-24 LAB — CBC WITH DIFFERENTIAL/PLATELET
Absolute Monocytes: 451 cells/uL (ref 200–950)
Basophils Absolute: 43 cells/uL (ref 0–200)
Basophils Relative: 0.7 %
Eosinophils Absolute: 238 cells/uL (ref 15–500)
Eosinophils Relative: 3.9 %
HCT: 47.4 % (ref 38.5–50.0)
Hemoglobin: 16.3 g/dL (ref 13.2–17.1)
Lymphs Abs: 1952 cells/uL (ref 850–3900)
MCH: 31.6 pg (ref 27.0–33.0)
MCHC: 34.4 g/dL (ref 32.0–36.0)
MCV: 91.9 fL (ref 80.0–100.0)
MPV: 10 fL (ref 7.5–12.5)
Monocytes Relative: 7.4 %
Neutro Abs: 3416 cells/uL (ref 1500–7800)
Neutrophils Relative %: 56 %
Platelets: 196 10*3/uL (ref 140–400)
RBC: 5.16 10*6/uL (ref 4.20–5.80)
RDW: 11.7 % (ref 11.0–15.0)
Total Lymphocyte: 32 %
WBC: 6.1 10*3/uL (ref 3.8–10.8)

## 2019-07-24 LAB — VITAMIN B12: Vitamin B-12: 488 pg/mL (ref 200–1100)

## 2019-07-24 LAB — IRON, TOTAL/TOTAL IRON BINDING CAP
%SAT: 38 % (calc) (ref 20–48)
Iron: 124 ug/dL (ref 50–180)
TIBC: 325 mcg/dL (calc) (ref 250–425)

## 2019-07-24 LAB — TSH: TSH: 1.88 mIU/L (ref 0.40–4.50)

## 2019-07-24 LAB — MAGNESIUM: Magnesium: 1.9 mg/dL (ref 1.5–2.5)

## 2019-07-24 LAB — URINALYSIS, ROUTINE W REFLEX MICROSCOPIC
Bilirubin Urine: NEGATIVE
Hgb urine dipstick: NEGATIVE
Ketones, ur: NEGATIVE
Leukocytes,Ua: NEGATIVE
Nitrite: NEGATIVE
Protein, ur: NEGATIVE
Specific Gravity, Urine: 1.028 (ref 1.001–1.03)
pH: 5.5 (ref 5.0–8.0)

## 2019-07-24 LAB — URIC ACID: Uric Acid, Serum: 7.3 mg/dL (ref 4.0–8.0)

## 2019-07-24 LAB — HEMOGLOBIN A1C
Hgb A1c MFr Bld: 8.7 % of total Hgb — ABNORMAL HIGH (ref <5.7)
Mean Plasma Glucose: 203 (calc)
eAG (mmol/L): 11.2 (calc)

## 2019-07-24 LAB — MICROALBUMIN / CREATININE URINE RATIO
Creatinine, Urine: 59 mg/dL (ref 20–320)
Microalb Creat Ratio: 15 mcg/mg creat (ref <30)
Microalb, Ur: 0.9 mg/dL

## 2019-07-24 LAB — INSULIN, RANDOM: Insulin: 44.7 u[IU]/mL — ABNORMAL HIGH

## 2019-07-24 LAB — VITAMIN D 25 HYDROXY (VIT D DEFICIENCY, FRACTURES): Vit D, 25-Hydroxy: 66 ng/mL (ref 30–100)

## 2019-07-24 LAB — TESTOSTERONE: Testosterone: 185 ng/dL — ABNORMAL LOW (ref 250–827)

## 2019-07-24 LAB — PSA: PSA: 0.3 ng/mL (ref <=4.0)

## 2019-08-16 ENCOUNTER — Encounter: Payer: Self-pay | Admitting: Internal Medicine

## 2019-09-16 ENCOUNTER — Other Ambulatory Visit: Payer: Self-pay

## 2019-09-16 ENCOUNTER — Ambulatory Visit (AMBULATORY_SURGERY_CENTER): Payer: Self-pay | Admitting: *Deleted

## 2019-09-16 VITALS — Temp 98.0°F | Ht 72.0 in | Wt 264.0 lb

## 2019-09-16 DIAGNOSIS — Z86010 Personal history of colonic polyps: Secondary | ICD-10-CM

## 2019-09-16 DIAGNOSIS — Z01818 Encounter for other preprocedural examination: Secondary | ICD-10-CM

## 2019-09-16 MED ORDER — SUTAB 1479-225-188 MG PO TABS: 24.0000 | ORAL_TABLET | ORAL | 0 refills | Status: DC

## 2019-09-16 NOTE — Progress Notes (Signed)
No egg or soy allergy known to patient  No issues with past sedation with any surgeries  or procedures, no intubation problems  No diet pills per patient No home 02 use per patient  No blood thinners per patient  Pt denies issues with constipation  No A fib or A flutter  EMMI video sent to pt's e mail   Due to the COVID-19 pandemic we are asking patients to follow these guidelines. Please only bring one care partner. Please be aware that your care partner may wait in the car in the parking lot or if they feel like they will be too hot to wait in the car, they may wait in the lobby on the 4th floor. All care partners are required to wear a mask the entire time (we do not have any that we can provide them), they need to practice social distancing, and we will do a Covid check for all patient's and care partners when you arrive. Also we will check their temperature and your temperature. If the care partner waits in their car they need to stay in the parking lot the entire time and we will call them on their cell phone when the patient is ready for discharge so they can bring the car to the front of the building. Also all patient's will need to wear a mask into building.  Sutab code and coupon

## 2019-09-24 ENCOUNTER — Telehealth: Payer: Self-pay | Admitting: Internal Medicine

## 2019-09-24 DIAGNOSIS — Z86010 Personal history of colonic polyps: Secondary | ICD-10-CM

## 2019-09-24 MED ORDER — SUTAB 1479-225-188 MG PO TABS: 24.0000 | ORAL_TABLET | ORAL | 0 refills | Status: DC

## 2019-09-24 NOTE — Telephone Encounter (Signed)
Sent S[facility] to [facility] per pt's request - Lelan Pons PV

## 2019-09-25 ENCOUNTER — Other Ambulatory Visit: Payer: Self-pay | Admitting: Internal Medicine

## 2019-09-25 DIAGNOSIS — Z860100 Personal history of colon polyps, unspecified: Secondary | ICD-10-CM

## 2019-09-30 ENCOUNTER — Other Ambulatory Visit: Payer: Self-pay | Admitting: Internal Medicine

## 2019-09-30 ENCOUNTER — Ambulatory Visit (INDEPENDENT_AMBULATORY_CARE_PROVIDER_SITE_OTHER): Payer: 59

## 2019-09-30 ENCOUNTER — Encounter: Payer: Self-pay | Admitting: Internal Medicine

## 2019-09-30 DIAGNOSIS — Z1159 Encounter for screening for other viral diseases: Secondary | ICD-10-CM

## 2019-09-30 LAB — SARS CORONAVIRUS 2 (TAT 6-24 HRS): SARS Coronavirus 2: NEGATIVE

## 2019-10-03 ENCOUNTER — Ambulatory Visit (AMBULATORY_SURGERY_CENTER): Payer: 59 | Admitting: Internal Medicine

## 2019-10-03 ENCOUNTER — Encounter: Payer: Self-pay | Admitting: Internal Medicine

## 2019-10-03 ENCOUNTER — Other Ambulatory Visit: Payer: Self-pay

## 2019-10-03 VITALS — BP 102/53 | HR 72 | Temp 95.9°F | Resp 17 | Ht 72.0 in | Wt 264.0 lb

## 2019-10-03 DIAGNOSIS — D123 Benign neoplasm of transverse colon: Secondary | ICD-10-CM | POA: Diagnosis not present

## 2019-10-03 DIAGNOSIS — Z86010 Personal history of colonic polyps: Secondary | ICD-10-CM

## 2019-10-03 DIAGNOSIS — D125 Benign neoplasm of sigmoid colon: Secondary | ICD-10-CM

## 2019-10-03 DIAGNOSIS — D124 Benign neoplasm of descending colon: Secondary | ICD-10-CM

## 2019-10-03 MED ORDER — SODIUM CHLORIDE 0.9 % IV SOLN: 500.0000 mL | Freq: Once | INTRAVENOUS | Status: DC

## 2019-10-03 NOTE — Progress Notes (Signed)
Called to room to assist during endoscopic procedure.  Patient ID and intended procedure confirmed with present staff. Received instructions for my participation in the procedure from the performing physician.  

## 2019-10-03 NOTE — Patient Instructions (Signed)
HANDOUTS PROVIDED ON: POLYPS  The polyps removed today have been sent for pathology.  The results can take 1-3 weeks to receive.  When your next colonoscopy should occur will be based on the pathology results.    You may resume your previous diet and medication schedule.  Thank you for allowing us to care for you today!!!   YOU HAD AN ENDOSCOPIC PROCEDURE TODAY AT THE [facility]:   Refer to the procedure report that was given to you for any specific questions about what was found during the examination.  If the procedure report does not answer your questions, please call your gastroenterologist to clarify.  If you requested that your care partner not be given the details of your procedure findings, then the procedure report has been included in a sealed envelope for you to review at your convenience later.  YOU SHOULD EXPECT: Some feelings of bloating in the abdomen. Passage of more gas than usual.  Walking can help get rid of the air that was put into your GI tract during the procedure and reduce the bloating. If you had a lower endoscopy (such as a colonoscopy or flexible sigmoidoscopy) you may notice spotting of blood in your stool or on the toilet paper. If you underwent a bowel prep for your procedure, you may not have a normal bowel movement for a few days.  Please Note:  You might notice some irritation and congestion in your nose or some drainage.  This is from the oxygen used during your procedure.  There is no need for concern and it should clear up in a day or so.  SYMPTOMS TO REPORT IMMEDIATELY:   Following lower endoscopy (colonoscopy or flexible sigmoidoscopy):  Excessive amounts of blood in the stool  Significant tenderness or worsening of abdominal pains  Swelling of the abdomen that is new, acute  Fever of 100F or higher  For urgent or emergent issues, a gastroenterologist can be reached at any hour by calling [phone]. Do not use MyChart messaging for  urgent concerns.    DIET:  We do recommend a small meal at first, but then you may proceed to your regular diet.  Drink plenty of fluids but you should avoid alcoholic beverages for 24 hours.  ACTIVITY:  You should plan to take it easy for the rest of today and you should NOT DRIVE or use heavy machinery until tomorrow (because of the sedation medicines used during the test).    FOLLOW UP: Our staff will call the number listed on your records 48-72 hours following your procedure to check on you and address any questions or concerns that you may have regarding the information given to you following your procedure. If we do not reach you, we will leave a message.  We will attempt to reach you two times.  During this call, we will ask if you have developed any symptoms of COVID 19. If you develop any symptoms (ie: fever, flu-like symptoms, shortness of breath, cough etc.) before then, please call [phone].  If you test positive for Covid 19 in the 2 weeks post procedure, please call and report this information to us.    If any biopsies were taken you will be contacted by phone or by letter within the next 1-3 weeks.  Please call us at [phone] if you have not heard about the biopsies in 3 weeks.    SIGNATURES/CONFIDENTIALITY: You and/or your care partner have signed paperwork which will be entered into   your electronic medical record.  These signatures attest to the fact that that the information above on your After Visit Summary has been reviewed and is understood.  Full responsibility of the confidentiality of this discharge information lies with you and/or your care-partner.

## 2019-10-03 NOTE — Progress Notes (Signed)
Temp-JB VS-CW  Pt's states no medical or surgical changes since previsit or office visit.  

## 2019-10-03 NOTE — Progress Notes (Signed)
Report to PACU, RN, vss, BBS= Clear.  

## 2019-10-03 NOTE — Op Note (Signed)
[facility] Patient Name: Paul Warner Procedure Date: 10/03/2019 7:58 AM MRN: [id number] Endoscopist: Docia Chuck. Henrene Pastor , MD Age: 59 Referring MD:  Date of Birth: 03-11-1961 Gender: Male Account #: [id number] Procedure:                Colonoscopy with cold snare polypectomy x 4 Indications:              High risk colon cancer surveillance: Personal                            history of multiple (3 or more) adenomas, High risk                            colon cancer surveillance: Personal history of                            sessile serrated colon polyp (less than 10 mm in                            size) with no dysplasia. Index examination July 2013 Medicines:                Monitored Anesthesia Care Procedure:                Pre-Anesthesia Assessment:                           - Prior to the procedure, a History and Physical                            was performed, and patient medications and                            allergies were reviewed. The patient's tolerance of                            previous anesthesia was also reviewed. The risks                            and benefits of the procedure and the sedation                            options and risks were discussed with the patient.                            All questions were answered, and informed consent                            was obtained. Prior Anticoagulants: The patient has                            taken no previous anticoagulant or antiplatelet                            agents. ASA Grade Assessment: II - A patient with  mild systemic disease. After reviewing the risks                            and benefits, the patient was deemed in                            satisfactory condition to undergo the procedure.                           After obtaining informed consent, the colonoscope                            was passed under direct vision. Throughout the               procedure, the patient's blood pressure, pulse, and                            oxygen saturations were monitored continuously. The                            Colonoscope was introduced through the anus and                            advanced to the the cecum, identified by                            appendiceal orifice and ileocecal valve. The                            ileocecal valve, appendiceal orifice, and rectum                            were photographed. The quality of the bowel                            preparation was excellent. The colonoscopy was                            performed without difficulty. The patient tolerated                            the procedure well. The bowel preparation used was                            SUPREP via split dose instruction. Scope In: 8:08:30 AM Scope Out: 8:22:45 AM Scope Withdrawal Time: 0 hours 11 minutes 12 seconds  Total Procedure Duration: 0 hours 14 minutes 15 seconds  Findings:                 Four polyps were found in the sigmoid colon and                            transverse colon. The polyps were 2 to 3 mm in  size. These polyps were removed with a cold snare.                            Resection and retrieval were complete.                           The exam was otherwise without abnormality on                            direct and retroflexion views. Small internal                            hemorrhoids present. Complications:            No immediate complications. Estimated blood loss:                            None. Estimated Blood Loss:     Estimated blood loss: none. Impression:               - Four 2 to 3 mm polyps in the sigmoid colon and in                            the transverse colon, removed with a cold snare.                            Resected and retrieved.                           - The examination was otherwise normal on direct                            and  retroflexion views. Small internal hemorrhoids. Recommendation:           - Repeat colonoscopy in 3 - 5 years for                            surveillance.                           - Patient has a contact number available for                            emergencies. The signs and symptoms of potential                            delayed complications were discussed with the                            patient. Return to normal activities tomorrow.                            Written discharge instructions were provided to the                            patient.                           -  Resume previous diet.                           - Continue present medications.                           - Await pathology results. Docia Chuck. Henrene Pastor, MD 10/03/2019 8:29:16 AM This report has been signed electronically.

## 2019-10-07 ENCOUNTER — Ambulatory Visit (INDEPENDENT_AMBULATORY_CARE_PROVIDER_SITE_OTHER): Payer: 59 | Admitting: Adult Health

## 2019-10-07 ENCOUNTER — Other Ambulatory Visit: Payer: Self-pay

## 2019-10-07 ENCOUNTER — Encounter: Payer: Self-pay | Admitting: Adult Health

## 2019-10-07 ENCOUNTER — Telehealth: Payer: Self-pay | Admitting: *Deleted

## 2019-10-07 VITALS — BP 138/94 | HR 85 | Temp 97.0°F | Resp 16 | Wt 268.8 lb

## 2019-10-07 DIAGNOSIS — I10 Essential (primary) hypertension: Secondary | ICD-10-CM | POA: Diagnosis not present

## 2019-10-07 DIAGNOSIS — L237 Allergic contact dermatitis due to plants, except food: Secondary | ICD-10-CM | POA: Diagnosis not present

## 2019-10-07 DIAGNOSIS — Z86010 Personal history of colonic polyps: Secondary | ICD-10-CM | POA: Diagnosis not present

## 2019-10-07 DIAGNOSIS — Z860101 Personal history of adenomatous and serrated colon polyps: Secondary | ICD-10-CM

## 2019-10-07 HISTORY — DX: Personal history of colonic polyps: Z86.010

## 2019-10-07 HISTORY — DX: Personal history of adenomatous and serrated colon polyps: Z86.0101

## 2019-10-07 MED ORDER — TRIAMCINOLONE ACETONIDE 0.1 % EX OINT: 1.0000 "application " | TOPICAL_OINTMENT | Freq: Two times a day (BID) | CUTANEOUS | 1 refills | Status: DC

## 2019-10-07 NOTE — Telephone Encounter (Signed)
1. Have you developed a fever since your procedure? no  2.   Have you had an respiratory symptoms (SOB or cough) since your procedure? no  3.   Have you tested positive for COVID 19 since your procedure no  4.   Have you had any family members/close contacts diagnosed with the COVID 19 since your procedure?  no   If yes to any of these questions please route to Joylene John, RN and Erenest Rasher, RN  Follow up Call-  Call back number 10/03/2019  Post procedure Call Back phone  # [phone]  Permission to leave phone message Yes  Some recent data might be hidden     Patient questions:  Do you have a fever, pain , or abdominal swelling? No. Pain Score  0 *  Have you tolerated food without any problems? Yes.    Have you been able to return to your normal activities? Yes.    Do you have any questions about your discharge instructions: Diet   No. Medications  No. Follow up visit  No.  Do you have questions or concerns about your Care? No.  Actions: * If pain score is 4 or above: No action needed, pain <4.

## 2019-10-07 NOTE — Progress Notes (Signed)
Assessment and Plan:  Paul Warner was seen today for rash.  Diagnoses and all orders for this visit:  Contact dermatitis due to poison sumac  Can use benadryl, calamine lotion, contact precautions discussed.  -     triamcinolone ointment (KENALOG) 0.1 %; Apply 1 application topically 2 (two) times daily.  Essential hypertension Initiate medication: hyzaar 100/12.5 mg May not need bisoprolol, will hold off for now, evaluate need at follow up with combo pill  Monitor blood pressure at home; call if consistently over 130/80 Discussed DASH diet Advised to go to the ER if any CP, SOB, nausea, dizziness, severe HA, changes vision/speech, left arm numbness and tingling and jaw pain. Follow up as scheduled in 1 month   Further disposition pending results of labs. Discussed med's effects and SE's.   Over 15 minutes of exam, counseling, chart review, and critical decision making was performed.   Future Appointments  Date Time Provider [place]  11/04/2019  8:45 AM [name], NP [facility] None  02/10/2020  9:30 AM [name], MD [facility] None  08/03/2020  9:00 AM [name], MD [facility] None    ------------------------------------------------------------------------------------------------------------------   HPI BP (!) 138/94   Pulse 85   Temp (!) 97 F (36.1 C)   Resp 16   Wt 268 lb 12.8 oz (121.9 kg)   SpO2 97%   BMI 36.46 kg/m   59 y.o.male presents for evaluation of scant scatter mildly pruritic rash to bil hands, inner arms and abdomen following working in yard last week, 10 days ago, pulled some weeds - believes was poison ivy, typical reaction for him. Not spreading. Denies fever/chills, URI, HA, dizziness, GI sx, arthralgia/myalgia, fatigue.   Has been using calamine lotion, applying apple cider vinegar with some benefit.   No other family members with rash, no pets, no new detergents or products.   He admits has been off of BP meds following  recent colonoscopy. Encouraged to restart hyzaar.   Lab Results  Component Value Date   HGBA1C 8.7 (H) 07/23/2019     Past Medical History:  Diagnosis Date  . Allergy    seasonal  . Arthritis   . Blood transfusion 09/2011   2 units  . Diabetes mellitus without complication ([place])   . History of adenomatous polyp of colon 10/07/2019   Due 10/03/2022 per Dr. Henrene Pastor  . Hyperlipidemia   . Hypertension   . Renal disorder   . Stomach ulcer      Allergies  Allergen Reactions  . Codeine Nausea And Vomiting  . Nsaids Other (See Comments)    Bleeding ulcer 2013    Current Outpatient Medications on File Prior to Visit  Medication Sig  . allopurinol (ZYLOPRIM) 300 MG tablet Take 1 tablet Daily to prevent Gout  . aspirin 81 MG tablet Take 81 mg by mouth daily. Reported on 09/23/2015  . Blood Glucose Monitoring Suppl (FREESTYLE LITE) DEVI 1 Device by Does not apply route 3 (three) times daily.  . Cholecalciferol (VITAMIN D PO) Take 10,000 Units by mouth daily.  Marland Kitchen glucose blood (FREESTYLE LITE) test strip Use as instructed  . Lancets (FREESTYLE) lancets Use as instructed  . losartan-hydrochlorothiazide (HYZAAR) 100-12.5 MG tablet Take 1 tablet by mouth daily.  . meloxicam (MOBIC) 7.5 MG tablet TAKE 1 TABLET (7.5 MG TOTAL) BY MOUTH AS NEEDED  . metFORMIN (GLUCOPHAGE XR) 750 MG 24 hr tablet Take 1 tablet 2 x /day with a Meal for Diabetes  . Omega-3 Fatty Acids (FISH OIL) 1000  MG CAPS Take 2,000 mg by mouth daily.  Marland Kitchen OVER THE COUNTER MEDICATION Takes a MVI.  Marland Kitchen phentermine (ADIPEX-P) 37.5 MG tablet Take 1/2 to 1 tablet every morning for Dieting & Weight  Loss  . topiramate (TOPAMAX) 50 MG tablet Take 1/2 to 1 tablet 2 x /day at Suppertime & Bedtime for Dieting & Weight Loss  . Zinc 50 MG TABS Take 1 tablet Daily  . atorvastatin (LIPITOR) 40 MG tablet Take 1 tablet (40 mg total) by mouth daily at 6 PM.   No current facility-administered medications on file prior to visit.    ROS: all  negative except above.   Physical Exam:  BP (!) 138/94   Pulse 85   Temp (!) 97 F (36.1 C)   Resp 16   Wt 268 lb 12.8 oz (121.9 kg)   SpO2 97%   BMI 36.46 kg/m   General Appearance: Well nourished, in no apparent distress. Eyes: PERRLA, conjunctiva no swelling or erythema ENT/Mouth: Ext aud canals clear, TMs without erythema, bulging. No erythema, swelling, or exudate on post pharynx.  Tonsils not swollen or erythematous. Hearing normal.  Neck: Supple Respiratory: Respiratory effort normal, BS equal bilaterally without rales, rhonchi, wheezing or stridor.  Cardio: RRR with no MRGs. Brisk peripheral pulses without edema.  Abdomen: Soft, + BS.  Non tender, no guarding, rebound, hernias, masses. Lymphatics: Non tender without lymphadenopathy.  Musculoskeletal: no obvious deformity, normal gait.  Skin: Warm, dry; vesicular rash with erythematous base to bil hands, R inner arm, scant to lower abdomen Neuro: Cranial nerves intact. Normal muscle tone, Psych: Awake and oriented X 3, normal affect, Insight and Judgment appropriate.     Izora Ribas, NP 5:11 PM [facility]

## 2019-10-07 NOTE — Patient Instructions (Addendum)
Goals    . Blood Pressure < 130/80    . Fasting Blood Glucose <130     Check fasting glucose daily and keep a log to bring to each appointment    . HEMOGLOBIN A1C < 7.0    . LDL CALC < 70    . Weight (lb) < 250 lb (113.4 kg)           Poison Ivy Dermatitis Poison ivy dermatitis is inflammation of the skin that is caused by chemicals in the leaves of the poison ivy plant. The skin reaction often involves redness, swelling, blisters, and extreme itching. What are the causes? This condition is caused by a chemical (urushiol) found in the sap of the poison ivy plant. This chemical is sticky and can be easily spread to people, animals, and objects. You can get poison ivy dermatitis by:  Having direct contact with a poison ivy plant.  Touching animals, other people, or objects that have come in contact with poison ivy and have the chemical on them. What increases the risk? This condition is more likely to develop in people who:  Are outdoors often in wooded or [place] areas.  Go outdoors without wearing protective clothing, such as closed shoes, long pants, and a long-sleeved shirt. What are the signs or symptoms? Symptoms of this condition include:  Redness of the skin.  Extreme itching.  A rash that often includes bumps and blisters. The rash usually appears 48 hours after exposure, if you have been exposed before. If this is the first time you have been exposed, the rash may not appear until a week after exposure.  Swelling. This may occur if the reaction is more severe. Symptoms usually last for 1-2 weeks. However, the first time you develop this condition, symptoms may last 3-4 weeks. How is this diagnosed? This condition may be diagnosed based on your symptoms and a physical exam. Your health care provider may also ask you about any recent outdoor activity. How is this treated? Treatment for this condition will vary depending on how severe it is. Treatment may  include:  Hydrocortisone cream or calamine lotion to relieve itching.  Oatmeal baths to soothe the skin.  Medicines, such as over-the-counter antihistamine tablets.  Oral steroid medicine, for more severe reactions. Follow these instructions at home: Medicines  Take or apply over-the-counter and prescription medicines only as told by your health care provider.  Use hydrocortisone cream or calamine lotion as needed to soothe the skin and relieve itching. General instructions  Do not scratch or rub your skin.  Apply a cold, wet cloth (cold compress) to the affected areas or take baths in cool water. This will help with itching. Avoid hot baths and showers.  Take oatmeal baths as needed. Use colloidal oatmeal. You can get this at your local pharmacy or grocery store. Follow the instructions on the packaging.  While you have the rash, wash clothes right after you wear them.  Keep all follow-up visits as told by your health care provider. This is important. How is this prevented?   Learn to identify the poison ivy plant and avoid contact with the plant. This plant can be recognized by the number of leaves. Generally, poison ivy has three leaves with flowering branches on a single stem. The leaves are typically glossy, and they have jagged edges that come to a point at the front.  If you have been exposed to poison ivy, thoroughly wash with soap and water right away. You have  about 30 minutes to remove the plant resin before it will cause the rash. Be sure to wash under your fingernails, because any plant resin there will continue to spread the rash.  When hiking or camping, wear clothes that will help you to avoid exposure on the skin. This includes long pants, a long-sleeved shirt, tall socks, and hiking boots. You can also apply preventive lotion to your skin to help limit exposure.  If you suspect that your clothes or outdoor gear came in contact with poison ivy, rinse them off  outside with a garden hose before you bring them inside your house.  When doing yard work or gardening, wear gloves, long sleeves, long pants, and boots. Wash your garden tools and gloves if they come in contact with poison ivy.  If you suspect that your pet has come into contact with poison ivy, wash him or her with pet shampoo and water. Make sure to wear gloves while washing your pet. Contact a health care provider if you have:  Open sores in the rash area.  More redness, swelling, or pain in the affected area.  Redness that spreads beyond the rash area.  Fluid, blood, or pus coming from the affected area.  A fever.  A rash over a large area of your body.  A rash on your eyes, mouth, or genitals.  A rash that does not improve after a few weeks. Get help right away if:  Your face swells or your eyes swell shut.  You have trouble breathing.  You have trouble swallowing. These symptoms may represent a serious problem that is an emergency. Do not wait to see if the symptoms will go away. Get medical help right away. Call your local emergency services (911 in the [place] Do not drive yourself to the hospital. Summary  Poison ivy dermatitis is inflammation of the skin that is caused by chemicals in the leaves of the poison ivy plant.  Symptoms of this condition include redness, itching, a rash, and swelling.  Do not scratch or rub your skin.  Take or apply over-the-counter and prescription medicines only as told by your health care provider. This information is not intended to replace advice given to you by your health care provider. Make sure you discuss any questions you have with your health care provider. Document Revised: 09/07/2018 Document Reviewed: 05/11/2018 Elsevier Patient Education  2020 [employer].

## 2019-10-08 ENCOUNTER — Encounter: Payer: Self-pay | Admitting: Internal Medicine

## 2019-10-11 ENCOUNTER — Encounter: Payer: Self-pay | Admitting: Adult Health

## 2019-11-01 NOTE — Progress Notes (Deleted)
FOLLOW UP  Assessment and Plan:   Hypertension Elevated; off of meds x 2 weeks, emphasized need for adherence and risk of inconsistent treatment- resent in medications *** Monitor blood pressure at home; patient to call if consistently greater than 130/80 Continue DASH diet.   Reminder to go to the ER if any CP, SOB, nausea, dizziness, severe HA, changes vision/speech, left arm numbness and tingling and jaw pain.  Cholesterol Currently at goal; continue lipitor 40 mg Continue low cholesterol diet and exercise.  Check lipid panel.   Diabetes with diabetic chronic kidney disease Continue medication: start back on metformin will do 750 BID Sent in glucometer and supplies to start checking fasting glucose daily  Continue diet and exercise.  Perform daily foot/skin check, notify office of any concerning changes.  Check A1C  Morbid Obesity with co morbidities Long discussion about weight loss, diet, and exercise Recommended diet heavy in fruits and veggies and low in animal meats, cheeses, and dairy products, appropriate calorie intake Discussed ideal weight for height *** Patient on phentermine/topamax with benefit and no SE, taking drug breaks; continue close follow up. Return in ***  Will follow up in 3 months  Vitamin D Def At goal, continue supplementation Goal of 60-100 Defer checking vitamin D level  Gout Patient preference is currently off allopurinol, no recent flares since cutting out soda *** d/c allopurinol if not taking  Diet discussed Check uric acid as needed  Testosterone def ***  Continue diet and meds as discussed. Further disposition pending results of labs. Discussed med's effects and SE's.   Over 30 minutes of exam, counseling, chart review, and critical decision making was performed.   Future Appointments  Date Time Provider [place]  11/04/2019  8:45 AM [name], NP [facility] None  02/10/2020  9:30 AM [name], MD [facility]  None  08/03/2020  9:00 AM [name], MD [facility] None    ----------------------------------------------------------------------------------------------------------------------  HPI 59 y.o. male  presents for 3 month follow up on hypertension, cholesterol, diabetes with CKD, morbid obesity, gout and vitamin D deficiency.   he is prescribed phentermine for weight loss.  While on the medication they have lost {NUMBERS 0-12:18577} lbs since last visit. They deny palpitations, anxiety, trouble sleeping, elevated BP.   Typical breakfast: Typical lunch:  Typical dinner: Exercise:  Water intake:   BMI is There is no height or weight on file to calculate BMI., he is not working on diet right now. He has moved and is switching jobs so is under a lot of stress. He has packed his medications up and admits to not taking them consistently. Wt Readings from Last 3 Encounters:  10/07/19 268 lb 12.8 oz (121.9 kg)  10/03/19 264 lb (119.7 kg)  09/16/19 264 lb (119.7 kg)   He has not been checking BP at home for the last few weeks, has been off of BP meds, today their BP is    He does workout. He denies chest pain, shortness of breath, dizziness.   He has not been working on diet and exercise for T2 diabetes CKD stage 2 he is on losartan Hyperlipidemia lipitor 40 mg metformin 750 mg BID *** denies foot ulcerations, hyperglycemia, increased appetite, nausea, paresthesia of the feet, polydipsia, polyuria, visual disturbances, vomiting and weight loss.  He does not check sugars.   Last A1C in the office was:  Lab Results  Component Value Date   HGBA1C 8.7 (H) 07/23/2019   He has CKD borderline stage 1/2 associated with  T2DM monitored at this office:  Lab Results  Component Value Date   GFRNONAA 94 07/23/2019   Lab Results  Component Value Date   CHOL 103 07/23/2019   HDL 35 (L) 07/23/2019   LDLCALC 39 07/23/2019   TRIG 230 (H) 07/23/2019   CHOLHDL 2.9 07/23/2019   Patient is on  Vitamin D supplement and at goal at last check:   Lab Results  Component Value Date   VD25OH 66 07/23/2019     Patient is prescribed allopurinol for gout but doesn't take *** and does not report a recent flare.  Lab Results  Component Value Date   LABURIC 7.3 07/23/2019     He has a history of testosterone deficiency and is on zinc supplement *** testosterone replacement. He states that the testosterone helps with his energy, libido, muscle mass. Denies symptoms of elevated estrogen or dihydrotestosterone. *** Lab Results  Component Value Date   TESTOSTERONE 185 (L) 07/23/2019      Current Medications:  Current Outpatient Medications on File Prior to Visit  Medication Sig  . allopurinol (ZYLOPRIM) 300 MG tablet Take 1 tablet Daily to prevent Gout  . aspirin 81 MG tablet Take 81 mg by mouth daily. Reported on 09/23/2015  . atorvastatin (LIPITOR) 40 MG tablet Take 1 tablet (40 mg total) by mouth daily at 6 PM.  . Blood Glucose Monitoring Suppl (FREESTYLE LITE) DEVI 1 Device by Does not apply route 3 (three) times daily.  . Cholecalciferol (VITAMIN D PO) Take 10,000 Units by mouth daily.  Marland Kitchen glucose blood (FREESTYLE LITE) test strip Use as instructed  . Lancets (FREESTYLE) lancets Use as instructed  . losartan-hydrochlorothiazide (HYZAAR) 100-12.5 MG tablet Take 1 tablet by mouth daily.  . meloxicam (MOBIC) 7.5 MG tablet TAKE 1 TABLET (7.5 MG TOTAL) BY MOUTH AS NEEDED  . metFORMIN (GLUCOPHAGE XR) 750 MG 24 hr tablet Take 1 tablet 2 x /day with a Meal for Diabetes  . Omega-3 Fatty Acids (FISH OIL) 1000 MG CAPS Take 2,000 mg by mouth daily.  Marland Kitchen OVER THE COUNTER MEDICATION Takes a MVI.  Marland Kitchen phentermine (ADIPEX-P) 37.5 MG tablet Take 1/2 to 1 tablet every morning for Dieting & Weight  Loss  . topiramate (TOPAMAX) 50 MG tablet Take 1/2 to 1 tablet 2 x /day at Suppertime & Bedtime for Dieting & Weight Loss  . triamcinolone ointment (KENALOG) 0.1 % Apply 1 application topically 2 (two) times  daily.  . Zinc 50 MG TABS Take 1 tablet Daily   No current facility-administered medications on file prior to visit.     Allergies:  Allergies  Allergen Reactions  . Codeine Nausea And Vomiting  . Nsaids Other (See Comments)    Bleeding ulcer 2013     Medical History:  Past Medical History:  Diagnosis Date  . Allergy    seasonal  . Arthritis   . Blood transfusion 09/2011   2 units  . Diabetes mellitus without complication ([place])   . History of adenomatous polyp of colon 10/07/2019   Due 10/03/2022 per Dr. Henrene Pastor  . Hyperlipidemia   . Hypertension   . Renal disorder   . Stomach ulcer    Family history- Reviewed and unchanged Social history- Reviewed and unchanged   Review of Systems:  Review of Systems  Constitutional: Negative for malaise/fatigue and weight loss.  HENT: Negative for hearing loss and tinnitus.   Eyes: Negative for blurred vision and double vision.  Respiratory: Negative for cough, shortness of breath and wheezing.  Cardiovascular: Negative for chest pain, palpitations, orthopnea, claudication and leg swelling.  Gastrointestinal: Negative for abdominal pain, blood in stool, constipation, diarrhea, heartburn, melena, nausea and vomiting.  Genitourinary: Negative.   Musculoskeletal: Negative for joint pain and myalgias.  Skin: Negative for rash.  Neurological: Negative for dizziness, tingling, sensory change, weakness and headaches.  Endo/Heme/Allergies: Negative for polydipsia.  Psychiatric/Behavioral: Negative.   All other systems reviewed and are negative.    Physical Exam: There were no vitals taken for this visit. Wt Readings from Last 3 Encounters:  10/07/19 268 lb 12.8 oz (121.9 kg)  10/03/19 264 lb (119.7 kg)  09/16/19 264 lb (119.7 kg)   General Appearance: Well nourished, in no apparent distress. Eyes: PERRLA, EOMs, conjunctiva no swelling or erythema Sinuses: No Frontal/maxillary tenderness ENT/Mouth: Ext aud canals clear, TMs without  erythema, bulging. No erythema, swelling, or exudate on post pharynx.  Tonsils not swollen or erythematous. Hearing normal.  Neck: Supple, thyroid normal.  Respiratory: Respiratory effort normal, BS equal bilaterally without rales, rhonchi, wheezing or stridor.  Cardio: RRR with no MRGs. Brisk peripheral pulses without edema.  Abdomen: Soft, + BS.  Non tender, no guarding, rebound, hernias, masses. Lymphatics: Non tender without lymphadenopathy.  Musculoskeletal: Full ROM, 5/5 strength, Normal gait Skin: Warm, dry without rashes, lesions, ecchymosis.  Neuro: Cranial nerves intact. No cerebellar symptoms.  Psych: Awake and oriented X 3, normal affect, Insight and Judgment appropriate.    Izora Ribas, NP 9:04 AM [facility]

## 2019-11-04 ENCOUNTER — Ambulatory Visit: Payer: 59 | Admitting: Adult Health

## 2019-11-14 NOTE — Progress Notes (Deleted)
FOLLOW UP  Assessment and Plan:   Hypertension Elevated; off of meds x 2 weeks, emphasized need for adherence and risk of inconsistent treatment- resent in medications *** Monitor blood pressure at home; patient to call if consistently greater than 130/80 Continue DASH diet.   Reminder to go to the ER if any CP, SOB, nausea, dizziness, severe HA, changes vision/speech, left arm numbness and tingling and jaw pain.  Cholesterol Currently at goal; continue lipitor 40 mg Continue low cholesterol diet and exercise.  Check lipid panel.   Diabetes with diabetic chronic kidney disease Continue medication: start back on metformin will do 750 BID Sent in glucometer and supplies to start checking fasting glucose daily  Continue diet and exercise.  Perform daily foot/skin check, notify office of any concerning changes.  Check A1C  Morbid Obesity with co morbidities Long discussion about weight loss, diet, and exercise Recommended diet heavy in fruits and veggies and low in animal meats, cheeses, and dairy products, appropriate calorie intake Discussed ideal weight for height *** Patient on phentermine/topamax with benefit and no SE, taking drug breaks; continue close follow up. Return in ***  Will follow up in 3 months  Vitamin D Def At goal, continue supplementation Goal of 60-100 Defer checking vitamin D level  Gout Patient preference is currently off allopurinol, no recent flares since cutting out soda *** d/c allopurinol if not taking  Diet discussed Check uric acid as needed  Testosterone def ***  Continue diet and meds as discussed. Further disposition pending results of labs. Discussed med's effects and SE's.   Over 30 minutes of exam, counseling, chart review, and critical decision making was performed.   Future Appointments  Date Time Provider [name]  11/19/2019  8:45 AM [name], NP [facility] None  02/10/2020  9:30 AM [name], MD [facility]  None  08/03/2020  9:00 AM [name], MD [facility] None    ----------------------------------------------------------------------------------------------------------------------  HPI 59 y.o. male  presents for 3 month follow up on hypertension, cholesterol, diabetes with CKD, morbid obesity, gout and vitamin D deficiency.   he is prescribed phentermine for weight loss.  While on the medication they have lost {NUMBERS 0-12:18577} lbs since last visit. They deny palpitations, anxiety, trouble sleeping, elevated BP.   Typical breakfast: Typical lunch:  Typical dinner: Exercise:  Water intake:   BMI is There is no height or weight on file to calculate BMI., he is not working on diet right now. He has moved and is switching jobs so is under a lot of stress. He has packed his medications up and admits to not taking them consistently. Wt Readings from Last 3 Encounters:  10/07/19 268 lb 12.8 oz (121.9 kg)  10/03/19 264 lb (119.7 kg)  09/16/19 264 lb (119.7 kg)   He has not been checking BP at home for the last few weeks, has been off of BP meds, today their BP is    He does workout. He denies chest pain, shortness of breath, dizziness.   He has not been working on diet and exercise for T2 diabetes CKD stage 2 he is on losartan Hyperlipidemia lipitor 40 mg, at LDL goal  metformin 750 mg BID *** denies foot ulcerations, hyperglycemia, increased appetite, nausea, paresthesia of the feet, polydipsia, polyuria, visual disturbances, vomiting and weight loss.  He does not check sugars.   Last A1C in the office was:  Lab Results  Component Value Date   HGBA1C 8.7 (H) 07/23/2019   He has CKD borderline  stage 1/2 associated with T2DM monitored at this office:  Lab Results  Component Value Date   GFRNONAA 94 07/23/2019   Lab Results  Component Value Date   CHOL 103 07/23/2019   HDL 35 (L) 07/23/2019   LDLCALC 39 07/23/2019   TRIG 230 (H) 07/23/2019   CHOLHDL 2.9 07/23/2019    Patient is on Vitamin D supplement and at goal at last check:   Lab Results  Component Value Date   VD25OH 66 07/23/2019     Patient is prescribed allopurinol for gout but doesn't take *** and does not report a recent flare.  Lab Results  Component Value Date   LABURIC 7.3 07/23/2019     He has a history of testosterone deficiency and is on zinc supplement *** testosterone replacement. He states that the testosterone helps with his energy, libido, muscle mass. Denies symptoms of elevated estrogen or dihydrotestosterone. *** Lab Results  Component Value Date   TESTOSTERONE 185 (L) 07/23/2019      Current Medications:  Current Outpatient Medications on File Prior to Visit  Medication Sig  . allopurinol (ZYLOPRIM) 300 MG tablet Take 1 tablet Daily to prevent Gout  . aspirin 81 MG tablet Take 81 mg by mouth daily. Reported on 09/23/2015  . atorvastatin (LIPITOR) 40 MG tablet Take 1 tablet (40 mg total) by mouth daily at 6 PM.  . Blood Glucose Monitoring Suppl (FREESTYLE LITE) DEVI 1 Device by Does not apply route 3 (three) times daily.  . Cholecalciferol (VITAMIN D PO) Take 10,000 Units by mouth daily.  Marland Kitchen glucose blood (FREESTYLE LITE) test strip Use as instructed  . Lancets (FREESTYLE) lancets Use as instructed  . losartan-hydrochlorothiazide (HYZAAR) 100-12.5 MG tablet Take 1 tablet by mouth daily.  . meloxicam (MOBIC) 7.5 MG tablet TAKE 1 TABLET (7.5 MG TOTAL) BY MOUTH AS NEEDED  . metFORMIN (GLUCOPHAGE XR) 750 MG 24 hr tablet Take 1 tablet 2 x /day with a Meal for Diabetes  . Omega-3 Fatty Acids (FISH OIL) 1000 MG CAPS Take 2,000 mg by mouth daily.  Marland Kitchen OVER THE COUNTER MEDICATION Takes a MVI.  Marland Kitchen phentermine (ADIPEX-P) 37.5 MG tablet Take 1/2 to 1 tablet every morning for Dieting & Weight  Loss  . topiramate (TOPAMAX) 50 MG tablet Take 1/2 to 1 tablet 2 x /day at Suppertime & Bedtime for Dieting & Weight Loss  . triamcinolone ointment (KENALOG) 0.1 % Apply 1 application  topically 2 (two) times daily.  . Zinc 50 MG TABS Take 1 tablet Daily   No current facility-administered medications on file prior to visit.     Allergies:  Allergies  Allergen Reactions  . Codeine Nausea And Vomiting  . Nsaids Other (See Comments)    Bleeding ulcer 2013     Medical History:  Past Medical History:  Diagnosis Date  . Allergy    seasonal  . Arthritis   . Blood transfusion 09/2011   2 units  . Diabetes mellitus without complication ([place])   . History of adenomatous polyp of colon 10/07/2019   Due 10/03/2022 per Dr. Henrene Pastor  . Hyperlipidemia   . Hypertension   . Renal disorder   . Stomach ulcer    Family history- Reviewed and unchanged Social history- Reviewed and unchanged   Review of Systems:  Review of Systems  Constitutional: Negative for malaise/fatigue and weight loss.  HENT: Negative for hearing loss and tinnitus.   Eyes: Negative for blurred vision and double vision.  Respiratory: Negative for cough, shortness of  breath and wheezing.   Cardiovascular: Negative for chest pain, palpitations, orthopnea, claudication and leg swelling.  Gastrointestinal: Negative for abdominal pain, blood in stool, constipation, diarrhea, heartburn, melena, nausea and vomiting.  Genitourinary: Negative.   Musculoskeletal: Negative for joint pain and myalgias.  Skin: Negative for rash.  Neurological: Negative for dizziness, tingling, sensory change, weakness and headaches.  Endo/Heme/Allergies: Negative for polydipsia.  Psychiatric/Behavioral: Negative.   All other systems reviewed and are negative.    Physical Exam: There were no vitals taken for this visit. Wt Readings from Last 3 Encounters:  10/07/19 268 lb 12.8 oz (121.9 kg)  10/03/19 264 lb (119.7 kg)  09/16/19 264 lb (119.7 kg)   General Appearance: Well nourished, in no apparent distress. Eyes: PERRLA, EOMs, conjunctiva no swelling or erythema Sinuses: No Frontal/maxillary tenderness ENT/Mouth: Ext aud  canals clear, TMs without erythema, bulging. No erythema, swelling, or exudate on post pharynx.  Tonsils not swollen or erythematous. Hearing normal.  Neck: Supple, thyroid normal.  Respiratory: Respiratory effort normal, BS equal bilaterally without rales, rhonchi, wheezing or stridor.  Cardio: RRR with no MRGs. Brisk peripheral pulses without edema.  Abdomen: Soft, + BS.  Non tender, no guarding, rebound, hernias, masses. Lymphatics: Non tender without lymphadenopathy.  Musculoskeletal: Full ROM, 5/5 strength, Normal gait Skin: Warm, dry without rashes, lesions, ecchymosis.  Neuro: Cranial nerves intact. No cerebellar symptoms.  Psych: Awake and oriented X 3, normal affect, Insight and Judgment appropriate.    Izora Ribas, NP 8:55 AM [facility]

## 2019-11-15 ENCOUNTER — Other Ambulatory Visit: Payer: Self-pay | Admitting: Internal Medicine

## 2019-11-15 MED ORDER — CIPROFLOXACIN HCL 500 MG PO TABS: ORAL_TABLET | ORAL | 0 refills | Status: DC

## 2019-11-16 ENCOUNTER — Other Ambulatory Visit: Payer: Self-pay | Admitting: Internal Medicine

## 2019-11-16 MED ORDER — CIPROFLOXACIN HCL 500 MG PO TABS: ORAL_TABLET | ORAL | 0 refills | Status: DC

## 2019-11-19 ENCOUNTER — Ambulatory Visit: Payer: Commercial Managed Care - PPO | Admitting: Adult Health

## 2019-11-27 NOTE — Progress Notes (Signed)
FOLLOW UP  Assessment and Plan:   Hypertension Elevated; off of meds, start using pill box for consistency Monitor blood pressure at home; patient to call if consistently greater than 130/80 Continue DASH diet.   Reminder to go to the ER if any CP, SOB, nausea, dizziness, severe HA, changes vision/speech, left arm numbness and tingling and jaw pain. Follow up recheck 1 month   Cholesterol Currently at goal; continue lipitor 40 mg Continue low cholesterol diet and exercise.  Check lipid panel.   Diabetes with diabetic chronic kidney disease Continue medication: start back on metformin will do 750 BID Also will start ozempic - sample given, 0.25 mg weekly x 4 weeks, then if tolerating will send in 0.5 mg weekly  Sent in glucometer and supplies to start checking fasting glucose daily  Continue diet and exercise.  Perform daily foot/skin check, notify office of any concerning changes.  Check A1C  Morbid Obesity with co morbidities Long discussion about weight loss, diet, and exercise Recommended diet heavy in fruits and veggies and low in animal meats, cheeses, and dairy products, appropriate calorie intake Discussed ideal weight for height Patient on phentermine/topamax with benefit and no SE, currently taking drug break; continue close follow up.  Will try adding ozempic,  Add exercise Will follow up in 3 months  Vitamin D Def At goal, continue supplementation Goal of 60-100 Defer checking vitamin D level  Gout He reports improved with lifestyle, not taking allopurinol  Diet discussed Check uric acid as needed  Continue diet and meds as discussed. Further disposition pending results of labs. Discussed med's effects and SE's.   Over 30 minutes of exam, counseling, chart review, and critical decision making was performed.   Future Appointments  Date Time Provider [place]  02/10/2020  9:30 AM [name], MD [facility] None  08/03/2020  9:00 AM [name], MD [facility] None    ----------------------------------------------------------------------------------------------------------------------  HPI 59 y.o. male  presents for 3 month follow up on hypertension, cholesterol, diabetes with CKD, morbid obesity, gout and vitamin D deficiency.   he is prescribed phentermine for weight loss, taking a drug break  While on the medication they have lost 8 lbs since last visit. They deny palpitations, anxiety, trouble sleeping, elevated BP.   BMI is Body mass index is 35.26 kg/m., he is working on diet right now, but knows he needs to do better, needs to move more, does eat a whole lot but does admit to a lot of meat  Wt Readings from Last 3 Encounters:  11/29/19 260 lb (117.9 kg)  10/07/19 268 lb 12.8 oz (121.9 kg)  10/03/19 264 lb (119.7 kg)   He reports when he takes meds BP at home is good (can't recall numbers), admits hasn't taken BP medication, today their BP is BP: (!) 150/98 Has picked up pill dispenser to help with compliance/consistency   He does not workout. He denies chest pain, shortness of breath, dizziness.   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  He does not check sugars.  Does have a machine , plans to start.  Last A1C in the office was:  Lab Results  Component Value Date   HGBA1C  8.7 (H) 07/23/2019   He has CKD borderline stage 1/2 associated with T2DM monitored at this office:  Lab Results  Component Value Date   GFRNONAA 94 07/23/2019   Lab Results  Component Value Date   CHOL 103 07/23/2019   HDL 35 (L) 07/23/2019   LDLCALC 39 07/23/2019   TRIG 230 (H) 07/23/2019   CHOLHDL 2.9  07/23/2019   Patient is on Vitamin D supplement and at goal at last check:   Lab Results  Component Value Date   VD25OH 66 07/23/2019     Patient is prescribed allopurinol for gout but doesn't take, and does not report a recent flare.  Lab Results  Component Value Date   LABURIC 7.3 07/23/2019   He has a history of testosterone deficiency and is on zinc supplement.  Lab Results  Component Value Date   TESTOSTERONE 185 (L) 07/23/2019   Hx of LFT elevations, did have negative hepatitis panel in 2015, recent normal iron in 07/2019, never had [place]  Lab Results  Component Value Date   ALT 51 (H) 07/23/2019   AST 40 (H) 07/23/2019   ALKPHOS 52 10/14/2016   BILITOT 0.8 07/23/2019      Current Medications:  Current Outpatient Medications on File Prior to Visit  Medication Sig  . allopurinol (ZYLOPRIM) 300 MG tablet Take 1 tablet Daily to prevent Gout  . aspirin 81 MG tablet Take 81 mg by mouth daily. Reported on 09/23/2015  . atorvastatin (LIPITOR) 40 MG tablet Take 1 tablet (40 mg total) by mouth daily at 6 PM.  . Blood Glucose Monitoring Suppl (FREESTYLE LITE) DEVI 1 Device by Does not apply route 3 (three) times daily.  . Cholecalciferol (VITAMIN D PO) Take 10,000 Units by mouth daily.  Marland Kitchen glucose blood (FREESTYLE LITE) test strip Use as instructed  . Lancets (FREESTYLE) lancets Use as instructed  . losartan-hydrochlorothiazide (HYZAAR) 100-12.5 MG tablet Take 1 tablet by mouth daily.  . meloxicam (MOBIC) 7.5 MG tablet TAKE 1 TABLET (7.5 MG TOTAL) BY MOUTH AS NEEDED  . metFORMIN (GLUCOPHAGE XR) 750 MG 24 hr tablet Take 1 tablet 2 x /day with a Meal for Diabetes  . Omega-3 Fatty Acids (FISH OIL) 1000 MG CAPS Take 2,000 mg by mouth daily.  . Zinc 50 MG TABS Take 1 tablet Daily  . OVER THE COUNTER MEDICATION Takes a MVI. (Patient not taking: Reported on 11/29/2019)  . phentermine (ADIPEX-P) 37.5 MG tablet Take 1/2 to 1 tablet every morning for Dieting & Weight  Loss (Patient not taking:  Reported on 11/29/2019)  . topiramate (TOPAMAX) 50 MG tablet Take 1/2 to 1 tablet 2 x /day at Suppertime & Bedtime for Dieting & Weight Loss (Patient not taking: Reported on 11/29/2019)   No current facility-administered medications on file prior to visit.     Allergies:  Allergies  Allergen Reactions  . Codeine Nausea And Vomiting  . Nsaids Other (See Comments)    Bleeding ulcer 2013     Medical History:  Past Medical History:  Diagnosis Date  . Allergy    seasonal  . Arthritis   . Blood transfusion 09/2011   2 units  . Diabetes mellitus without complication ([place])   . History of adenomatous polyp of colon 10/07/2019   Due 10/03/2022 per Dr. Henrene Pastor  . Hyperlipidemia   . Hypertension   . Renal disorder   . Stomach ulcer    Family history- Reviewed and unchanged Social history- Reviewed and unchanged   Review of Systems:  Review of Systems  Constitutional: Negative for malaise/fatigue and weight loss.  HENT: Negative for hearing loss and tinnitus.   Eyes: Negative for blurred vision and double vision.  Respiratory: Negative for cough, shortness of breath and wheezing.   Cardiovascular: Negative for chest pain, palpitations, orthopnea, claudication and leg swelling.  Gastrointestinal: Negative for abdominal pain, blood in stool, constipation, diarrhea, heartburn, melena, nausea and vomiting.  Genitourinary: Negative.   Musculoskeletal: Negative for joint pain and myalgias.  Skin: Negative for rash.  Neurological: Negative for dizziness, tingling, sensory change, weakness and headaches.  Endo/Heme/Allergies: Negative for polydipsia.  Psychiatric/Behavioral: Negative.   All other systems reviewed and are negative.    Physical Exam: BP (!) 150/98   Pulse 80   Temp (!) 97.3 F (36.3 C)   Ht 6' (1.829 m)   Wt 260 lb (117.9 kg)   SpO2 99%   BMI 35.26 kg/m  Wt Readings from Last 3 Encounters:  11/29/19 260 lb (117.9 kg)  10/07/19 268 lb 12.8 oz (121.9 kg)  10/03/19 264  lb (119.7 kg)   General Appearance: Well nourished, in no apparent distress. Eyes: PERRLA, EOMs, conjunctiva no swelling or erythema Sinuses: No Frontal/maxillary tenderness ENT/Mouth: Ext aud canals clear, TMs without erythema, bulging. No erythema, swelling, or exudate on post pharynx.  Tonsils not swollen or erythematous. Hearing normal.  Neck: Supple, thyroid normal.  Respiratory: Respiratory effort normal, BS equal bilaterally without rales, rhonchi, wheezing or stridor.  Cardio: RRR with no MRGs. Brisk peripheral pulses without edema.  Abdomen: Soft, + BS.  Non tender, no guarding, rebound, hernias, masses. Lymphatics: Non tender without lymphadenopathy.  Musculoskeletal: Full ROM, 5/5 strength, Normal gait Skin: Warm, dry without rashes, lesions, ecchymosis.  Neuro: Cranial nerves intact. No cerebellar symptoms.  Psych: Awake and oriented X 3, normal affect, Insight and Judgment appropriate.    Paul Ribas, NP 10:11 AM [facility]

## 2019-11-29 ENCOUNTER — Encounter: Payer: Self-pay | Admitting: Adult Health

## 2019-11-29 ENCOUNTER — Other Ambulatory Visit: Payer: Self-pay

## 2019-11-29 ENCOUNTER — Ambulatory Visit (INDEPENDENT_AMBULATORY_CARE_PROVIDER_SITE_OTHER): Payer: 59 | Admitting: Adult Health

## 2019-11-29 VITALS — BP 150/98 | HR 80 | Temp 97.3°F | Ht 72.0 in | Wt 260.0 lb

## 2019-11-29 DIAGNOSIS — E785 Hyperlipidemia, unspecified: Secondary | ICD-10-CM

## 2019-11-29 DIAGNOSIS — E119 Type 2 diabetes mellitus without complications: Secondary | ICD-10-CM | POA: Diagnosis not present

## 2019-11-29 DIAGNOSIS — E1169 Type 2 diabetes mellitus with other specified complication: Secondary | ICD-10-CM

## 2019-11-29 DIAGNOSIS — E1122 Type 2 diabetes mellitus with diabetic chronic kidney disease: Secondary | ICD-10-CM

## 2019-11-29 DIAGNOSIS — R7989 Other specified abnormal findings of blood chemistry: Secondary | ICD-10-CM

## 2019-11-29 DIAGNOSIS — I10 Essential (primary) hypertension: Secondary | ICD-10-CM

## 2019-11-29 DIAGNOSIS — N182 Chronic kidney disease, stage 2 (mild): Secondary | ICD-10-CM

## 2019-11-29 DIAGNOSIS — K219 Gastro-esophageal reflux disease without esophagitis: Secondary | ICD-10-CM

## 2019-11-29 DIAGNOSIS — M1000 Idiopathic gout, unspecified site: Secondary | ICD-10-CM

## 2019-11-29 DIAGNOSIS — E559 Vitamin D deficiency, unspecified: Secondary | ICD-10-CM

## 2019-11-29 DIAGNOSIS — Z79899 Other long term (current) drug therapy: Secondary | ICD-10-CM

## 2019-11-29 MED ORDER — OZEMPIC (0.25 OR 0.5 MG/DOSE) 2 MG/1.5ML ~~LOC~~ SOPN: 0.2500 mg | PEN_INJECTOR | SUBCUTANEOUS | 0 refills | Status: DC

## 2019-11-29 MED ORDER — METFORMIN HCL ER (MOD) 1000 MG PO TB24: ORAL_TABLET | ORAL | 1 refills | Status: DC

## 2019-11-29 NOTE — Patient Instructions (Addendum)
Goals    . Blood Pressure < 130/80    . Fasting Blood Glucose <130     Check fasting glucose daily and keep a log to bring to each appointment    . HEMOGLOBIN A1C < 7.0    . LDL CALC < 70    . Weight (lb) < 250 lb (113.4 kg)       Set a phone reminder to do once weekly ozempic injections  Look up video online for how to inject - once weekly in abdomen fatty tissue  Semaglutide injection solution What is this medicine? SEMAGLUTIDE (Sem a GLOO tide) is used to improve blood sugar control in adults with type 2 diabetes. This medicine may be used with other diabetes medicines. This drug may also reduce the risk of heart attack or stroke if you have type 2 diabetes and risk factors for heart disease. This medicine may be used for other purposes; ask your health care provider or pharmacist if you have questions. COMMON BRAND NAME(S): OZEMPIC What should I tell my health care provider before I take this medicine? They need to know if you have any of these conditions:  endocrine tumors (MEN 2) or if someone in your family had these tumors  eye disease, vision problems  history of pancreatitis  kidney disease  stomach problems  thyroid cancer or if someone in your family had thyroid cancer  an unusual or allergic reaction to semaglutide, other medicines, foods, dyes, or preservatives  pregnant or trying to get pregnant  breast-feeding How should I use this medicine? This medicine is for injection under the skin of your upper leg (thigh), stomach area, or upper arm. It is given once every week (every 7 days). You will be taught how to prepare and give this medicine. Use exactly as directed. Take your medicine at regular intervals. Do not take it more often than directed. If you use this medicine with insulin, you should inject this medicine and the insulin separately. Do not mix them together. Do not give the injections right next to each other. Change (rotate) injection sites with  each injection. It is important that you put your used needles and syringes in a special sharps container. Do not put them in a trash can. If you do not have a sharps container, call your pharmacist or healthcare provider to get one. A special MedGuide will be given to you by the pharmacist with each prescription and refill. Be sure to read this information carefully each time. This drug comes with INSTRUCTIONS FOR USE. Ask your pharmacist for directions on how to use this drug. Read the information carefully. Talk to your pharmacist or health care provider if you have questions. Talk to your pediatrician regarding the use of this medicine in children. Special care may be needed. Overdosage: If you think you have taken too much of this medicine contact a poison control center or emergency room at once. NOTE: This medicine is only for you. Do not share this medicine with others. What if I miss a dose? If you miss a dose, take it as soon as you can within 5 days after the missed dose. Then take your next dose at your regular weekly time. If it has been longer than 5 days after the missed dose, do not take the missed dose. Take the next dose at your regular time. Do not take double or extra doses. If you have questions about a missed dose, contact your health care provider for advice.  What may interact with this medicine?  other medicines for diabetes Many medications may cause changes in blood sugar, these include:  alcohol containing beverages  antiviral medicines for HIV or AIDS  aspirin and aspirin-like drugs  certain medicines for blood pressure, heart disease, irregular heart beat  chromium  diuretics  male hormones, such as estrogens or progestins, birth control pills  fenofibrate  gemfibrozil  isoniazid  lanreotide  male hormones or anabolic steroids  MAOIs like Carbex, Eldepryl, Marplan, Nardil, and Parnate  medicines for weight loss  medicines for allergies, asthma,  cold, or cough  medicines for depression, anxiety, or psychotic disturbances  niacin  nicotine  NSAIDs, medicines for pain and inflammation, like ibuprofen or naproxen  octreotide  pasireotide  pentamidine  phenytoin  probenecid  quinolone antibiotics such as ciprofloxacin, levofloxacin, ofloxacin  some herbal dietary supplements  steroid medicines such as prednisone or cortisone  sulfamethoxazole; trimethoprim  thyroid hormones Some medications can hide the warning symptoms of low blood sugar (hypoglycemia). You may need to monitor your blood sugar more closely if you are taking one of these medications. These include:  beta-blockers, often used for high blood pressure or heart problems (examples include atenolol, metoprolol, propranolol)  clonidine  guanethidine  reserpine This list may not describe all possible interactions. Give your health care provider a list of all the medicines, herbs, non-prescription drugs, or dietary supplements you use. Also tell them if you smoke, drink alcohol, or use illegal drugs. Some items may interact with your medicine. What should I watch for while using this medicine? Visit your doctor or health care professional for regular checks on your progress. Drink plenty of fluids while taking this medicine. Check with your doctor or health care professional if you get an attack of severe diarrhea, nausea, and vomiting. The loss of too much body fluid can make it dangerous for you to take this medicine. A test called the HbA1C (A1C) will be monitored. This is a simple blood test. It measures your blood sugar control over the last 2 to 3 months. You will receive this test every 3 to 6 months. Learn how to check your blood sugar. Learn the symptoms of low and high blood sugar and how to manage them. Always carry a quick-source of sugar with you in case you have symptoms of low blood sugar. Examples include hard sugar candy or glucose tablets.  Make sure others know that you can choke if you eat or drink when you develop serious symptoms of low blood sugar, such as seizures or unconsciousness. They must get medical help at once. Tell your doctor or health care professional if you have high blood sugar. You might need to change the dose of your medicine. If you are sick or exercising more than usual, you might need to change the dose of your medicine. Do not skip meals. Ask your doctor or health care professional if you should avoid alcohol. Many nonprescription cough and cold products contain sugar or alcohol. These can affect blood sugar. Pens should never be shared. Even if the needle is changed, sharing may result in passing of viruses like hepatitis or HIV. Wear a medical ID bracelet or chain, and carry a card that describes your disease and details of your medicine and dosage times. Do not become pregnant while taking this medicine. Women should inform their doctor if they wish to become pregnant or think they might be pregnant. There is a potential for serious side effects to an unborn child.  Talk to your health care professional or pharmacist for more information. What side effects may I notice from receiving this medicine? Side effects that you should report to your doctor or health care professional as soon as possible:  allergic reactions like skin rash, itching or hives, swelling of the face, lips, or tongue  breathing problems  changes in vision  diarrhea that continues or is severe  lump or swelling on the neck  severe nausea  signs and symptoms of infection like fever or chills; cough; sore throat; pain or trouble passing urine  signs and symptoms of low blood sugar such as feeling anxious, confusion, dizziness, increased hunger, unusually weak or tired, sweating, shakiness, cold, irritable, headache, blurred vision, fast heartbeat, loss of consciousness  signs and symptoms of kidney injury like trouble passing urine  or change in the amount of urine  trouble swallowing  unusual stomach upset or pain  vomiting Side effects that usually do not require medical attention (report to your doctor or health care professional if they continue or are bothersome):  constipation  diarrhea  nausea  pain, redness, or irritation at site where injected  stomach upset This list may not describe all possible side effects. Call your doctor for medical advice about side effects. You may report side effects to FDA at 1-800-FDA-1088. Where should I keep my medicine? Keep out of the reach of children. Store unopened pens in a refrigerator between 2 and 8 degrees C (36 and 46 degrees F). Do not freeze. Protect from light and heat. After you first use the pen, it can be stored for 56 days at room temperature between 15 and 30 degrees C (59 and 86 degrees F) or in a refrigerator. Throw away your used pen after 56 days or after the expiration date, whichever comes first. Do not store your pen with the needle attached. If the needle is left on, medicine may leak from the pen. NOTE: This sheet is a summary. It may not cover all possible information. If you have questions about this medicine, talk to your doctor, pharmacist, or health care provider.  2020 Elsevier/Gold Standard (2019-01-29 09:41:51)

## 2019-11-30 ENCOUNTER — Other Ambulatory Visit: Payer: Self-pay | Admitting: Adult Health

## 2019-11-30 DIAGNOSIS — R7989 Other specified abnormal findings of blood chemistry: Secondary | ICD-10-CM

## 2019-11-30 LAB — COMPLETE METABOLIC PANEL WITH GFR
AG Ratio: 1.5 (calc) (ref 1.0–2.5)
ALT: 65 U/L — ABNORMAL HIGH (ref 9–46)
AST: 48 U/L — ABNORMAL HIGH (ref 10–35)
Albumin: 4.5 g/dL (ref 3.6–5.1)
Alkaline phosphatase (APISO): 60 U/L (ref 35–144)
BUN: 15 mg/dL (ref 7–25)
CO2: 26 mmol/L (ref 20–32)
Calcium: 10.1 mg/dL (ref 8.6–10.3)
Chloride: 101 mmol/L (ref 98–110)
Creat: 1.04 mg/dL (ref 0.70–1.33)
GFR, Est African American: 91 mL/min/{1.73_m2} (ref >=60)
GFR, Est Non African American: 79 mL/min/{1.73_m2} (ref >=60)
Globulin: 3 g/dL (calc) (ref 1.9–3.7)
Glucose, Bld: 311 mg/dL — ABNORMAL HIGH (ref 65–99)
Potassium: 4.9 mmol/L (ref 3.5–5.3)
Sodium: 137 mmol/L (ref 135–146)
Total Bilirubin: 1.1 mg/dL (ref 0.2–1.2)
Total Protein: 7.5 g/dL (ref 6.1–8.1)

## 2019-11-30 LAB — HEMOGLOBIN A1C
Hgb A1c MFr Bld: 10.3 % of total Hgb — ABNORMAL HIGH (ref <5.7)
Mean Plasma Glucose: 249 (calc)
eAG (mmol/L): 13.8 (calc)

## 2019-11-30 LAB — CBC WITH DIFFERENTIAL/PLATELET
Absolute Monocytes: 557 cells/uL (ref 200–950)
Basophils Absolute: 32 cells/uL (ref 0–200)
Basophils Relative: 0.5 %
Eosinophils Absolute: 160 cells/uL (ref 15–500)
Eosinophils Relative: 2.5 %
HCT: 51.9 % — ABNORMAL HIGH (ref 38.5–50.0)
Hemoglobin: 17.9 g/dL — ABNORMAL HIGH (ref 13.2–17.1)
Lymphs Abs: 2170 cells/uL (ref 850–3900)
MCH: 32.2 pg (ref 27.0–33.0)
MCHC: 34.5 g/dL (ref 32.0–36.0)
MCV: 93.3 fL (ref 80.0–100.0)
MPV: 10.3 fL (ref 7.5–12.5)
Monocytes Relative: 8.7 %
Neutro Abs: 3482 cells/uL (ref 1500–7800)
Neutrophils Relative %: 54.4 %
Platelets: 215 10*3/uL (ref 140–400)
RBC: 5.56 10*6/uL (ref 4.20–5.80)
RDW: 12 % (ref 11.0–15.0)
Total Lymphocyte: 33.9 %
WBC: 6.4 10*3/uL (ref 3.8–10.8)

## 2019-11-30 LAB — LIPID PANEL
Cholesterol: 195 mg/dL (ref <200)
HDL: 42 mg/dL (ref >=40)
LDL Cholesterol (Calc): 121 mg/dL (calc) — ABNORMAL HIGH
Non-HDL Cholesterol (Calc): 153 mg/dL (calc) — ABNORMAL HIGH (ref <130)
Total CHOL/HDL Ratio: 4.6 (calc) (ref <5.0)
Triglycerides: 199 mg/dL — ABNORMAL HIGH (ref <150)

## 2019-11-30 LAB — MAGNESIUM: Magnesium: 1.7 mg/dL (ref 1.5–2.5)

## 2019-11-30 LAB — TSH: TSH: 2.29 mIU/L (ref 0.40–4.50)

## 2019-12-11 ENCOUNTER — Other Ambulatory Visit: Payer: 59

## 2019-12-24 ENCOUNTER — Ambulatory Visit
Admission: RE | Admit: 2019-12-24 | Discharge: 2019-12-24 | Disposition: A | Discharge status: Home / Self Care | Payer: 59 | Source: Ambulatory Visit | Attending: Adult Health | Admitting: Adult Health

## 2019-12-24 DIAGNOSIS — R7989 Other specified abnormal findings of blood chemistry: Secondary | ICD-10-CM

## 2019-12-25 ENCOUNTER — Encounter: Payer: Self-pay | Admitting: Adult Health

## 2019-12-25 DIAGNOSIS — K760 Fatty (change of) liver, not elsewhere classified: Secondary | ICD-10-CM | POA: Insufficient documentation

## 2019-12-25 DIAGNOSIS — K8020 Calculus of gallbladder without cholecystitis without obstruction: Secondary | ICD-10-CM | POA: Insufficient documentation

## 2019-12-26 NOTE — Progress Notes (Deleted)
Assessment and Plan:  1. T2_NIDDM ([place]) ***  2. Morbid obesity ([place]) ***     Further disposition pending results of labs. Discussed med's effects and SE's.   Over 30 minutes of exam, counseling, chart review, and critical decision making was performed.   Future Appointments  Date Time Provider [place]  12/30/2019  8:45 AM [name], NP [facility] None  02/10/2020  9:30 AM [name], MD [facility] None  08/03/2020  9:00 AM [name], MD [facility] None    ------------------------------------------------------------------------------------------------------------------   HPI There were no vitals taken for this visit.  59 y.o.male with morbid obesity, T2DM, hepatic steatosis presents for 1 month follow up after initiation of ozempic ***    BMI is There is no height or weight on file to calculate BMI., he {HAS HAS QPR:91638} been working on diet and exercise. Wt Readings from Last 3 Encounters:  11/29/19 260 lb (117.9 kg)  10/07/19 268 lb 12.8 oz (121.9 kg)  10/03/19 264 lb (119.7 kg)   He {Has/has not:18111} been working on diet and exercise for prediabetes, and denies {Symptoms; diabetes w/o none:19199}. Last A1C in the office was:  Lab Results  Component Value Date   HGBA1C 10.3 (H) 11/29/2019    Past Medical History:  Diagnosis Date  . Allergy    seasonal  . Arthritis   . Blood transfusion 09/2011   2 units  . Diabetes mellitus without complication ([place])   . History of adenomatous polyp of colon 10/07/2019   Due 10/03/2022 per Dr. Henrene Pastor  . Hyperlipidemia   . Hypertension   . Renal disorder   . Stomach ulcer      Allergies  Allergen Reactions  . Codeine Nausea And Vomiting  . Nsaids Other (See Comments)    Bleeding ulcer 2013    Current Outpatient Medications on File Prior to Visit  Medication Sig  . allopurinol (ZYLOPRIM) 300 MG tablet Take 1 tablet Daily to prevent Gout  . aspirin 81 MG tablet Take 81 mg by mouth daily.  Reported on 09/23/2015  . atorvastatin (LIPITOR) 40 MG tablet Take 1 tablet (40 mg total) by mouth daily at 6 PM.  . Blood Glucose Monitoring Suppl (FREESTYLE LITE) DEVI 1 Device by Does not apply route 3 (three) times daily.  . Cholecalciferol (VITAMIN D PO) Take 10,000 Units by mouth daily.  Marland Kitchen glucose blood (FREESTYLE LITE) test strip Use as instructed  . Lancets (FREESTYLE) lancets Use as instructed  . losartan-hydrochlorothiazide (HYZAAR) 100-12.5 MG tablet Take 1 tablet by mouth daily.  . meloxicam (MOBIC) 7.5 MG tablet TAKE 1 TABLET (7.5 MG TOTAL) BY MOUTH AS NEEDED  . metFORMIN (GLUMETZA) 1000 MG (MOD) 24 hr tablet Take 1 tablet 2 x /day with a Meal for Diabetes  . OVER THE COUNTER MEDICATION Takes a MVI. (Patient not taking: Reported on 11/29/2019)  . phentermine (ADIPEX-P) 37.5 MG tablet Take 1/2 to 1 tablet every morning for Dieting & Weight  Loss (Patient not taking: Reported on 11/29/2019)  . Semaglutide,0.25 or 0.5MG /DOS, (OZEMPIC, 0.25 OR 0.5 MG/DOSE,) 2 MG/1.5ML SOPN Inject 0.1875 mLs (0.25 mg total) into the skin once a week.  . topiramate (TOPAMAX) 50 MG tablet Take 1/2 to 1 tablet 2 x /day at Suppertime & Bedtime for Dieting & Weight Loss (Patient not taking: Reported on 11/29/2019)  . Zinc 50 MG TABS Take 1 tablet Daily   No current facility-administered medications on file prior to visit.    ROS: all negative except above.   Physical  Exam:  There were no vitals taken for this visit.  General Appearance: Well nourished, in no apparent distress. Eyes: PERRLA, EOMs, conjunctiva no swelling or erythema Sinuses: No Frontal/maxillary tenderness ENT/Mouth: Ext aud canals clear, TMs without erythema, bulging. No erythema, swelling, or exudate on post pharynx.  Tonsils not swollen or erythematous. Hearing normal.  Neck: Supple, thyroid normal.  Respiratory: Respiratory effort normal, BS equal bilaterally without rales, rhonchi, wheezing or stridor.  Cardio: RRR with no MRGs. Brisk  peripheral pulses without edema.  Abdomen: Soft, + BS.  Non tender, no guarding, rebound, hernias, masses. Lymphatics: Non tender without lymphadenopathy.  Musculoskeletal: Full ROM, 5/5 strength, normal gait.  Skin: Warm, dry without rashes, lesions, ecchymosis.  Neuro: Cranial nerves intact. Normal muscle tone, no cerebellar symptoms. Sensation intact.  Psych: Awake and oriented X 3, normal affect, Insight and Judgment appropriate.     Izora Ribas, NP 2:29 PM [facility]

## 2019-12-30 ENCOUNTER — Ambulatory Visit: Payer: 59 | Admitting: Adult Health

## 2020-02-08 ENCOUNTER — Encounter: Payer: Self-pay | Admitting: Internal Medicine

## 2020-02-08 NOTE — Patient Instructions (Signed)

## 2020-02-08 NOTE — Progress Notes (Signed)
History of Present Illness:       This very nice 59 y.o. MWM presents for 6  month follow up with HTN, HLD,  T2_NIDDM and Vitamin D Deficiency.  Patient  also has hx/o Gout and has sporadically taken his Allopurinol.        Patient is treated for HTN  (2001)  & BP has been controlled at home. Today's BP is not at goal at 154/84 - Apparently he's been off of his BP meds for 2-3 weeks !  Patient has had no complaints of any cardiac type chest pain, palpitations, dyspnea / orthopnea / PND, dizziness, claudication, or dependent edema.      Hyperlipidemia is not controlled with diet &  Sporadic Atorvastatin. Patient denies myalgias or other med SE's.  Apparently, he's been off of his Atorvastatin for 2-3 weeks !   Last Lipids were not at goal (poor compliance suspected):  Lab Results  Component Value Date   CHOL 195 11/29/2019   HDL 42 11/29/2019   LDLCALC 121 (H) 11/29/2019   TRIG 199 (H) 11/29/2019   CHOLHDL 4.6 11/29/2019    Also, the patient has Morbid Obesity (BMI 36+) and consequent T2_NIDDM  (A1c 7.1% /2017) and has had no symptoms of reactive hypoglycemia, diabetic polys, paresthesias or visual blurring.  He does not monitor CBG's as advised.  Last A1c was not at goal and he was added Ozempic (again suspected compliance related):  Lab Results  Component Value Date   HGBA1C 10.3 (H) 11/29/2019           Further, the patient also has history of Vitamin D Deficiency  ("20" /2008)  and supplements vitamin D without any suspected side-effects. Last vitamin D was at goal:   Lab Results  Component Value Date   VD25OH 66 07/23/2019    Current Outpatient Medications on File Prior to Visit  Medication Sig  . allopurinol (ZYLOPRIM) 300 MG tablet Take 1 tablet Daily to prevent Gout  . aspirin 81 MG tablet Take 81 mg by mouth daily. Reported on 09/23/2015  . Blood Glucose Monitoring Suppl (FREESTYLE LITE) DEVI 1 Device by Does not apply route 3 (three) times daily.  .  Cholecalciferol (VITAMIN D PO) Take 10,000 Units by mouth daily.  Marland Kitchen glucose blood (FREESTYLE LITE) test strip Use as instructed  . Lancets (FREESTYLE) lancets Use as instructed  . meloxicam (MOBIC) 7.5 MG tablet TAKE 1 TABLET (7.5 MG TOTAL) BY MOUTH AS NEEDED  . metFORMIN (GLUMETZA) 1000 MG (MOD) 24 hr tablet Take 1 tablet 2 x /day with a Meal for Diabetes  . OVER THE COUNTER MEDICATION Takes a MVI.   Marland Kitchen Semaglutide,0.25 or 0.5MG /DOS, (OZEMPIC, 0.25 OR 0.5 MG/DOSE,) 2 MG/1.5ML SOPN Inject 0.1875 mLs (0.25 mg total) into the skin once a week.  . Zinc 50 MG TABS Take 1 tablet Daily  . phentermine (ADIPEX-P) 37.5 MG tablet Take 1/2 to 1 tablet every morning for Dieting & Weight  Loss (Patient not taking: Reported on 11/29/2019)  . topiramate (TOPAMAX) 50 MG tablet Take 1/2 to 1 tablet 2 x /day at Suppertime & Bedtime for Dieting & Weight Loss (Patient not taking: Reported on 11/29/2019)   No current facility-administered medications on file prior to visit.    Allergies  Allergen Reactions  . Codeine Nausea And Vomiting  . Nsaids Other (See Comments)    Bleeding ulcer 2013    PMHx:   Past Medical History:  Diagnosis Date  . Allergy  seasonal  . Arthritis   . Blood transfusion 09/2011   2 units  . Diabetes mellitus without complication ([place])   . History of adenomatous polyp of colon 10/07/2019   Due 10/03/2022 per Dr. Henrene Pastor  . Hyperlipidemia   . Hypertension   . Renal disorder   . Stomach ulcer     Immunization History  Administered Date(s) Administered  . PPD Test 01/02/2014, 03/11/2015, 03/31/2016, 05/03/2017, 06/04/2018, 07/23/2019  . Pneumococcal Polysaccharide-23 10/04/2010, 05/03/2017  . Tdap 10/10/2011    Past Surgical History:  Procedure Laterality Date  . COLONOSCOPY    . ESOPHAGOGASTRODUODENOSCOPY  10/26/2011   Procedure: ESOPHAGOGASTRODUODENOSCOPY (EGD);  Surgeon: Irene Shipper, MD;  Location: [facility] ENDOSCOPY;  Service: Endoscopy;  Laterality: N/A;  . FRACTURE SURGERY   broken collar bone  . POLYPECTOMY    . UPPER GASTROINTESTINAL ENDOSCOPY      FHx:    Reviewed / unchanged  SHx:    Reviewed / unchanged   Systems Review:  Constitutional: Denies fever, chills, wt changes, headaches, insomnia, fatigue, night sweats, change in appetite. Eyes: Denies redness, blurred vision, diplopia, discharge, itchy, watery eyes.  ENT: Denies discharge, congestion, post nasal drip, epistaxis, sore throat, earache, hearing loss, dental pain, tinnitus, vertigo, sinus pain, snoring.  CV: Denies chest pain, palpitations, irregular heartbeat, syncope, dyspnea, diaphoresis, orthopnea, PND, claudication or edema. Respiratory: denies cough, dyspnea, DOE, pleurisy, hoarseness, laryngitis, wheezing.  Gastrointestinal: Denies dysphagia, odynophagia, heartburn, reflux, water brash, abdominal pain or cramps, nausea, vomiting, bloating, diarrhea, constipation, hematemesis, melena, hematochezia  or hemorrhoids. Genitourinary: Denies dysuria, frequency, urgency, nocturia, hesitancy, discharge, hematuria or flank pain. Musculoskeletal: Denies arthralgias, myalgias, stiffness, jt. swelling, pain, limping or strain/sprain.  Skin: Denies pruritus, rash, hives, warts, acne, eczema or change in skin lesion(s). Neuro: No weakness, tremor, incoordination, spasms, paresthesia or pain. Psychiatric: Denies confusion, memory loss or sensory loss. Endo: Denies change in weight, skin or hair change.  Heme/Lymph: No excessive bleeding, bruising or enlarged lymph nodes.  Physical Exam  BP (!) 154/84   Pulse 60   Temp (!) 97.2 F (36.2 C)   Resp 16   Ht 6' (1.829 m)   Wt 261 lb (118.4 kg)   BMI 35.40 kg/m   Appears  well nourished, well groomed  and in no distress.  Eyes: PERRLA, EOMs, conjunctiva no swelling or erythema. Sinuses: No frontal/maxillary tenderness ENT/Mouth: EAC's clear, TM's nl w/o erythema, bulging. Nares clear w/o erythema, swelling, exudates. Oropharynx clear without  erythema or exudates. Oral hygiene is good. Tongue normal, non obstructing. Hearing intact.  Neck: Supple. Thyroid not palpable. Car 2+/2+ without bruits, nodes or JVD. Chest: Respirations nl with BS clear & equal w/o rales, rhonchi, wheezing or stridor.  Cor: Heart sounds normal w/ regular rate and rhythm without sig. murmurs, gallops, clicks or rubs. Peripheral pulses normal and equal  without edema.  Abdomen: Soft & bowel sounds normal. Non-tender w/o guarding, rebound, hernias, masses or organomegaly.  Lymphatics: Unremarkable.  Musculoskeletal: Full ROM all peripheral extremities, joint stability, 5/5 strength and normal gait.  Skin: Warm, dry without exposed rashes, lesions or ecchymosis apparent.  Neuro: Cranial nerves intact, reflexes equal bilaterally. Sensory-motor testing grossly intact. Tendon reflexes grossly intact.  Pysch: Alert & oriented x 3.  Insight and judgement nl & appropriate. No ideations.  Assessment and Plan:  1. Essential hypertension  - Continue medication, monitor blood pressure at home.  - Continue DASH diet.  Reminder to go to the ER if any CP,  SOB, nausea, dizziness, severe HA,  changes vision/speech.  - CBC with Differential/Platelet - COMPLETE METABOLIC PANEL WITH GFR - Magnesium - TSH  2. Hyperlipidemia associated with type 2 diabetes mellitus ([place])  - Continue diet/meds, exercise,& lifestyle modifications.  - Continue monitor periodic cholesterol/liver & renal functions   - Lipid panel - TSH  3. Type 2 diabetes mellitus with stage 2 chronic kidney disease, without long-term current use of insulin (H[facility])  - Continue diet, exercise  - Lifestyle modifications.  - Monitor appropriate labs.  - Hemoglobin A1c - Insulin, random  4. Vitamin D deficiency  - Continue supplementation.  - VITAMIN D 25 Hydroxy  5. Idiopathic gout  - Uric acid  6. Medication management  - CBC with Differential/Platelet - COMPLETE METABOLIC PANEL WITH GFR -  Magnesium - Lipid panel - TSH - Hemoglobin A1c - Insulin, random - VITAMIN D 25 Hydroxy  - Uric acid         Discussed  regular exercise, BP monitoring, weight control to achieve/maintain BMI less than 25 and discussed med and SE's. Recommended labs to assess and monitor clinical status with further disposition pending results of labs.  I discussed the assessment and treatment plan with the patient. The patient was provided an opportunity to ask questions and all were answered. The patient agreed with the plan and demonstrated an understanding of the instructions.  I provided over 30 minutes of exam, counseling, chart review and  complex critical decision making.         The patient was advised to call back or seek an in-person evaluation if the symptoms worsen or if the condition fails to improve as anticipated.   Kirtland Bouchard, MD

## 2020-02-10 ENCOUNTER — Other Ambulatory Visit: Payer: Self-pay

## 2020-02-10 ENCOUNTER — Other Ambulatory Visit: Payer: Self-pay | Admitting: *Deleted

## 2020-02-10 ENCOUNTER — Ambulatory Visit (INDEPENDENT_AMBULATORY_CARE_PROVIDER_SITE_OTHER): Payer: 59 | Admitting: Internal Medicine

## 2020-02-10 VITALS — BP 154/84 | HR 60 | Temp 97.2°F | Resp 16 | Ht 72.0 in | Wt 261.0 lb

## 2020-02-10 DIAGNOSIS — E1169 Type 2 diabetes mellitus with other specified complication: Secondary | ICD-10-CM | POA: Diagnosis not present

## 2020-02-10 DIAGNOSIS — E559 Vitamin D deficiency, unspecified: Secondary | ICD-10-CM | POA: Diagnosis not present

## 2020-02-10 DIAGNOSIS — M1000 Idiopathic gout, unspecified site: Secondary | ICD-10-CM

## 2020-02-10 DIAGNOSIS — E1122 Type 2 diabetes mellitus with diabetic chronic kidney disease: Secondary | ICD-10-CM

## 2020-02-10 DIAGNOSIS — E782 Mixed hyperlipidemia: Secondary | ICD-10-CM

## 2020-02-10 DIAGNOSIS — I10 Essential (primary) hypertension: Secondary | ICD-10-CM

## 2020-02-10 DIAGNOSIS — N182 Chronic kidney disease, stage 2 (mild): Secondary | ICD-10-CM

## 2020-02-10 DIAGNOSIS — E785 Hyperlipidemia, unspecified: Secondary | ICD-10-CM

## 2020-02-10 DIAGNOSIS — Z79899 Other long term (current) drug therapy: Secondary | ICD-10-CM

## 2020-02-10 DIAGNOSIS — E119 Type 2 diabetes mellitus without complications: Secondary | ICD-10-CM

## 2020-02-10 MED ORDER — LOSARTAN POTASSIUM-HCTZ 100-12.5 MG PO TABS: ORAL_TABLET | ORAL | 1 refills | Status: DC

## 2020-02-10 MED ORDER — ATORVASTATIN CALCIUM 40 MG PO TABS: ORAL_TABLET | ORAL | 1 refills | Status: DC

## 2020-02-10 MED ORDER — OZEMPIC (0.25 OR 0.5 MG/DOSE) 2 MG/1.5ML ~~LOC~~ SOPN: PEN_INJECTOR | SUBCUTANEOUS | 1 refills | Status: DC

## 2020-02-11 ENCOUNTER — Other Ambulatory Visit: Payer: 59

## 2020-02-11 ENCOUNTER — Other Ambulatory Visit: Payer: Self-pay | Admitting: Internal Medicine

## 2020-02-11 DIAGNOSIS — N182 Chronic kidney disease, stage 2 (mild): Secondary | ICD-10-CM

## 2020-02-11 DIAGNOSIS — Z20822 Contact with and (suspected) exposure to covid-19: Secondary | ICD-10-CM

## 2020-02-11 DIAGNOSIS — E1122 Type 2 diabetes mellitus with diabetic chronic kidney disease: Secondary | ICD-10-CM

## 2020-02-11 LAB — CBC WITH DIFFERENTIAL/PLATELET
Absolute Monocytes: 440 cells/uL (ref 200–950)
Basophils Absolute: 31 cells/uL (ref 0–200)
Basophils Relative: 0.5 %
Eosinophils Absolute: 260 cells/uL (ref 15–500)
Eosinophils Relative: 4.2 %
HCT: 52.3 % — ABNORMAL HIGH (ref 38.5–50.0)
Hemoglobin: 17.3 g/dL — ABNORMAL HIGH (ref 13.2–17.1)
Lymphs Abs: 2517 cells/uL (ref 850–3900)
MCH: 31.1 pg (ref 27.0–33.0)
MCHC: 33.1 g/dL (ref 32.0–36.0)
MCV: 94.1 fL (ref 80.0–100.0)
MPV: 10.1 fL (ref 7.5–12.5)
Monocytes Relative: 7.1 %
Neutro Abs: 2951 cells/uL (ref 1500–7800)
Neutrophils Relative %: 47.6 %
Platelets: 221 10*3/uL (ref 140–400)
RBC: 5.56 10*6/uL (ref 4.20–5.80)
RDW: 11.8 % (ref 11.0–15.0)
Total Lymphocyte: 40.6 %
WBC: 6.2 10*3/uL (ref 3.8–10.8)

## 2020-02-11 LAB — COMPLETE METABOLIC PANEL WITH GFR
AG Ratio: 1.6 (calc) (ref 1.0–2.5)
ALT: 48 U/L — ABNORMAL HIGH (ref 9–46)
AST: 27 U/L (ref 10–35)
Albumin: 4.5 g/dL (ref 3.6–5.1)
Alkaline phosphatase (APISO): 60 U/L (ref 35–144)
BUN: 12 mg/dL (ref 7–25)
CO2: 29 mmol/L (ref 20–32)
Calcium: 9.4 mg/dL (ref 8.6–10.3)
Chloride: 98 mmol/L (ref 98–110)
Creat: 0.89 mg/dL (ref 0.70–1.33)
GFR, Est African American: 109 mL/min/{1.73_m2} (ref >=60)
GFR, Est Non African American: 94 mL/min/{1.73_m2} (ref >=60)
Globulin: 2.9 g/dL (calc) (ref 1.9–3.7)
Glucose, Bld: 380 mg/dL — ABNORMAL HIGH (ref 65–99)
Potassium: 4.2 mmol/L (ref 3.5–5.3)
Sodium: 137 mmol/L (ref 135–146)
Total Bilirubin: 1.1 mg/dL (ref 0.2–1.2)
Total Protein: 7.4 g/dL (ref 6.1–8.1)

## 2020-02-11 LAB — HEMOGLOBIN A1C
Hgb A1c MFr Bld: 10.5 % of total Hgb — ABNORMAL HIGH (ref <5.7)
Mean Plasma Glucose: 255 (calc)
eAG (mmol/L): 14.1 (calc)

## 2020-02-11 LAB — VITAMIN D 25 HYDROXY (VIT D DEFICIENCY, FRACTURES): Vit D, 25-Hydroxy: 55 ng/mL (ref 30–100)

## 2020-02-11 LAB — LIPID PANEL
Cholesterol: 192 mg/dL (ref <200)
HDL: 40 mg/dL (ref >=40)
LDL Cholesterol (Calc): 107 mg/dL (calc) — ABNORMAL HIGH
Non-HDL Cholesterol (Calc): 152 mg/dL (calc) — ABNORMAL HIGH (ref <130)
Total CHOL/HDL Ratio: 4.8 (calc) (ref <5.0)
Triglycerides: 337 mg/dL — ABNORMAL HIGH (ref <150)

## 2020-02-11 LAB — INSULIN, RANDOM: Insulin: 55.9 u[IU]/mL — ABNORMAL HIGH

## 2020-02-11 LAB — URIC ACID: Uric Acid, Serum: 6.7 mg/dL (ref 4.0–8.0)

## 2020-02-11 LAB — TSH: TSH: 2.17 mIU/L (ref 0.40–4.50)

## 2020-02-11 LAB — MAGNESIUM: Magnesium: 1.9 mg/dL (ref 1.5–2.5)

## 2020-02-11 MED ORDER — METFORMIN HCL ER 500 MG PO TB24: ORAL_TABLET | ORAL | 0 refills | Status: DC

## 2020-02-11 MED ORDER — GLIPIZIDE 5 MG PO TABS: ORAL_TABLET | ORAL | 0 refills | Status: DC

## 2020-02-11 NOTE — Progress Notes (Signed)
========================================================== -   Test results slightly outside the reference range are not unusual. If there is anything important, I will review this with you,  otherwise it is considered normal test values.  If you have further questions,  please do not hesitate to contact me at the office or via My Chart.  ==========================================================  -  Glucose was 380 mg% - Way too high !  - and A1c 10.5%   also way too high  - So sent in new Rx as discussed Metformin 500 mg ER (Slow Release) to take 4 tablets /day as discussed & replace the Metformin 1,000 mg tablets  - Also sent in a 2sd Rx for Glipizide to take 3 x day  with meals to help lower blood sugar  - Be sure taking your Ozempic shots at at 0.5 mg ( 0.375 ml) every 7 days   - Also recommend that you restart the Phentermine & Topiramate to help with weight loss ( if you need new Rx's sent in,  please let me know) ==========================================================  -  Vitamin D = 55 - is a little low   - Vitamin D goal is between 70-100.   - Please make sure that you are taking your  Vitamin D 10,000 units /day as directed.   - It is very important as a natural anti-inflammatory and helping the  immune system protect against viral infections, like the Covid-19    helping hair, skin, and nails, as well as reducing stroke and heart attack risk.   - It helps your bones and helps with mood.  - It also decreases numerous cancer risks so please take it as directed.   - Low Vit D is associated with a 200-300% higher risk for CANCER   and 200-300% higher risk for HEART   ATTACK  &  STROKE.    - It is also associated with higher death rate at younger ages,   autoimmune diseases like Rheumatoid arthritis, Lupus, Multiple Sclerosis.     - Also many other serious conditions, like depression, Alzheimer's  Dementia, infertility, muscle aches, fatigue, fibromyalgia -  just to name a few ==========================================================  -  Uric acid / Gout test is Normal & OK  ==========================================================  -   All Else - CBC - Kidneys - Electrolytes - Liver - Magnesium & Thyroid    - all  Normal / OK ==========================================================

## 2020-02-12 LAB — SPECIMEN STATUS REPORT

## 2020-02-12 LAB — SARS-COV-2, NAA 2 DAY TAT

## 2020-02-12 LAB — NOVEL CORONAVIRUS, NAA: SARS-CoV-2, NAA: NOT DETECTED

## 2020-02-18 ENCOUNTER — Other Ambulatory Visit: Payer: Self-pay | Admitting: *Deleted

## 2020-02-18 DIAGNOSIS — E1122 Type 2 diabetes mellitus with diabetic chronic kidney disease: Secondary | ICD-10-CM

## 2020-02-18 DIAGNOSIS — E785 Hyperlipidemia, unspecified: Secondary | ICD-10-CM

## 2020-02-18 DIAGNOSIS — N182 Chronic kidney disease, stage 2 (mild): Secondary | ICD-10-CM

## 2020-02-18 DIAGNOSIS — E1169 Type 2 diabetes mellitus with other specified complication: Secondary | ICD-10-CM

## 2020-02-18 MED ORDER — OZEMPIC (0.25 OR 0.5 MG/DOSE) 2 MG/1.5ML ~~LOC~~ SOPN: PEN_INJECTOR | SUBCUTANEOUS | 1 refills | Status: DC

## 2020-05-06 ENCOUNTER — Other Ambulatory Visit: Payer: Self-pay | Admitting: Internal Medicine

## 2020-05-06 DIAGNOSIS — E1122 Type 2 diabetes mellitus with diabetic chronic kidney disease: Secondary | ICD-10-CM

## 2020-05-06 DIAGNOSIS — N182 Chronic kidney disease, stage 2 (mild): Secondary | ICD-10-CM

## 2020-05-07 DIAGNOSIS — E1122 Type 2 diabetes mellitus with diabetic chronic kidney disease: Secondary | ICD-10-CM

## 2020-05-07 DIAGNOSIS — E785 Hyperlipidemia, unspecified: Secondary | ICD-10-CM

## 2020-05-07 DIAGNOSIS — E1169 Type 2 diabetes mellitus with other specified complication: Secondary | ICD-10-CM

## 2020-05-11 ENCOUNTER — Ambulatory Visit (INDEPENDENT_AMBULATORY_CARE_PROVIDER_SITE_OTHER): Payer: Commercial Managed Care - PPO | Admitting: Adult Health Nurse Practitioner

## 2020-05-11 ENCOUNTER — Other Ambulatory Visit: Payer: Self-pay

## 2020-05-11 ENCOUNTER — Ambulatory Visit: Payer: 59 | Admitting: Adult Health Nurse Practitioner

## 2020-05-11 ENCOUNTER — Encounter: Payer: Self-pay | Admitting: Adult Health Nurse Practitioner

## 2020-05-11 VITALS — BP 128/86 | HR 92 | Temp 97.7°F | Wt 264.0 lb

## 2020-05-11 DIAGNOSIS — I10 Essential (primary) hypertension: Secondary | ICD-10-CM

## 2020-05-11 DIAGNOSIS — E785 Hyperlipidemia, unspecified: Secondary | ICD-10-CM

## 2020-05-11 DIAGNOSIS — M25512 Pain in left shoulder: Secondary | ICD-10-CM | POA: Diagnosis not present

## 2020-05-11 DIAGNOSIS — N182 Chronic kidney disease, stage 2 (mild): Secondary | ICD-10-CM

## 2020-05-11 DIAGNOSIS — E1122 Type 2 diabetes mellitus with diabetic chronic kidney disease: Secondary | ICD-10-CM | POA: Diagnosis not present

## 2020-05-11 DIAGNOSIS — E1169 Type 2 diabetes mellitus with other specified complication: Secondary | ICD-10-CM

## 2020-05-11 MED ORDER — MELOXICAM 7.5 MG PO TABS: ORAL_TABLET | ORAL | 1 refills | Status: DC

## 2020-05-11 MED ORDER — OZEMPIC (0.25 OR 0.5 MG/DOSE) 2 MG/1.5ML ~~LOC~~ SOPN: PEN_INJECTOR | SUBCUTANEOUS | 1 refills | Status: DC

## 2020-05-11 NOTE — Progress Notes (Signed)
FOLLOW UP 3 MONTH  Assessment and Plan:   Hypertension Losartan 100/HCTZ 12.5mg  off of meds, start using pill box for consistency Monitor blood pressure at home; patient to call if consistently greater than 130/80 Continue DASH diet.   Reminder to go to the ER if any CP, SOB, nausea, dizziness, severe HA, changes vision/speech, left arm numbness and tingling and jaw pain. Follow up recheck 1 month   Cholesterol Stopped Atorvastatin 40mg  related to brain fog. Discussed rosuvastatin 5mg  every other day?  Will check labs Continue low cholesterol diet and exercise.    Diabetes with diabetic chronic kidney disease Continue medication: start back on metformin 500mg  two tablets daily and Glipazide 5mg  BID. Using ozempic unsure of dose, 1mg ? Sent in glucometer and supplies to start checking fasting glucose daily  Continue diet and exercise.  Perform daily foot/skin check, notify office of any concerning changes.  Check A1C  Morbid Obesity with co morbidities Long discussion about weight loss, diet, and exercise Recommended diet heavy in fruits and veggies and low in animal meats, cheeses, and dairy products, appropriate calorie intake Discussed ideal weight for height Patient on phentermine/topamax with benefit and no SE, currently taking drug break; continue close follow up.  Down 7lbs since last OV> Add exercise Will follow up in 3 months  Vitamin D Def At goal, continue supplementation Goal of 60-100 Defer checking vitamin D level  Gout He reports improved with lifestyle, not taking allopurinol  Diet discussed Check uric acid as needed  Continue diet and meds as discussed. Further disposition pending results of labs. Discussed med's effects and SE's.   Over 30 minutes of face to face interview, exam, counseling, chart review, and critical decision making was performed.   Future Appointments  Date Time Provider [place]  08/12/2020 11:00 AM [name], MD  [facility] None    ----------------------------------------------------------------------------------------------------------------------  HPI 59 y.o. male  presents for 3 month follow up on hypertension, cholesterol, diabetes with CKD, morbid obesity, gout and vitamin D deficiency.   Reports he is having left arm pain that is chronic in nature.  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 He has taken an bASA ocasonally.  He does have history of stomach ulcer after taking naproxen multiple times a day for a previous knee injury.    he is prescribed phentermine for weight loss, taking a drug break   They deny palpitations, anxiety, trouble sleeping, elevated BP.  Discussed restarting after first of year.   BMI is Body mass index is 35.8 kg/m., he is working on diet right now, but knows he needs to do better, needs to move more, does eat a whole lot but does admit to a lot of meat  Wt Readings from Last 3 Encounters:  05/11/20 264 lb (119.7 kg)  02/10/20 261 lb (118.4 kg)  11/29/19 260 lb (117.9 kg)   He reports when he takes meds BP at home is good (can't recall numbers), admits hasn't taken BP medication, today their BP is BP: 128/86 Has picked up pill dispenser to help with compliance/consistency   He does not workout. He denies chest pain, shortness of breath, dizziness.   He has not been working on diet and exercise for T2 diabetes CKD stage 2 he is on losartan/HCTZ Hyperlipidemia  at LDL not to goal metformin 500 mg BID but admits forgets to take some in the evening  denies foot ulcerations, hyperglycemia, increased  appetite, nausea, paresthesia of the feet, polydipsia, polyuria, visual disturbances, vomiting and weight loss.  He does not check sugars.  Does have a machine , plans to start.   Last A1C in the office was:  Lab Results  Component Value Date   HGBA1C 10.5 (H) 02/10/2020    He has CKD borderline  stage 1/2 associated with T2DM monitored at this office:  Lab Results  Component Value Date   GFRNONAA 94 02/10/2020   Lab Results  Component Value Date   CHOL 192 02/10/2020   HDL 40 02/10/2020   LDLCALC 107 (H) 02/10/2020   TRIG 337 (H) 02/10/2020   CHOLHDL 4.8 02/10/2020   Patient is on Vitamin D supplement and at goal at last check:   Lab Results  Component Value Date   VD25OH 55 02/10/2020     Patient is prescribed allopurinol for gout but doesn't take, and does not report a recent flare.  Lab Results  Component Value Date   LABURIC 6.7 02/10/2020   He has a history of testosterone deficiency and is on zinc supplement.  Lab Results  Component Value Date   TESTOSTERONE 185 (L) 07/23/2019   Hx of LFT elevations, did have negative hepatitis panel in 2015, recent normal iron in 07/2019, never had [place]  Lab Results  Component Value Date   ALT 48 (H) 02/10/2020   AST 27 02/10/2020   ALKPHOS 52 10/14/2016   BILITOT 1.1 02/10/2020      Current Medications:  Current Outpatient Medications on File Prior to Visit  Medication Sig  . allopurinol (ZYLOPRIM) 300 MG tablet Take 1 tablet Daily to prevent Gout  . aspirin 81 MG tablet Take 81 mg by mouth daily. Reported on 09/23/2015  . Blood Glucose Monitoring Suppl (FREESTYLE LITE) DEVI 1 Device by Does not apply route 3 (three) times daily.  . Cholecalciferol (VITAMIN D PO) Take 10,000 Units by mouth daily.  Marland Kitchen glipiZIDE (GLUCOTROL) 5 MG tablet TAKE 1 TABLET 3 TIMES A DAY WITH MEALS FOR DIABETES  . glucose blood (FREESTYLE LITE) test strip Use as instructed  . Lancets (FREESTYLE) lancets Use as instructed  . losartan-hydrochlorothiazide (HYZAAR) 100-12.5 MG tablet Take 1 tablet daily for blood pressure.  . metFORMIN (GLUCOPHAGE-XR) 500 MG 24 hr tablet TAKE 2 TABLETS 2 TIMES A DAY WITH MEALS FOR DIABETES  . OVER THE COUNTER MEDICATION Takes a MVI.   Marland Kitchen phentermine (ADIPEX-P) 37.5 MG tablet Take 1/2 to 1 tablet every morning for  Dieting & Weight  Loss (Patient not taking: Reported on 11/29/2019)  . Semaglutide,0.25 or 0.5MG /DOS, (OZEMPIC, 0.25 OR 0.5 MG/DOSE,) 2 MG/1.5ML SOPN Inject      0.375  ml (0.5 mg)     into skin every 7 days  . topiramate (TOPAMAX) 50 MG tablet Take 1/2 to 1 tablet 2 x /day at Suppertime & Bedtime for Dieting & Weight Loss (Patient not taking: Reported on 11/29/2019)  . Zinc 50 MG TABS Take 1 tablet Daily   No current facility-administered medications on file prior to visit.     Allergies:  Allergies  Allergen Reactions  . Codeine Nausea And Vomiting  . Nsaids Other (See Comments)    Bleeding ulcer 2013     Medical History:  Past Medical History:  Diagnosis Date  . Allergy    seasonal  . Arthritis   . Blood transfusion 09/2011   2 units  . Diabetes mellitus without complication ([place])   . History of adenomatous polyp of colon 10/07/2019  Due 10/03/2022 per Dr. Henrene Pastor  . Hyperlipidemia   . Hypertension   . Renal disorder   . Stomach ulcer    Family history- Reviewed and unchanged Social history- Reviewed and unchanged   Review of Systems:  Review of Systems  Constitutional: Negative for malaise/fatigue and weight loss.  HENT: Negative for hearing loss and tinnitus.   Eyes: Negative for blurred vision and double vision.  Respiratory: Negative for cough, shortness of breath and wheezing.   Cardiovascular: Negative for chest pain, palpitations, orthopnea, claudication and leg swelling.  Gastrointestinal: Negative for abdominal pain, blood in stool, constipation, diarrhea, heartburn, melena, nausea and vomiting.  Genitourinary: Negative.   Musculoskeletal: Negative for joint pain and myalgias.  Skin: Negative for rash.  Neurological: Negative for dizziness, tingling, sensory change, weakness and headaches.  Endo/Heme/Allergies: Negative for polydipsia.  Psychiatric/Behavioral: Negative.   All other systems reviewed and are negative.    Physical Exam: BP 128/86   Pulse 92    Temp 97.7 F (36.5 C)   Wt 264 lb (119.7 kg)   SpO2 98%   BMI 35.80 kg/m  Wt Readings from Last 3 Encounters:  05/11/20 264 lb (119.7 kg)  02/10/20 261 lb (118.4 kg)  11/29/19 260 lb (117.9 kg)   General Appearance: Well nourished, in no apparent distress. Eyes: PERRLA, EOMs, conjunctiva no swelling or erythema Sinuses: No Frontal/maxillary tenderness ENT/Mouth: Ext aud canals clear, TMs without erythema, bulging. No erythema, swelling, or exudate on post pharynx.  Tonsils not swollen or erythematous. Hearing normal.  Neck: Supple, thyroid normal.  Respiratory: Respiratory effort normal, BS equal bilaterally without rales, rhonchi, wheezing or stridor.  Cardio: RRR with no MRGs. Brisk peripheral pulses without edema.  Abdomen: Soft, + BS.  Non tender, no guarding, rebound, hernias, masses. Lymphatics: Non tender without lymphadenopathy.  Musculoskeletal: Full ROM, 5/5 strength, Normal gait Skin: Warm, dry without rashes, lesions, ecchymosis.  Neuro: Cranial nerves intact. No cerebellar symptoms.  Psych: Awake and oriented X 3, normal affect, Insight and Judgment appropriate.     Garnet Sierras, Laqueta Jean, DNP [facility] 05/11/2020  11:01 AM

## 2020-05-12 ENCOUNTER — Other Ambulatory Visit: Payer: Self-pay | Admitting: Adult Health Nurse Practitioner

## 2020-05-14 LAB — COMPLETE METABOLIC PANEL WITH GFR
AG Ratio: 1.4 (calc) (ref 1.0–2.5)
ALT: 54 U/L — ABNORMAL HIGH (ref 9–46)
AST: 37 U/L — ABNORMAL HIGH (ref 10–35)
Albumin: 4.3 g/dL (ref 3.6–5.1)
Alkaline phosphatase (APISO): 53 U/L (ref 35–144)
BUN: 15 mg/dL (ref 7–25)
CO2: 28 mmol/L (ref 20–32)
Calcium: 9.8 mg/dL (ref 8.6–10.3)
Chloride: 100 mmol/L (ref 98–110)
Creat: 1 mg/dL (ref 0.70–1.33)
GFR, Est African American: 95 mL/min/{1.73_m2} (ref >=60)
GFR, Est Non African American: 82 mL/min/{1.73_m2} (ref >=60)
Globulin: 3 g/dL (calc) (ref 1.9–3.7)
Glucose, Bld: 287 mg/dL — ABNORMAL HIGH (ref 65–99)
Potassium: 4.7 mmol/L (ref 3.5–5.3)
Sodium: 138 mmol/L (ref 135–146)
Total Bilirubin: 0.8 mg/dL (ref 0.2–1.2)
Total Protein: 7.3 g/dL (ref 6.1–8.1)

## 2020-05-14 LAB — LIPID PANEL
Cholesterol: 201 mg/dL — ABNORMAL HIGH (ref <200)
HDL: 41 mg/dL (ref >=40)
LDL Cholesterol (Calc): 122 mg/dL (calc) — ABNORMAL HIGH
Non-HDL Cholesterol (Calc): 160 mg/dL (calc) — ABNORMAL HIGH (ref <130)
Total CHOL/HDL Ratio: 4.9 (calc) (ref <5.0)
Triglycerides: 237 mg/dL — ABNORMAL HIGH (ref <150)

## 2020-05-14 LAB — CBC WITH DIFFERENTIAL/PLATELET
Absolute Monocytes: 485 cells/uL (ref 200–950)
Basophils Absolute: 50 cells/uL (ref 0–200)
Basophils Relative: 0.8 %
Eosinophils Absolute: 265 cells/uL (ref 15–500)
Eosinophils Relative: 4.2 %
HCT: 51.3 % — ABNORMAL HIGH (ref 38.5–50.0)
Hemoglobin: 17.8 g/dL — ABNORMAL HIGH (ref 13.2–17.1)
Lymphs Abs: 2375 cells/uL (ref 850–3900)
MCH: 32.2 pg (ref 27.0–33.0)
MCHC: 34.7 g/dL (ref 32.0–36.0)
MCV: 92.8 fL (ref 80.0–100.0)
MPV: 10.2 fL (ref 7.5–12.5)
Monocytes Relative: 7.7 %
Neutro Abs: 3125 cells/uL (ref 1500–7800)
Neutrophils Relative %: 49.6 %
Platelets: 211 10*3/uL (ref 140–400)
RBC: 5.53 10*6/uL (ref 4.20–5.80)
RDW: 11.9 % (ref 11.0–15.0)
Total Lymphocyte: 37.7 %
WBC: 6.3 10*3/uL (ref 3.8–10.8)

## 2020-05-14 LAB — HEMOGLOBIN A1C
Hgb A1c MFr Bld: 8.8 % of total Hgb — ABNORMAL HIGH (ref <5.7)
Mean Plasma Glucose: 206 mg/dL
eAG (mmol/L): 11.4 mmol/L

## 2020-05-14 LAB — FERRITIN: Ferritin: 543 ng/mL — ABNORMAL HIGH (ref 38–380)

## 2020-06-06 ENCOUNTER — Other Ambulatory Visit: Payer: Self-pay | Admitting: Adult Health

## 2020-06-06 MED ORDER — SULFAMETHOXAZOLE-TRIMETHOPRIM 800-160 MG PO TABS: 1.0000 | ORAL_TABLET | Freq: Two times a day (BID) | ORAL | 0 refills | Status: DC

## 2020-06-09 ENCOUNTER — Ambulatory Visit: Payer: Commercial Managed Care - PPO | Admitting: Internal Medicine

## 2020-06-23 ENCOUNTER — Ambulatory Visit: Payer: Commercial Managed Care - PPO | Admitting: Internal Medicine

## 2020-06-30 ENCOUNTER — Ambulatory Visit: Payer: Commercial Managed Care - PPO | Admitting: Internal Medicine

## 2020-06-30 ENCOUNTER — Other Ambulatory Visit: Payer: Self-pay

## 2020-06-30 VITALS — BP 142/90 | HR 89 | Temp 96.9°F | Resp 16 | Ht 72.0 in | Wt 258.2 lb

## 2020-06-30 DIAGNOSIS — N138 Other obstructive and reflux uropathy: Secondary | ICD-10-CM | POA: Diagnosis not present

## 2020-06-30 DIAGNOSIS — R972 Elevated prostate specific antigen [PSA]: Secondary | ICD-10-CM

## 2020-06-30 DIAGNOSIS — N401 Enlarged prostate with lower urinary tract symptoms: Secondary | ICD-10-CM | POA: Diagnosis not present

## 2020-06-30 NOTE — Progress Notes (Signed)
   History of Present Illness:      On Jan 8 , patient was suspected to have a prostatitis and was sent Rx Septra-DS  Bid for 1 month. He presents today reporting sx'of urgency and dysuria have improved.   Medications  .  glipiZIDE  5 MG tablet, TAKE 1 TABLET 3 TIMES A DAY  .  metFORMIN-XR 500 MG , TAKE 2 TABLETS 2 TIMES A DAY  .  Semaglutide,0.25 or 0.5MG /DOS, (OZEMPIC, 0.25 OR 0.5 MG/DOSE,) 2 MG/1.5ML SOPN, Inject  0.375 ml (0.5 mg)  into skin every 7 days  .  losartan-hydrochlorothiazide100-12.5 MG tablet, Take 1 tablet daily   .  allopurinol  300 MG tablet, Take 1 tablet Daily  .  aspirin 81 MG tablet, Take  daily. Reported on 09/23/2015 .  meloxicam  7.5 MG tablet, TAKE 1 TABLET DAILY AS NEEDED. Marland Kitchen  VITAMIN D , Take 10,000 Units daily. .  Takes a MVI.  Marland Kitchen  sulfamethoxazole-trimethoprim  800-160 MG, Take 1 tablet 2 times daily - completing .  Zinc 50 MG TABS, Take 1 tablet Daily  Problem list He has Hypertension; Hyperlipidemia associated with type 2 diabetes mellitus ([place]); Vitamin D deficiency; Medication management; Testosterone deficiency; Morbid obesity ([place]); GERD; T2_NIDDM ([place]); Gout; CKD stage 2 due to type 2 diabetes mellitus ([place]); Elevated LFTs; History of adenomatous polyp of colon; Hepatic steatosis; and Asymptomatic gallstones on their problem list.   Observations/Objective:  BP (!) 142/90   Pulse 89   Temp (!) 96.9 F (36.1 C)   Resp 16   Ht 6' (1.829 m)   Wt 258 lb 3.2 oz (117.1 kg)   SpO2 96%   BMI 35.02 kg/m   HEENT - WNL. Neck - supple.  Chest - Clear equal BS. Cor - Nl HS. RRR w/o sig MGR. PP 1(+). No edema. MS- FROM w/o deformities.  Gait Nl. GU- DRE finds Prostate 1= enlarged, firm, non-tender . Neg Hemacult Neuro -  Nl w/o focal abnormalities.  Assessment and Plan:  1. BPH with obstruction/lower urinary tract symptoms  - Urinalysis, Routine w reflex microscopic - Urine Culture - PSA  2. Elevated PSA  - PSA   Follow Up  Instructions:      I discussed the assessment and treatment plan with the patient. The patient was provided an opportunity to ask questions and all were answered. The patient agreed with the plan and demonstrated an understanding of the instructions.       The patient was advised to call back or seek an in-person evaluation if the symptoms worsen or if the condition fails to improve as anticipated.   Kirtland Bouchard, MD

## 2020-07-01 ENCOUNTER — Other Ambulatory Visit: Payer: Self-pay | Admitting: Internal Medicine

## 2020-07-01 LAB — URINE CULTURE
MICRO NUMBER:: 11482551
Result:: NO GROWTH
SPECIMEN QUALITY:: ADEQUATE

## 2020-07-01 LAB — URINALYSIS, ROUTINE W REFLEX MICROSCOPIC
Bilirubin Urine: NEGATIVE
Hgb urine dipstick: NEGATIVE
Ketones, ur: NEGATIVE
Leukocytes,Ua: NEGATIVE
Nitrite: NEGATIVE
Protein, ur: NEGATIVE
Specific Gravity, Urine: 1.029 (ref 1.001–1.03)
pH: 6.5 (ref 5.0–8.0)

## 2020-07-01 LAB — PSA: PSA: 0.45 ng/mL (ref <=4.0)

## 2020-07-01 MED ORDER — TAMSULOSIN HCL 0.4 MG PO CAPS: ORAL_CAPSULE | ORAL | 0 refills | Status: DC

## 2020-07-01 NOTE — Progress Notes (Signed)
========================================================== -   Test results slightly outside the reference range are not unusual. If there is anything important, I will review this with you,  otherwise it is considered normal test values.  If you have further questions,  please do not hesitate to contact me at the office or via My Chart.  ========================================================== ==========================================================  -  U/A -Normal - No Blood or White cells   ========================================================== ==========================================================  -  PSA - Normal   ========================================================== ==========================================================  -  Urine culture shows that there  is   NO Infection.   - Sent in Rx for Flomax (Tamsulosin) to help with bladder sx's  ========================================================== ==========================================================

## 2020-07-04 ENCOUNTER — Encounter: Payer: Self-pay | Admitting: Internal Medicine

## 2020-08-03 ENCOUNTER — Encounter: Payer: Commercial Managed Care - PPO | Admitting: Internal Medicine

## 2020-08-11 ENCOUNTER — Encounter: Payer: Self-pay | Admitting: Internal Medicine

## 2020-08-11 NOTE — Patient Instructions (Signed)

## 2020-08-11 NOTE — Progress Notes (Signed)
Annual  Screening/Preventative Visit  & Comprehensive Evaluation & Examination      This very nice 60 y.o. MWM  presents for a Screening /Preventative Visit & comprehensive evaluation and management of multiple medical co-morbidities.  Patient has been followed for HTN, HLD, T2_NIDDM  Prediabetes and Vitamin D Deficiency. Patient has hx/o Gout quiescent on his Allopurinol.      HTN predates circa 2001 . Patient's BP has not been monitored at home.  Phone call confirms patient has not been taking any BP meds ! Today's BP is not at goal - 144/104 and 156/96. Patient denies any cardiac symptoms as chest pain, palpitations, shortness of breath, dizziness or ankle swelling.      Patient's hyperlipidemia is controlled with diet and medications. Patient denies myalgias or other medication SE's. Last lipids were not at goal:  Lab Results  Component Value Date   CHOL 201 (H) 05/11/2020   HDL 41 05/11/2020   LDLCALC 122 (H) 05/11/2020   TRIG 237 (H) 05/11/2020   CHOLHDL 4.9 05/11/2020        Patient is on Metformin & Glipizide for T2_NIDDM (A1c 7.1% /2017) w/CKD2 (GFR 82) and patient denies reactive hypoglycemic symptoms, visual blurring, diabetic polys or paresthesias. Last A1c was not at goal:  Lab Results  Component Value Date   HGBA1C 8.8 (H) 05/11/2020        Patient has hx/o Testosterone deficiency and was sporadic with his testosterone injections & finally stopped for perceived lack of benefit.       Finally, patient has history of Vitamin D Deficiency ("20" /2008) and last vitamin D was not at goal (70-100):  Lab Results  Component Value Date   VD25OH 55 02/10/2020    Current Outpatient Medications on File Prior to Visit  Medication Sig  . FISH OIL OMEGA-3 P Take 1 capsule daily.  . Allopurinol  300 MG tablet Take 1 tablet Daily to prevent Gout  . aspirin 81 MG tablet Take  daily.  Marland Kitchen VITAMIN D Take 10,000 Units  daily.  Marland Kitchen glipiZIDE  5 MG tablet TAKE 1 TABLET 3 TIMES A  DAY   . Marland Kitchen meloxicam 7.5 MG tablet TAKE 1 TABLET DAILY AS NEEDED.  . metFORMIN-XR 500 MG  TAKE 2 TABLETS 2 TIMES A DAY   . tamsulosin  0.4 MG CAPS  Take  1 tablet  at Bedtime for Prostate  . Zinc 50 MG TABS Take 1 tablet Daily     Allergies  Allergen Reactions  . Codeine Nausea And Vomiting  . Nsaids Other (See Comments)    Bleeding ulcer 2013    Past Medical History:  Diagnosis Date  . Allergy    seasonal  . Arthritis   . Blood transfusion 09/2011   2 units  . Diabetes mellitus without complication ([place])   . History of adenomatous polyp of colon 10/07/2019   Due 10/03/2022 per Dr. Henrene Pastor  . Hyperlipidemia   . Hypertension   . Renal disorder   . Stomach ulcer     Health Maintenance  Topic Date Due  . COVID-19 Vaccine (1) Never done  . INFLUENZA VACCINE  Never done  . OPHTHALMOLOGY EXAM  06/07/2020  . FOOT EXAM  07/21/2020  . HEMOGLOBIN A1C  11/09/2020  . TETANUS/TDAP  10/09/2021  . COLONOSCOPY (Pts 45-38yrs Insurance coverage will need to be confirmed)  10/03/2022  . PNEUMOCOCCAL POLYSACCHARIDE VACCINE AGE 64-64 HIGH RISK  Completed  . Hepatitis C Screening  Completed  . HIV Screening  Completed  . HPV VACCINES  Aged Out    Immunization History  Administered Date(s) Administered  . PPD Test 01/02/2014, 03/11/2015, 03/31/2016, 05/03/2017, 06/04/2018, 07/23/2019  . Pneumococcal Polysaccharide-23 10/04/2010, 05/03/2017  . Tdap 10/10/2011    Last Colon - 10/03/2019 - Dr Henrene Pastor recc 3 year f/u due May 2024.  Past Surgical History:  Procedure Laterality Date  . COLONOSCOPY    . ESOPHAGOGASTRODUODENOSCOPY  10/26/2011   Procedure: ESOPHAGOGASTRODUODENOSCOPY (EGD);  Surgeon: Irene Shipper, MD;  Location: [facility] ENDOSCOPY;  Service: Endoscopy;  Laterality: N/A;  . FRACTURE SURGERY  broken collar bone  . POLYPECTOMY    . UPPER GASTROINTESTINAL ENDOSCOPY      Family History  Adopted: Yes  Problem Relation Age of Onset  . Breast cancer Mother   . Colon cancer Neg Hx   .  Colon polyps Neg Hx   . Esophageal cancer Neg Hx   . Rectal cancer Neg Hx   . Stomach cancer Neg Hx     Social History   Socioeconomic History  . Marital status: Married    Spouse name: Marita [place]  . Number of children: 1 son   Occupational History  . Sales  Tobacco Use  . Smoking status: Never Smoker  . Smokeless tobacco: Never Used  Substance and Sexual Activity  . Alcohol use: Yes    Alcohol/week: 1.0 standard drink    Types: 1 Cans of beer per week    Comment: occasionally   . Drug use: No  . Sexual activity: Not on file    ROS Constitutional: Denies fever, chills, weight loss/gain, headaches, insomnia,  night sweats or change in appetite. Does c/o fatigue. Eyes: Denies redness, blurred vision, diplopia, discharge, itchy or watery eyes.  ENT: Denies discharge, congestion, post nasal drip, epistaxis, sore throat, earache, hearing loss, dental pain, Tinnitus, Vertigo, Sinus pain or snoring.  Cardio: Denies chest pain, palpitations, irregular heartbeat, syncope, dyspnea, diaphoresis, orthopnea, PND, claudication or edema Respiratory: denies cough, dyspnea, DOE, pleurisy, hoarseness, laryngitis or wheezing.  Gastrointestinal: Denies dysphagia, heartburn, reflux, water brash, pain, cramps, nausea, vomiting, bloating, diarrhea, constipation, hematemesis, melena, hematochezia, jaundice or hemorrhoids Genitourinary: Denies dysuria, frequency, urgency, nocturia, hesitancy, discharge, hematuria or flank pain Musculoskeletal: Denies arthralgia, myalgia, stiffness, Jt. Swelling, pain, limp or strain/sprain. Denies Falls. Skin: Denies puritis, rash, hives, warts, acne, eczema or change in skin lesion Neuro: No weakness, tremor, incoordination, spasms, paresthesia or pain Psychiatric: Denies confusion, memory loss or sensory loss. Denies Depression. Endocrine: Denies change in weight, skin, hair change, nocturia, and paresthesia, diabetic polys, visual blurring or hyper / hypo glycemic  episodes.  Heme/Lymph: No excessive bleeding, bruising or enlarged lymph nodes.  Physical Exam  BP (!) 144/104   Pulse 75   Temp 97.9 F (36.6 C)   Resp 16   Ht 5' 11.5" (1.816 m)   Wt 253 lb 12.8 oz (115.1 kg)   SpO2 99%   BMI 34.90 kg/m   General Appearance: Well nourished and well groomed and in no apparent distress.  Eyes: PERRLA, EOMs, conjunctiva no swelling or erythema, normal fundi and vessels. Sinuses: No frontal/maxillary tenderness ENT/Mouth: EACs patent / TMs  nl. Nares clear without erythema, swelling, mucoid exudates. Oral hygiene is good. No erythema, swelling, or exudate. Tongue normal, non-obstructing. Tonsils not swollen or erythematous. Hearing normal.  Neck: Supple, thyroid not palpable. No bruits, nodes or JVD. Respiratory: Respiratory effort normal.  BS equal and clear bilateral without rales, rhonci, wheezing or stridor. Cardio: Heart sounds are normal with regular rate  and rhythm and no murmurs, rubs or gallops. Peripheral pulses are normal and equal bilaterally without edema. No aortic or femoral bruits. Chest: symmetric with normal excursions and percussion.  Abdomen: Soft, with Nl bowel sounds. Nontender, no guarding, rebound, hernias, masses, or organomegaly.  Lymphatics: Non tender without lymphadenopathy.  Musculoskeletal: Full ROM all peripheral extremities, joint stability, 5/5 strength, and normal gait. Skin: Warm and dry without rashes, lesions, cyanosis, clubbing or  ecchymosis.  Neuro: Cranial nerves intact, reflexes equal bilaterally. Normal muscle tone, no cerebellar symptoms. Sensation intact.  Pysch: Alert and oriented X 3 with normal affect, insight and judgment appropriate.   Assessment and Plan  1. Annual Preventative/Screening Exam    2. Essential hypertension  - EKG 12-Lead - [place], RETROPERITNL ABD,  LTD - Urinalysis, Routine w reflex microscopic - Microalbumin / creatinine urine ratio - CBC with Differential/Platelet - COMPLETE  METABOLIC PANEL WITH GFR - Magnesium - TSH  3. Hyperlipidemia associated with type 2 diabetes mellitus ([place])  - EKG 12-Lead - [place], RETROPERITNL ABD,  LTD - Lipid panel - TSH  4. 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 Vitamin D deficiency  - VITAMIN D 25 Hydroxy   6. Idiopathic gout  - Uric acid  7. OSA on CPAP   8. Testosterone deficiency  - Testosterone  9. BPH with obstruction/lower urinary tract symptoms  - PSA  10. Class 2 severe obesity due to excess calories with serious  comorbidity and body mass index (BMI) of 38.0 to 38.9 in adult ([place])   11. Screening for colorectal cancer  - POC Hemoccult Bld/Stl   12. Screening examination for pulmonary tuberculosis  - TB Skin Test  13. Prostate cancer screening  - PSA  14. Screening for ischemic heart disease  - EKG 12-Lead  15. FH: hypertension  - EKG 12-Lead - [place], RETROPERITNL ABD,  LTD  16. Screening for AAA (aortic abdominal aneurysm)  - [place], RETROPERITNL ABD,  LTD  17. Fatigue, unspecified type  - Iron,Total/Total Iron Binding Cap - Vitamin B12 - CBC with Differential/Platelet - TSH  18. Medication management  - Urinalysis, Routine w reflex microscopic - Microalbumin / creatinine urine ratio - Testosterone - Uric acid - CBC with Differential/Platelet - COMPLETE METABOLIC PANEL WITH GFR - Magnesium - Lipid panel - TSH - Hemoglobin A1c - Insulin, random - VITAMIN D 25 Hydroxy           Patient was counseled in prudent diet, weight control to achieve/maintain BMI less than 25, BP monitoring, regular exercise and medications as discussed.  Discussed med effects and SE's. Routine screening  labs and tests as requested with regular follow-up as recommended. Over 40 minutes of exam, counseling, chart review and high complex critical decision making was performed   Kirtland Bouchard, MD

## 2020-08-12 ENCOUNTER — Ambulatory Visit: Payer: Commercial Managed Care - PPO | Admitting: Internal Medicine

## 2020-08-12 ENCOUNTER — Other Ambulatory Visit: Payer: Self-pay

## 2020-08-12 ENCOUNTER — Encounter: Payer: Self-pay | Admitting: Internal Medicine

## 2020-08-12 VITALS — BP 144/104 | HR 75 | Temp 97.9°F | Resp 16 | Ht 71.5 in | Wt 253.8 lb

## 2020-08-12 DIAGNOSIS — Z0001 Encounter for general adult medical examination with abnormal findings: Secondary | ICD-10-CM

## 2020-08-12 DIAGNOSIS — Z136 Encounter for screening for cardiovascular disorders: Secondary | ICD-10-CM

## 2020-08-12 DIAGNOSIS — Z1211 Encounter for screening for malignant neoplasm of colon: Secondary | ICD-10-CM

## 2020-08-12 DIAGNOSIS — N138 Other obstructive and reflux uropathy: Secondary | ICD-10-CM

## 2020-08-12 DIAGNOSIS — M1000 Idiopathic gout, unspecified site: Secondary | ICD-10-CM

## 2020-08-12 DIAGNOSIS — N182 Chronic kidney disease, stage 2 (mild): Secondary | ICD-10-CM

## 2020-08-12 DIAGNOSIS — Z9114 Patient's other noncompliance with medication regimen: Secondary | ICD-10-CM

## 2020-08-12 DIAGNOSIS — Z79899 Other long term (current) drug therapy: Secondary | ICD-10-CM

## 2020-08-12 DIAGNOSIS — E1169 Type 2 diabetes mellitus with other specified complication: Secondary | ICD-10-CM

## 2020-08-12 DIAGNOSIS — I10 Essential (primary) hypertension: Secondary | ICD-10-CM | POA: Diagnosis not present

## 2020-08-12 DIAGNOSIS — N401 Enlarged prostate with lower urinary tract symptoms: Secondary | ICD-10-CM

## 2020-08-12 DIAGNOSIS — Z111 Encounter for screening for respiratory tuberculosis: Secondary | ICD-10-CM | POA: Diagnosis not present

## 2020-08-12 DIAGNOSIS — Z0000 Encounter for general adult medical examination without abnormal findings: Secondary | ICD-10-CM | POA: Diagnosis not present

## 2020-08-12 DIAGNOSIS — E1122 Type 2 diabetes mellitus with diabetic chronic kidney disease: Secondary | ICD-10-CM

## 2020-08-12 DIAGNOSIS — E559 Vitamin D deficiency, unspecified: Secondary | ICD-10-CM

## 2020-08-12 DIAGNOSIS — R5383 Other fatigue: Secondary | ICD-10-CM

## 2020-08-12 DIAGNOSIS — Z8249 Family history of ischemic heart disease and other diseases of the circulatory system: Secondary | ICD-10-CM

## 2020-08-12 DIAGNOSIS — Z1212 Encounter for screening for malignant neoplasm of rectum: Secondary | ICD-10-CM

## 2020-08-12 DIAGNOSIS — Z125 Encounter for screening for malignant neoplasm of prostate: Secondary | ICD-10-CM

## 2020-08-12 DIAGNOSIS — E349 Endocrine disorder, unspecified: Secondary | ICD-10-CM

## 2020-08-12 DIAGNOSIS — G4733 Obstructive sleep apnea (adult) (pediatric): Secondary | ICD-10-CM

## 2020-08-12 MED ORDER — OLMESARTAN MEDOXOMIL-HCTZ 20-12.5 MG PO TABS: ORAL_TABLET | ORAL | 1 refills | Status: DC

## 2020-08-12 NOTE — Progress Notes (Signed)
AortaScan < 3 cm. Within normal limits, per Dr McKeown. 

## 2020-08-13 ENCOUNTER — Other Ambulatory Visit: Payer: Self-pay | Admitting: Internal Medicine

## 2020-08-13 DIAGNOSIS — E1169 Type 2 diabetes mellitus with other specified complication: Secondary | ICD-10-CM

## 2020-08-13 DIAGNOSIS — E785 Hyperlipidemia, unspecified: Secondary | ICD-10-CM

## 2020-08-13 LAB — CBC WITH DIFFERENTIAL/PLATELET
Absolute Monocytes: 371 cells/uL (ref 200–950)
Basophils Absolute: 52 cells/uL (ref 0–200)
Basophils Relative: 0.9 %
Eosinophils Absolute: 151 cells/uL (ref 15–500)
Eosinophils Relative: 2.6 %
HCT: 51.8 % — ABNORMAL HIGH (ref 38.5–50.0)
Hemoglobin: 17.3 g/dL — ABNORMAL HIGH (ref 13.2–17.1)
Lymphs Abs: 2053 cells/uL (ref 850–3900)
MCH: 31.6 pg (ref 27.0–33.0)
MCHC: 33.4 g/dL (ref 32.0–36.0)
MCV: 94.5 fL (ref 80.0–100.0)
MPV: 10.6 fL (ref 7.5–12.5)
Monocytes Relative: 6.4 %
Neutro Abs: 3173 cells/uL (ref 1500–7800)
Neutrophils Relative %: 54.7 %
Platelets: 206 10*3/uL (ref 140–400)
RBC: 5.48 10*6/uL (ref 4.20–5.80)
RDW: 11.9 % (ref 11.0–15.0)
Total Lymphocyte: 35.4 %
WBC: 5.8 10*3/uL (ref 3.8–10.8)

## 2020-08-13 LAB — URINALYSIS, ROUTINE W REFLEX MICROSCOPIC
Bacteria, UA: NONE SEEN /HPF
Bilirubin Urine: NEGATIVE
Hyaline Cast: NONE SEEN /LPF
Ketones, ur: NEGATIVE
Leukocytes,Ua: NEGATIVE
Nitrite: NEGATIVE
Protein, ur: NEGATIVE
RBC / HPF: NONE SEEN /HPF (ref 0–2)
Specific Gravity, Urine: 1.034 (ref 1.001–1.03)
Squamous Epithelial / HPF: NONE SEEN /HPF (ref <=5)
WBC, UA: NONE SEEN /HPF (ref 0–5)
pH: 5.5 (ref 5.0–8.0)

## 2020-08-13 LAB — COMPLETE METABOLIC PANEL WITH GFR
AG Ratio: 1.5 (calc) (ref 1.0–2.5)
ALT: 48 U/L — ABNORMAL HIGH (ref 9–46)
AST: 34 U/L (ref 10–35)
Albumin: 4.5 g/dL (ref 3.6–5.1)
Alkaline phosphatase (APISO): 66 U/L (ref 35–144)
BUN: 14 mg/dL (ref 7–25)
CO2: 28 mmol/L (ref 20–32)
Calcium: 10 mg/dL (ref 8.6–10.3)
Chloride: 98 mmol/L (ref 98–110)
Creat: 1.02 mg/dL (ref 0.70–1.33)
GFR, Est African American: 93 mL/min/{1.73_m2} (ref >=60)
GFR, Est Non African American: 80 mL/min/{1.73_m2} (ref >=60)
Globulin: 3.1 g/dL (calc) (ref 1.9–3.7)
Glucose, Bld: 467 mg/dL — ABNORMAL HIGH (ref 65–99)
Potassium: 4.5 mmol/L (ref 3.5–5.3)
Sodium: 135 mmol/L (ref 135–146)
Total Bilirubin: 1.1 mg/dL (ref 0.2–1.2)
Total Protein: 7.6 g/dL (ref 6.1–8.1)

## 2020-08-13 LAB — PSA: PSA: 0.32 ng/mL (ref <=4.0)

## 2020-08-13 LAB — IRON, TOTAL/TOTAL IRON BINDING CAP
%SAT: 41 % (calc) (ref 20–48)
Iron: 136 ug/dL (ref 50–180)
TIBC: 333 mcg/dL (calc) (ref 250–425)

## 2020-08-13 LAB — URIC ACID: Uric Acid, Serum: 7.3 mg/dL (ref 4.0–8.0)

## 2020-08-13 LAB — TESTOSTERONE: Testosterone: 228 ng/dL — ABNORMAL LOW (ref 250–827)

## 2020-08-13 LAB — VITAMIN B12: Vitamin B-12: 497 pg/mL (ref 200–1100)

## 2020-08-13 LAB — MICROALBUMIN / CREATININE URINE RATIO
Creatinine, Urine: 32 mg/dL (ref 20–320)
Microalb Creat Ratio: 19 mcg/mg creat (ref <30)
Microalb, Ur: 0.6 mg/dL

## 2020-08-13 LAB — LIPID PANEL
Cholesterol: 196 mg/dL (ref <200)
HDL: 38 mg/dL — ABNORMAL LOW (ref >=40)
LDL Cholesterol (Calc): 106 mg/dL (calc) — ABNORMAL HIGH
Non-HDL Cholesterol (Calc): 158 mg/dL (calc) — ABNORMAL HIGH (ref <130)
Total CHOL/HDL Ratio: 5.2 (calc) — ABNORMAL HIGH (ref <5.0)
Triglycerides: 387 mg/dL — ABNORMAL HIGH (ref <150)

## 2020-08-13 LAB — VITAMIN D 25 HYDROXY (VIT D DEFICIENCY, FRACTURES): Vit D, 25-Hydroxy: 54 ng/mL (ref 30–100)

## 2020-08-13 LAB — TSH: TSH: 2.07 mIU/L (ref 0.40–4.50)

## 2020-08-13 LAB — MAGNESIUM: Magnesium: 1.9 mg/dL (ref 1.5–2.5)

## 2020-08-13 LAB — INSULIN, RANDOM: Insulin: 64 u[IU]/mL — ABNORMAL HIGH

## 2020-08-13 LAB — HEMOGLOBIN A1C
Hgb A1c MFr Bld: 11.9 % of total Hgb — ABNORMAL HIGH (ref <5.7)
Mean Plasma Glucose: 295 mg/dL
eAG (mmol/L): 16.3 mmol/L

## 2020-08-13 MED ORDER — ROSUVASTATIN CALCIUM 10 MG PO TABS: ORAL_TABLET | ORAL | 1 refills | Status: DC

## 2020-08-14 ENCOUNTER — Other Ambulatory Visit: Payer: Self-pay

## 2020-08-14 ENCOUNTER — Ambulatory Visit (INDEPENDENT_AMBULATORY_CARE_PROVIDER_SITE_OTHER): Payer: Commercial Managed Care - PPO | Admitting: *Deleted

## 2020-08-14 VITALS — BP 142/100 | HR 88 | Temp 97.9°F | Wt 253.0 lb

## 2020-08-14 DIAGNOSIS — E1122 Type 2 diabetes mellitus with diabetic chronic kidney disease: Secondary | ICD-10-CM | POA: Diagnosis not present

## 2020-08-14 DIAGNOSIS — N182 Chronic kidney disease, stage 2 (mild): Secondary | ICD-10-CM

## 2020-08-14 LAB — TB SKIN TEST
Induration: 0 mm
TB Skin Test: NEGATIVE

## 2020-08-14 NOTE — Progress Notes (Signed)
Patient is here to recheck blood sugar and review medications. Patient brought medication bottles and compared to medication list. States he started new medications today, which are Rosuvastatin and Olmesartan-HCTZ. Glucose check in the office was 282. Patient advised to check blood sugar before breakfast and supper and keep a list. Patient will call with his reading in 1 week. Patient received a medication list and was advised to take all medication as directed on the list. Patient scheduled a 1 month follow up office visit with [name], NP.

## 2020-08-17 ENCOUNTER — Other Ambulatory Visit: Payer: Self-pay | Admitting: *Deleted

## 2020-08-17 DIAGNOSIS — E119 Type 2 diabetes mellitus without complications: Secondary | ICD-10-CM

## 2020-08-17 MED ORDER — FREESTYLE LITE TEST VI STRP: ORAL_STRIP | 12 refills | Status: AC

## 2020-09-18 ENCOUNTER — Ambulatory Visit: Payer: Commercial Managed Care - PPO | Admitting: Adult Health

## 2020-11-04 ENCOUNTER — Ambulatory Visit: Payer: Commercial Managed Care - PPO | Admitting: Adult Health

## 2020-11-12 ENCOUNTER — Ambulatory Visit: Payer: Commercial Managed Care - PPO | Admitting: Adult Health

## 2021-01-06 NOTE — Progress Notes (Deleted)
FOLLOW UP  Assessment and Plan:   Hypertension Elevated; off of meds, start using pill box for consistency *** Monitor blood pressure at home; patient to call if consistently greater than 130/80 Continue DASH diet.   Reminder to go to the ER if any CP, SOB, nausea, dizziness, severe HA, changes vision/speech, left arm numbness and tingling and jaw pain. Follow up recheck 1 month   Cholesterol Currently at goal; continue lipitor 40 mg Continue low cholesterol diet and exercise.  Check lipid panel.   Diabetes with diabetic chronic kidney disease Continue medication: start back on metformin will do 750 BID *** Also will start ozempic - sample given, 0.25 mg weekly x 4 weeks, then if tolerating will send in 0.5 mg weekly  Sent in glucometer and supplies to start checking fasting glucose daily  Continue diet and exercise.  Perform daily foot/skin check, notify office of any concerning changes.  Check A1C  Morbid Obesity with co morbidities Long discussion about weight loss, diet, and exercise Recommended diet heavy in fruits and veggies and low in animal meats, cheeses, and dairy products, appropriate calorie intake Discussed ideal weight for height Patient on phentermine/topamax with benefit and no SE, currently taking drug break; continue close follow up.  Will try adding ozempic,  Add exercise Will follow up in 3 months  Vitamin D Def At goal, continue supplementation Goal of 60-100 Defer checking vitamin D level  Gout He reports improved with lifestyle, not taking allopurinol *** Diet discussed Check uric acid as needed  Continue diet and meds as discussed. Further disposition pending results of labs. Discussed med's effects and SE's.   Over 30 minutes of exam, counseling, chart review, and critical decision making was performed.   Future Appointments  Date Time Provider [place]  01/07/2021  8:45 AM [name], NP [facility] None  08/19/2021 11:00 AM  [name], MD [facility] None    ----------------------------------------------------------------------------------------------------------------------  HPI 60 y.o. male  presents for 3 month follow up on hypertension, cholesterol, diabetes with CKD, morbid obesity, gout and vitamin D deficiency.   he is prescribed phentermine for weight loss, taking a drug break  While on the medication they have lost 8 lbs since last visit. They deny palpitations, anxiety, trouble sleeping, elevated BP.   BMI is There is no height or weight on file to calculate BMI., he is working on diet right now, but knows he needs to do better, needs to move more, does eat a whole lot but does admit to a lot of meat  Wt Readings from Last 3 Encounters:  08/14/20 253 lb (114.8 kg)  08/12/20 253 lb 12.8 oz (115.1 kg)  06/30/20 258 lb 3.2 oz (117.1 kg)   He reports when he takes meds BP at home is good (can't recall numbers), admits hasn't taken BP medication, today their BP is   Has picked up pill dispenser to help with compliance/consistency   He does not workout. He denies chest pain, shortness of breath, dizziness.   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  He does not check sugars.  Does have a machine , plans to start.  Last A1C in the office was:  Lab Results  Component Value Date   HGBA1C 11.9 (H) 08/12/2020   He has CKD borderline stage 1/2 associated with T2DM monitored at this office:  Lab Results  Component Value Date   GFRNONAA 80 08/12/2020   Lab Results  Component Value Date   CHOL 196 08/12/2020   HDL 38 (L) 08/12/2020   LDLCALC 106 (H) 08/12/2020   TRIG 387 (H)  08/12/2020   CHOLHDL 5.2 (H) 08/12/2020   Patient is on Vitamin D supplement and at goal at last check:   Lab Results  Component Value Date   VD25OH 54 08/12/2020     Patient is prescribed allopurinol for gout but doesn't take ***, and does not report a recent flare.  Lab Results  Component Value Date   LABURIC 7.3 08/12/2020   He has a history of testosterone deficiency and is on zinc supplement.  Lab Results  Component Value Date   TESTOSTERONE 228 (L) 08/12/2020   Hx of LFT elevations, did have negative hepatitis panel in 2015, hepatic steatosis per [place] 11/2019.  Lab Results  Component Value Date   ALT 48 (H) 08/12/2020   AST 34 08/12/2020   ALKPHOS 52 10/14/2016   BILITOT 1.1 08/12/2020      Current Medications:  Current Outpatient Medications on File Prior to Visit  Medication Sig   rosuvastatin (CRESTOR) 10 MG tablet Take  1 tablet  Daily  for Cholesterol   allopurinol (ZYLOPRIM) 300 MG tablet Take 1 tablet Daily to prevent Gout   aspirin 81 MG tablet Take 81 mg by mouth daily. Reported on 09/23/2015   Blood Glucose Monitoring Suppl (FREESTYLE LITE) DEVI 1 Device by Does not apply route 3 (three) times daily.   Cholecalciferol (VITAMIN D PO) Take 10,000 Units by mouth daily.   glipiZIDE (GLUCOTROL) 5 MG tablet TAKE 1 TABLET 3 TIMES A DAY WITH MEALS FOR DIABETES   glucose blood (FREESTYLE LITE) test strip Check blood sugar 1 to 2 times daily. DX-E11.9   Lancets (FREESTYLE) lancets Use as instructed   meloxicam (MOBIC) 7.5 MG tablet TAKE 1 TABLET (7.5 MG TOTAL) BY MOUTH DAILY FOR TWO WEEKS, THEN AS NEEDED.   metFORMIN (GLUCOPHAGE-XR) 500 MG 24 hr tablet TAKE 2 TABLETS 2 TIMES A DAY WITH MEALS FOR DIABETES   olmesartan-hydrochlorothiazide (BENICAR HCT) 20-12.5 MG tablet Take  1 tablet  Daily  for BP   Omega-3 Fatty Acids (FISH OIL OMEGA-3 PO) Take 1 capsule by mouth daily.   tamsulosin (FLOMAX) 0.4 MG CAPS capsule Take  1 tablet  at Bedtime for Prostate   Zinc 50 MG TABS  Take 1 tablet Daily   No current facility-administered medications on file prior to visit.     Allergies:  Allergies  Allergen Reactions   Codeine Nausea And Vomiting   Nsaids Other (See Comments)    Bleeding ulcer 2013     Medical History:  Past Medical History:  Diagnosis Date   Allergy    seasonal   Arthritis    Blood transfusion 09/2011   2 units   Diabetes mellitus without complication ([place])    History of adenomatous polyp of colon 10/07/2019   Due 10/03/2022 per Dr. Henrene Pastor   Hyperlipidemia    Hypertension    Renal disorder    Stomach ulcer    Family history- Reviewed and unchanged Social history- Reviewed and unchanged   Review of Systems:  Review of Systems  Constitutional:  Negative for malaise/fatigue and weight loss.  HENT:  Negative for hearing loss and  tinnitus.   Eyes:  Negative for blurred vision and double vision.  Respiratory:  Negative for cough, shortness of breath and wheezing.   Cardiovascular:  Negative for chest pain, palpitations, orthopnea, claudication and leg swelling.  Gastrointestinal:  Negative for abdominal pain, blood in stool, constipation, diarrhea, heartburn, melena, nausea and vomiting.  Genitourinary: Negative.   Musculoskeletal:  Negative for joint pain and myalgias.  Skin:  Negative for rash.  Neurological:  Negative for dizziness, tingling, sensory change, weakness and headaches.  Endo/Heme/Allergies:  Negative for polydipsia.  Psychiatric/Behavioral: Negative.    All other systems reviewed and are negative.   Physical Exam: There were no vitals taken for this visit. Wt Readings from Last 3 Encounters:  08/14/20 253 lb (114.8 kg)  08/12/20 253 lb 12.8 oz (115.1 kg)  06/30/20 258 lb 3.2 oz (117.1 kg)   General Appearance: Well nourished, in no apparent distress. Eyes: PERRLA, EOMs, conjunctiva no swelling or erythema Sinuses: No Frontal/maxillary tenderness ENT/Mouth: Ext aud canals clear, TMs without erythema, bulging. No  erythema, swelling, or exudate on post pharynx.  Tonsils not swollen or erythematous. Hearing normal.  Neck: Supple, thyroid normal.  Respiratory: Respiratory effort normal, BS equal bilaterally without rales, rhonchi, wheezing or stridor.  Cardio: RRR with no MRGs. Brisk peripheral pulses without edema.  Abdomen: Soft, + BS.  Non tender, no guarding, rebound, hernias, masses. Lymphatics: Non tender without lymphadenopathy.  Musculoskeletal: Full ROM, 5/5 strength, Normal gait Skin: Warm, dry without rashes, lesions, ecchymosis.  Neuro: Cranial nerves intact. No cerebellar symptoms.  Psych: Awake and oriented X 3, normal affect, Insight and Judgment appropriate.    Izora Ribas, NP 9:07 AM [facility]

## 2021-01-07 ENCOUNTER — Ambulatory Visit: Payer: Commercial Managed Care - PPO | Admitting: Adult Health

## 2021-01-07 NOTE — Progress Notes (Deleted)
FOLLOW UP  Assessment and Plan:   Hypertension Elevated; off of meds, start using pill box for consistency Monitor blood pressure at home; patient to call if consistently greater than 130/80 Continue DASH diet.   Reminder to go to the ER if any CP, SOB, nausea, dizziness, severe HA, changes vision/speech, left arm numbness and tingling and jaw pain. Follow up recheck 1 month   Cholesterol Currently at goal; continue lipitor 40 mg Continue low cholesterol diet and exercise.  Check lipid panel.   Diabetes with diabetic chronic kidney disease Continue medication: start back on metformin will do 750 BID Also will start ozempic - sample given, 0.25 mg weekly x 4 weeks, then if tolerating will send in 0.5 mg weekly  Sent in glucometer and supplies to start checking fasting glucose daily  Continue diet and exercise.  Perform daily foot/skin check, notify office of any concerning changes.  Check A1C  Morbid Obesity with co morbidities Long discussion about weight loss, diet, and exercise Recommended diet heavy in fruits and veggies and low in animal meats, cheeses, and dairy products, appropriate calorie intake Discussed ideal weight for height Patient on phentermine/topamax with benefit and no SE, currently taking drug break; continue close follow up.  Will try adding ozempic,  Add exercise Will follow up in 3 months  Vitamin D Def At goal, continue supplementation Goal of 60-100 Defer checking vitamin D level  Gout He reports improved with lifestyle, not taking allopurinol  Diet discussed Check uric acid as needed  Continue diet and meds as discussed. Further disposition pending results of labs. Discussed med's effects and SE's.   Over 30 minutes of exam, counseling, chart review, and critical decision making was performed.   Future Appointments  Date Time Provider [place]  01/12/2021  8:45 AM Magda Bernheim, NP [facility] None  08/19/2021 11:00 AM [name],  MD [facility] None    ----------------------------------------------------------------------------------------------------------------------  HPI 60 y.o. male  presents for 3 month follow up on hypertension, cholesterol, diabetes with CKD, morbid obesity, gout and vitamin D deficiency.   he is prescribed phentermine for weight loss, taking a drug break  While on the medication they have lost 8 lbs since last visit. They deny palpitations, anxiety, trouble sleeping, elevated BP.   BMI is There is no height or weight on file to calculate BMI., he is working on diet right now, but knows he needs to do better, needs to move more, does eat a whole lot but does admit to a lot of meat  Wt Readings from Last 3 Encounters:  08/14/20 253 lb (114.8 kg)  08/12/20 253 lb 12.8 oz (115.1 kg)  06/30/20 258 lb 3.2 oz (117.1 kg)   He reports when he takes meds BP at home is good (can't recall numbers), admits hasn't taken BP medication, today their BP is   Has picked up pill dispenser to help with compliance/consistency   He does not workout. He denies chest pain, shortness of breath, dizziness.   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  He does not check sugars.  Does have a machine , plans to start.  Last A1C in the office was:  Lab Results  Component Value Date   HGBA1C 11.9 (H) 08/12/2020   He has CKD borderline stage 1/2 associated with T2DM monitored at this office:  Lab Results  Component Value Date   GFRNONAA 80 08/12/2020   Lab Results  Component Value Date   CHOL 196 08/12/2020   HDL 38 (L) 08/12/2020   LDLCALC 106 (H) 08/12/2020   TRIG 387 (H) 08/12/2020   CHOLHDL  5.2 (H) 08/12/2020   Patient is on Vitamin D supplement and at goal at last check:   Lab Results  Component Value Date   VD25OH 54 08/12/2020     Patient is prescribed allopurinol for gout but doesn't take, and does not report a recent flare.  Lab Results  Component Value Date   LABURIC 7.3 08/12/2020   He has a history of testosterone deficiency and is on zinc supplement.  Lab Results  Component Value Date   TESTOSTERONE 228 (L) 08/12/2020   Hx of LFT elevations, did have negative hepatitis panel in 2015, recent normal iron in 07/2019, never had [place]  Lab Results  Component Value Date   ALT 48 (H) 08/12/2020   AST 34 08/12/2020   ALKPHOS 52 10/14/2016   BILITOT 1.1 08/12/2020      Current Medications:  Current Outpatient Medications on File Prior to Visit  Medication Sig   rosuvastatin (CRESTOR) 10 MG tablet Take  1 tablet  Daily  for Cholesterol   allopurinol (ZYLOPRIM) 300 MG tablet Take 1 tablet Daily to prevent Gout   aspirin 81 MG tablet Take 81 mg by mouth daily. Reported on 09/23/2015   Blood Glucose Monitoring Suppl (FREESTYLE LITE) DEVI 1 Device by Does not apply route 3 (three) times daily.   Cholecalciferol (VITAMIN D PO) Take 10,000 Units by mouth daily.   glipiZIDE (GLUCOTROL) 5 MG tablet TAKE 1 TABLET 3 TIMES A DAY WITH MEALS FOR DIABETES   glucose blood (FREESTYLE LITE) test strip Check blood sugar 1 to 2 times daily. DX-E11.9   Lancets (FREESTYLE) lancets Use as instructed   meloxicam (MOBIC) 7.5 MG tablet TAKE 1 TABLET (7.5 MG TOTAL) BY MOUTH DAILY FOR TWO WEEKS, THEN AS NEEDED.   metFORMIN (GLUCOPHAGE-XR) 500 MG 24 hr tablet TAKE 2 TABLETS 2 TIMES A DAY WITH MEALS FOR DIABETES   olmesartan-hydrochlorothiazide (BENICAR HCT) 20-12.5 MG tablet Take  1 tablet  Daily  for BP   Omega-3 Fatty Acids (FISH OIL OMEGA-3 PO) Take 1 capsule by mouth daily.   tamsulosin (FLOMAX) 0.4 MG CAPS capsule Take  1 tablet  at Bedtime for Prostate   Zinc 50 MG TABS Take 1 tablet  Daily   No current facility-administered medications on file prior to visit.     Allergies:  Allergies  Allergen Reactions   Codeine Nausea And Vomiting   Nsaids Other (See Comments)    Bleeding ulcer 2013     Medical History:  Past Medical History:  Diagnosis Date   Allergy    seasonal   Arthritis    Blood transfusion 09/2011   2 units   Diabetes mellitus without complication ([place])    History of adenomatous polyp of colon 10/07/2019   Due 10/03/2022 per Dr. Henrene Pastor   Hyperlipidemia    Hypertension    Renal disorder    Stomach ulcer    Family history- Reviewed and unchanged Social history- Reviewed and unchanged   Review of Systems:  Review of Systems  Constitutional:  Negative for chills, fever, malaise/fatigue and weight loss.  HENT:  Negative  for congestion, hearing loss and tinnitus.   Eyes:  Negative for blurred vision and double vision.  Respiratory:  Negative for cough, shortness of breath and wheezing.   Cardiovascular:  Negative for chest pain, palpitations, orthopnea, claudication and leg swelling.  Gastrointestinal:  Negative for abdominal pain, blood in stool, constipation, diarrhea, heartburn, melena, nausea and vomiting.  Genitourinary: Negative.   Musculoskeletal:  Negative for falls, joint pain and myalgias.  Skin:  Negative for rash.  Neurological:  Negative for dizziness, tingling, tremors, sensory change, loss of consciousness, weakness and headaches.  Endo/Heme/Allergies:  Negative for polydipsia.  Psychiatric/Behavioral: Negative.  Negative for depression, memory loss and suicidal ideas.   All other systems reviewed and are negative.   Physical Exam: There were no vitals taken for this visit. Wt Readings from Last 3 Encounters:  08/14/20 253 lb (114.8 kg)  08/12/20 253 lb 12.8 oz (115.1 kg)  06/30/20 258 lb 3.2 oz (117.1 kg)   General Appearance: Well nourished, in no apparent distress. Eyes: PERRLA, EOMs, conjunctiva no swelling or  erythema Sinuses: No Frontal/maxillary tenderness ENT/Mouth: Ext aud canals clear, TMs without erythema, bulging. No erythema, swelling, or exudate on post pharynx.  Tonsils not swollen or erythematous. Hearing normal.  Neck: Supple, thyroid normal.  Respiratory: Respiratory effort normal, BS equal bilaterally without rales, rhonchi, wheezing or stridor.  Cardio: RRR with no MRGs. Brisk peripheral pulses without edema.  Abdomen: Soft, + BS.  Non tender, no guarding, rebound, hernias, masses. Lymphatics: Non tender without lymphadenopathy.  Musculoskeletal: Full ROM, 5/5 strength, Normal gait Skin: Warm, dry without rashes, lesions, ecchymosis.  Neuro: Cranial nerves intact. No cerebellar symptoms.  Psych: Awake and oriented X 3, normal affect, Insight and Judgment appropriate.    Magda Bernheim, NP 8:44 AM [facility]

## 2021-01-12 ENCOUNTER — Other Ambulatory Visit: Payer: Self-pay

## 2021-01-12 ENCOUNTER — Encounter: Payer: Self-pay | Admitting: Nurse Practitioner

## 2021-01-12 ENCOUNTER — Ambulatory Visit: Payer: Commercial Managed Care - PPO | Admitting: Nurse Practitioner

## 2021-01-12 VITALS — BP 138/82 | HR 90 | Temp 97.1°F | Resp 16 | Ht 71.5 in | Wt 258.6 lb

## 2021-01-12 DIAGNOSIS — E1169 Type 2 diabetes mellitus with other specified complication: Secondary | ICD-10-CM

## 2021-01-12 DIAGNOSIS — E559 Vitamin D deficiency, unspecified: Secondary | ICD-10-CM

## 2021-01-12 DIAGNOSIS — N182 Chronic kidney disease, stage 2 (mild): Secondary | ICD-10-CM

## 2021-01-12 DIAGNOSIS — Z1389 Encounter for screening for other disorder: Secondary | ICD-10-CM

## 2021-01-12 DIAGNOSIS — G4733 Obstructive sleep apnea (adult) (pediatric): Secondary | ICD-10-CM

## 2021-01-12 DIAGNOSIS — E785 Hyperlipidemia, unspecified: Secondary | ICD-10-CM

## 2021-01-12 DIAGNOSIS — M1000 Idiopathic gout, unspecified site: Secondary | ICD-10-CM

## 2021-01-12 DIAGNOSIS — I10 Essential (primary) hypertension: Secondary | ICD-10-CM

## 2021-01-12 DIAGNOSIS — E1122 Type 2 diabetes mellitus with diabetic chronic kidney disease: Secondary | ICD-10-CM | POA: Diagnosis not present

## 2021-01-12 DIAGNOSIS — N138 Other obstructive and reflux uropathy: Secondary | ICD-10-CM

## 2021-01-12 DIAGNOSIS — Z79899 Other long term (current) drug therapy: Secondary | ICD-10-CM

## 2021-01-12 DIAGNOSIS — Z9114 Patient's other noncompliance with medication regimen: Secondary | ICD-10-CM

## 2021-01-12 DIAGNOSIS — Z1329 Encounter for screening for other suspected endocrine disorder: Secondary | ICD-10-CM

## 2021-01-12 DIAGNOSIS — E349 Endocrine disorder, unspecified: Secondary | ICD-10-CM

## 2021-01-12 NOTE — Patient Instructions (Signed)
Diabetes Mellitus and Nutrition, Adult When you have diabetes, or diabetes mellitus, it is very important to have healthy eating habits because your blood sugar (glucose) levels are greatly affected by what you eat and drink. Eating healthy foods in the right amounts, at about the same times every day, can help you:  Control your blood glucose.  Lower your risk of heart disease.  Improve your blood pressure.  Reach or maintain a healthy weight. What can affect my meal plan? Every person with diabetes is different, and each person has different needs for a meal plan. Your health care provider may recommend that you work with a dietitian to make a meal plan that is best for you. Your meal plan may vary depending on factors such as:  The calories you need.  The medicines you take.  Your weight.  Your blood glucose, blood pressure, and cholesterol levels.  Your activity level.  Other health conditions you have, such as heart or kidney disease. How do carbohydrates affect me? Carbohydrates, also called carbs, affect your blood glucose level more than any other type of food. Eating carbs naturally raises the amount of glucose in your blood. Carb counting is a method for keeping track of how many carbs you eat. Counting carbs is important to keep your blood glucose at a healthy level, especially if you use insulin or take certain oral diabetes medicines. It is important to know how many carbs you can safely have in each meal. This is different for every person. Your dietitian can help you calculate how many carbs you should have at each meal and for each snack. How does alcohol affect me? Alcohol can cause a sudden decrease in blood glucose (hypoglycemia), especially if you use insulin or take certain oral diabetes medicines. Hypoglycemia can be a life-threatening condition. Symptoms of hypoglycemia, such as sleepiness, dizziness, and confusion, are similar to symptoms of having too much  alcohol.  Do not drink alcohol if: ? Your health care provider tells you not to drink. ? You are pregnant, may be pregnant, or are planning to become pregnant.  If you drink alcohol: ? Do not drink on an empty stomach. ? Limit how much you use to:  0-1 drink a day for women.  0-2 drinks a day for men. ? Be aware of how much alcohol is in your drink. In the [place] one drink equals one 12 oz bottle of beer (355 mL), one 5 oz glass of wine (148 mL), or one 1 oz glass of hard liquor (44 mL). ? Keep yourself hydrated with water, diet soda, or unsweetened iced tea.  Keep in mind that regular soda, juice, and other mixers may contain a lot of sugar and must be counted as carbs. What are tips for following this plan? Reading food labels  Start by checking the serving size on the "Nutrition Facts" label of packaged foods and drinks. The amount of calories, carbs, fats, and other nutrients listed on the label is based on one serving of the item. Many items contain more than one serving per package.  Check the total grams (g) of carbs in one serving. You can calculate the number of servings of carbs in one serving by dividing the total carbs by 15. For example, if a food has 30 g of total carbs per serving, it would be equal to 2 servings of carbs.  Check the number of grams (g) of saturated fats and trans fats in one serving. Choose foods that have   a low amount or none of these fats.  Check the number of milligrams (mg) of salt (sodium) in one serving. Most people should limit total sodium intake to less than 2,300 mg per day.  Always check the nutrition information of foods labeled as "low-fat" or "nonfat." These foods may be higher in added sugar or refined carbs and should be avoided.  Talk to your dietitian to identify your daily goals for nutrients listed on the label. Shopping  Avoid buying canned, pre-made, or processed foods. These foods tend to be high in fat, sodium, and added  sugar.  Shop around the outside edge of the grocery store. This is where you will most often find fresh fruits and vegetables, bulk grains, fresh meats, and fresh dairy. Cooking  Use low-heat cooking methods, such as baking, instead of high-heat cooking methods like deep frying.  Cook using healthy oils, such as olive, canola, or sunflower oil.  Avoid cooking with butter, cream, or high-fat meats. Meal planning  Eat meals and snacks regularly, preferably at the same times every day. Avoid going long periods of time without eating.  Eat foods that are high in fiber, such as fresh fruits, vegetables, beans, and whole grains. Talk with your dietitian about how many servings of carbs you can eat at each meal.  Eat 4-6 oz (112-168 g) of lean protein each day, such as lean meat, chicken, fish, eggs, or tofu. One ounce (oz) of lean protein is equal to: ? 1 oz (28 g) of meat, chicken, or fish. ? 1 egg. ?  cup (62 g) of tofu.  Eat some foods each day that contain healthy fats, such as avocado, nuts, seeds, and fish.   What foods should I eat? Fruits Berries. Apples. Oranges. Peaches. Apricots. Plums. Grapes. Mango. Papaya. Pomegranate. Kiwi. Cherries. Vegetables Lettuce. Spinach. Leafy greens, including kale, chard, collard greens, and mustard greens. Beets. Cauliflower. Cabbage. Broccoli. Carrots. Green beans. Tomatoes. Peppers. Onions. Cucumbers. Brussels sprouts. Grains Whole grains, such as whole-wheat or whole-grain bread, crackers, tortillas, cereal, and pasta. Unsweetened oatmeal. Quinoa. Brown or wild rice. Meats and other proteins Seafood. Poultry without skin. Lean cuts of poultry and beef. Tofu. Nuts. Seeds. Dairy Low-fat or fat-free dairy products such as milk, yogurt, and cheese. The items listed above may not be a complete list of foods and beverages you can eat. Contact a dietitian for more information. What foods should I avoid? Fruits Fruits canned with  syrup. Vegetables Canned vegetables. Frozen vegetables with butter or cream sauce. Grains Refined white flour and flour products such as bread, pasta, snack foods, and cereals. Avoid all processed foods. Meats and other proteins Fatty cuts of meat. Poultry with skin. Breaded or fried meats. Processed meat. Avoid saturated fats. Dairy Full-fat yogurt, cheese, or milk. Beverages Sweetened drinks, such as soda or iced tea. The items listed above may not be a complete list of foods and beverages you should avoid. Contact a dietitian for more information. Questions to ask a health care provider  Do I need to meet with a diabetes educator?  Do I need to meet with a dietitian?  What number can I call if I have questions?  When are the best times to check my blood glucose? Where to find more information:  American Diabetes Association: diabetes.org  Academy of Nutrition and Dietetics: www.eatright.org  National Institute of Diabetes and Digestive and Kidney Diseases: www.niddk.nih.gov  Association of Diabetes Care and Education Specialists: www.diabeteseducator.org Summary  It is important to have healthy eating   habits because your blood sugar (glucose) levels are greatly affected by what you eat and drink.  A healthy meal plan will help you control your blood glucose and maintain a healthy lifestyle.  Your health care provider may recommend that you work with a dietitian to make a meal plan that is best for you.  Keep in mind that carbohydrates (carbs) and alcohol have immediate effects on your blood glucose levels. It is important to count carbs and to use alcohol carefully. This information is not intended to replace advice given to you by your health care provider. Make sure you discuss any questions you have with your health care provider. Document Revised: 04/23/2019 Document Reviewed: 04/23/2019 Elsevier Patient Education  2021 Elsevier Inc.  

## 2021-01-12 NOTE — Progress Notes (Signed)
FOLLOW UP  Assessment and Plan:   Hypertension Well controlled with current medications  Monitor blood pressure at home; patient to call if consistently greater than 130/80 Continue DASH diet.   Reminder to go to the ER if any CP, SOB, nausea, dizziness, severe HA, changes vision/speech, left arm numbness and tingling and jaw pain.  Cholesterol Currently not at goal;  Continue low cholesterol diet and exercise.  Check lipid panel.   Diabetes without complications Continue medication: Continue diet and exercise.  Perform daily foot/skin check, notify office of any concerning changes.  Check A1C  Obesity with co morbidities Long discussion about weight loss, diet, and exercise Recommended diet heavy in fruits and veggies and low in animal meats, cheeses, and dairy products, appropriate calorie intake Patient will work on stopping soda, limiting carbs Will follow up in 3 months  Idiopathic Gout Unspecified chronicity Conitue Allopurinol , limit meats and alcohol in diet Check uric acid level  Vitamin D Def At goal at last visit; continue supplementation to maintain goal of 60-100 Defer Vit D level  Continue diet and meds as discussed. Further disposition pending results of labs. Discussed med's effects and SE's.   Over 30 minutes of exam, counseling, chart review, and critical decision making was performed.   Future Appointments  Date Time Provider [place]  08/19/2021 11:00 AM [name], MD [facility] None    ----------------------------------------------------------------------------------------------------------------------  HPI 60 y.o. male  presents for 3 month follow up on hypertension, cholesterol, diabetes, weight and vitamin D deficiency.   BMI is Body mass index is 35.56 kg/m., he has been working on diet and exercise. Wt Readings from Last 3 Encounters:  01/12/21 258 lb 9.6 oz (117.3 kg)  08/14/20 253 lb (114.8 kg)  08/12/20 253 lb 12.8 oz  (115.1 kg)    His blood pressure has been controlled at home, today their BP is BP: 138/82  He does not workout. He denies chest pain, shortness of breath, dizziness.   He is on cholesterol medication Rosuvastatin and denies myalgias. His cholesterol is not at goal. The cholesterol last visit was:   Lab Results  Component Value Date   CHOL 196 08/12/2020   HDL 38 (L) 08/12/2020   LDLCALC 106 (H) 08/12/2020   TRIG 387 (H) 08/12/2020   CHOLHDL 5.2 (H) 08/12/2020    He has been working on diet and exercise for prediabetes, and denies polyuria. Last A1C in the office was:  Lab Results  Component Value Date   HGBA1C 11.9 (H) 08/12/2020   Patient is on Vitamin D supplement.   Lab Results  Component Value Date   VD25OH 54 08/12/2020        Current Medications:  Current Outpatient Medications on File Prior to Visit  Medication Sig   allopurinol (ZYLOPRIM) 300 MG tablet Take 1 tablet Daily to prevent Gout   aspirin 81 MG tablet Take 81 mg by mouth daily. Reported on 09/23/2015   Blood Glucose Monitoring Suppl (FREESTYLE LITE) DEVI 1 Device by Does not apply route 3 (three) times daily.   Cholecalciferol (VITAMIN D PO) Take 10,000 Units by mouth daily.   glipiZIDE (GLUCOTROL) 5 MG tablet TAKE 1 TABLET 3 TIMES A DAY WITH MEALS FOR DIABETES   glucose blood (FREESTYLE LITE) test strip Check blood sugar 1 to 2 times daily. DX-E11.9   Lancets (FREESTYLE) lancets Use as instructed   meloxicam (MOBIC) 7.5 MG tablet TAKE 1 TABLET (7.5 MG TOTAL) BY MOUTH DAILY FOR TWO WEEKS, THEN AS NEEDED.  metFORMIN (GLUCOPHAGE-XR) 500 MG 24 hr tablet TAKE 2 TABLETS 2 TIMES A DAY WITH MEALS FOR DIABETES   olmesartan-hydrochlorothiazide (BENICAR HCT) 20-12.5 MG tablet Take  1 tablet  Daily  for BP   Omega-3 Fatty Acids (FISH OIL OMEGA-3 PO) Take 1 capsule by mouth daily.   rosuvastatin (CRESTOR) 10 MG tablet Take  1 tablet  Daily  for Cholesterol   tamsulosin (FLOMAX) 0.4 MG CAPS capsule Take  1 tablet  at  Bedtime for Prostate   Zinc 50 MG TABS Take 1 tablet Daily   No current facility-administered medications on file prior to visit.     Allergies:  Allergies  Allergen Reactions   Codeine Nausea And Vomiting   Nsaids Other (See Comments)    Bleeding ulcer 2013     Medical History:  Past Medical History:  Diagnosis Date   Allergy    seasonal   Arthritis    Blood transfusion 09/2011   2 units   Diabetes mellitus without complication ([place])    History of adenomatous polyp of colon 10/07/2019   Due 10/03/2022 per Dr. Henrene Pastor   Hyperlipidemia    Hypertension    Renal disorder    Stomach ulcer    Family history- Reviewed and unchanged Social history- Reviewed and unchanged   Review of Systems:  Review of Systems  Constitutional:  Negative for chills, fever and weight loss.  HENT:  Negative for congestion and hearing loss.   Eyes:  Negative for blurred vision and double vision.  Respiratory:  Negative for cough and shortness of breath.   Cardiovascular:  Negative for chest pain, palpitations, orthopnea and leg swelling.  Gastrointestinal:  Negative for abdominal pain, constipation, diarrhea, heartburn, nausea and vomiting.  Musculoskeletal:  Negative for falls, joint pain and myalgias.  Skin:  Negative for rash.  Neurological:  Negative for dizziness, tingling, tremors, loss of consciousness and headaches.  Psychiatric/Behavioral:  Negative for depression, memory loss and suicidal ideas.      Physical Exam: BP 138/82   Pulse 90   Temp (!) 97.1 F (36.2 C)   Resp 16   Ht 5' 11.5" (1.816 m)   Wt 258 lb 9.6 oz (117.3 kg)   SpO2 98%   BMI 35.56 kg/m  Wt Readings from Last 3 Encounters:  01/12/21 258 lb 9.6 oz (117.3 kg)  08/14/20 253 lb (114.8 kg)  08/12/20 253 lb 12.8 oz (115.1 kg)   General Appearance: Well nourished, in no apparent distress. Eyes: PERRLA, EOMs, conjunctiva no swelling or erythema Sinuses: No Frontal/maxillary tenderness ENT/Mouth: Ext aud canals  clear, TMs without erythema, bulging. No erythema, swelling, or exudate on post pharynx.  Tonsils not swollen or erythematous. Hearing normal.  Neck: Supple, thyroid normal.  Respiratory: Respiratory effort normal, BS equal bilaterally without rales, rhonchi, wheezing or stridor.  Cardio: RRR with no MRGs. Brisk peripheral pulses without edema.  Abdomen: Soft, + BS.  Non tender, no guarding, rebound, hernias, masses. Lymphatics: Non tender without lymphadenopathy.  Musculoskeletal: Full ROM, 5/5 strength, Normal gait Skin: Warm, dry without rashes, lesions, ecchymosis.  Neuro: Cranial nerves intact. No cerebellar symptoms.  Psych: Awake and oriented X 3, normal affect, Insight and Judgment appropriate.    Magda Bernheim, NP 9:15 AM [facility]

## 2021-01-13 ENCOUNTER — Ambulatory Visit: Payer: Commercial Managed Care - PPO | Admitting: Nurse Practitioner

## 2021-01-13 LAB — CBC WITH DIFFERENTIAL/PLATELET
Absolute Monocytes: 518 cells/uL (ref 200–950)
Basophils Absolute: 49 cells/uL (ref 0–200)
Basophils Relative: 0.7 %
Eosinophils Absolute: 273 cells/uL (ref 15–500)
Eosinophils Relative: 3.9 %
HCT: 49.1 % (ref 38.5–50.0)
Hemoglobin: 17 g/dL (ref 13.2–17.1)
Lymphs Abs: 2450 cells/uL (ref 850–3900)
MCH: 31.7 pg (ref 27.0–33.0)
MCHC: 34.6 g/dL (ref 32.0–36.0)
MCV: 91.6 fL (ref 80.0–100.0)
MPV: 10.3 fL (ref 7.5–12.5)
Monocytes Relative: 7.4 %
Neutro Abs: 3710 cells/uL (ref 1500–7800)
Neutrophils Relative %: 53 %
Platelets: 203 10*3/uL (ref 140–400)
RBC: 5.36 10*6/uL (ref 4.20–5.80)
RDW: 12 % (ref 11.0–15.0)
Total Lymphocyte: 35 %
WBC: 7 10*3/uL (ref 3.8–10.8)

## 2021-01-13 LAB — LIPID PANEL
Cholesterol: 135 mg/dL (ref <200)
HDL: 42 mg/dL (ref >=40)
LDL Cholesterol (Calc): 60 mg/dL (calc)
Non-HDL Cholesterol (Calc): 93 mg/dL (calc) (ref <130)
Total CHOL/HDL Ratio: 3.2 (calc) (ref <5.0)
Triglycerides: 281 mg/dL — ABNORMAL HIGH (ref <150)

## 2021-01-13 LAB — COMPLETE METABOLIC PANEL WITH GFR
AG Ratio: 1.6 (calc) (ref 1.0–2.5)
ALT: 53 U/L — ABNORMAL HIGH (ref 9–46)
AST: 41 U/L — ABNORMAL HIGH (ref 10–35)
Albumin: 4.6 g/dL (ref 3.6–5.1)
Alkaline phosphatase (APISO): 53 U/L (ref 35–144)
BUN: 15 mg/dL (ref 7–25)
CO2: 30 mmol/L (ref 20–32)
Calcium: 9.9 mg/dL (ref 8.6–10.3)
Chloride: 98 mmol/L (ref 98–110)
Creat: 1.21 mg/dL (ref 0.70–1.30)
Globulin: 2.9 g/dL (calc) (ref 1.9–3.7)
Glucose, Bld: 340 mg/dL — ABNORMAL HIGH (ref 65–99)
Potassium: 4.4 mmol/L (ref 3.5–5.3)
Sodium: 136 mmol/L (ref 135–146)
Total Bilirubin: 1 mg/dL (ref 0.2–1.2)
Total Protein: 7.5 g/dL (ref 6.1–8.1)
eGFR: 69 mL/min/{1.73_m2} (ref >=60)

## 2021-01-13 LAB — MICROALBUMIN / CREATININE URINE RATIO
Creatinine, Urine: 47 mg/dL (ref 20–320)
Microalb Creat Ratio: 6 mcg/mg creat (ref <30)
Microalb, Ur: 0.3 mg/dL

## 2021-01-13 LAB — HEMOGLOBIN A1C
Hgb A1c MFr Bld: 12 % of total Hgb — ABNORMAL HIGH (ref <5.7)
Mean Plasma Glucose: 298 mg/dL
eAG (mmol/L): 16.5 mmol/L

## 2021-01-13 LAB — URIC ACID: Uric Acid, Serum: 7.3 mg/dL (ref 4.0–8.0)

## 2021-01-21 ENCOUNTER — Encounter: Payer: Self-pay | Admitting: Nurse Practitioner

## 2021-01-21 ENCOUNTER — Other Ambulatory Visit: Payer: Self-pay

## 2021-01-21 ENCOUNTER — Ambulatory Visit (INDEPENDENT_AMBULATORY_CARE_PROVIDER_SITE_OTHER): Payer: Commercial Managed Care - PPO | Admitting: Nurse Practitioner

## 2021-01-21 VITALS — BP 130/82 | HR 84 | Temp 97.1°F | Resp 16 | Ht 72.0 in | Wt 256.4 lb

## 2021-01-21 DIAGNOSIS — N182 Chronic kidney disease, stage 2 (mild): Secondary | ICD-10-CM | POA: Diagnosis not present

## 2021-01-21 DIAGNOSIS — E1122 Type 2 diabetes mellitus with diabetic chronic kidney disease: Secondary | ICD-10-CM | POA: Diagnosis not present

## 2021-01-21 MED ORDER — OZEMPIC (0.25 OR 0.5 MG/DOSE) 2 MG/1.5ML ~~LOC~~ SOPN: 0.5000 mg | PEN_INJECTOR | SUBCUTANEOUS | 2 refills | Status: DC

## 2021-01-21 MED ORDER — MOUNJARO 5 MG/0.5ML ~~LOC~~ SOAJ: 5.0000 mg | SUBCUTANEOUS | 2 refills | Status: DC

## 2021-01-21 NOTE — Progress Notes (Signed)
Pt was initially going to start [place] but is interested in [place]. Script sent to pharmacy and pt instructed on use by nurse today. Given sample on 2.5 mg and sent in script for 5 mg once a week.  Nurse instructed patient on use of medication. Will return in 3 months for A1C recheck.

## 2021-04-15 NOTE — Progress Notes (Deleted)
FOLLOW UP  Assessment and Plan:    Continue diet and meds as discussed. Further disposition pending results of labs. Discussed med's effects and SE's.   Over 30 minutes of exam, counseling, chart review, and critical decision making was performed.   Future Appointments  Date Time Provider [place]  04/20/2021  9:30 AM Magda Bernheim, NP [facility] None  08/19/2021 11:00 AM [name], MD [facility] None    ----------------------------------------------------------------------------------------------------------------------  HPI 60 y.o. male  presents for 3 month follow up on hypertension, cholesterol, diabetes, weight and vitamin D deficiency.   BMI is There is no height or weight on file to calculate BMI., he has been working on diet and exercise. Wt Readings from Last 3 Encounters:  01/21/21 256 lb 6.4 oz (116.3 kg)  01/12/21 258 lb 9.6 oz (117.3 kg)  08/14/20 253 lb (114.8 kg)    His blood pressure has been controlled at home, today their BP is    He does not workout. He denies chest pain, shortness of breath, dizziness.   He is on cholesterol medication Rosuvastatin and denies myalgias. His cholesterol is not at goal. The cholesterol last visit was:   Lab Results  Component Value Date   CHOL 135 01/12/2021   HDL 42 01/12/2021   LDLCALC 60 01/12/2021   TRIG 281 (H) 01/12/2021   CHOLHDL 3.2 01/12/2021    He has been working on diet and exercise for prediabetes, and denies polyuria. Last A1C in the office was:  Lab Results  Component Value Date   HGBA1C 12.0 (H) 01/12/2021   Patient is on Vitamin D supplement.   Lab Results  Component Value Date   VD25OH 54 08/12/2020        Current Medications:  Current Outpatient Medications on File Prior to Visit  Medication Sig   allopurinol (ZYLOPRIM) 300 MG tablet Take 1 tablet Daily to prevent Gout   aspirin 81 MG tablet Take 81 mg by mouth daily. Reported on 09/23/2015   Blood Glucose Monitoring Suppl  (FREESTYLE LITE) DEVI 1 Device by Does not apply route 3 (three) times daily.   Cholecalciferol (VITAMIN D PO) Take 10,000 Units by mouth daily.   glipiZIDE (GLUCOTROL) 5 MG tablet TAKE 1 TABLET 3 TIMES A DAY WITH MEALS FOR DIABETES   glucose blood (FREESTYLE LITE) test strip Check blood sugar 1 to 2 times daily. DX-E11.9   Lancets (FREESTYLE) lancets Use as instructed   meloxicam (MOBIC) 7.5 MG tablet TAKE 1 TABLET (7.5 MG TOTAL) BY MOUTH DAILY FOR TWO WEEKS, THEN AS NEEDED.   metFORMIN (GLUCOPHAGE-XR) 500 MG 24 hr tablet TAKE 2 TABLETS 2 TIMES A DAY WITH MEALS FOR DIABETES   olmesartan-hydrochlorothiazide (BENICAR HCT) 20-12.5 MG tablet Take  1 tablet  Daily  for BP   Omega-3 Fatty Acids (FISH OIL OMEGA-3 PO) Take 1 capsule by mouth daily.   rosuvastatin (CRESTOR) 10 MG tablet Take  1 tablet  Daily  for Cholesterol   tirzepatide (MOUNJARO) 5 MG/0.5ML Pen Inject 5 mg into the skin once a week.   Zinc 50 MG TABS Take 1 tablet Daily   No current facility-administered medications on file prior to visit.     Allergies:  Allergies  Allergen Reactions   Codeine Nausea And Vomiting   Nsaids Other (See Comments)    Bleeding ulcer 2013     Medical History:  Past Medical History:  Diagnosis Date   Allergy    seasonal   Arthritis    Blood transfusion 09/2011  2 units   Diabetes mellitus without complication ([place])    History of adenomatous polyp of colon 10/07/2019   Due 10/03/2022 per Dr. Henrene Pastor   Hyperlipidemia    Hypertension    Renal disorder    Stomach ulcer    Family history- Reviewed and unchanged Social history- Reviewed and unchanged   Review of Systems:  Review of Systems  Constitutional:  Negative for chills, fever and weight loss.  HENT:  Negative for congestion and hearing loss.   Eyes:  Negative for blurred vision and double vision.  Respiratory:  Negative for cough and shortness of breath.   Cardiovascular:  Negative for chest pain, palpitations, orthopnea and leg  swelling.  Gastrointestinal:  Negative for abdominal pain, constipation, diarrhea, heartburn, nausea and vomiting.  Musculoskeletal:  Negative for falls, joint pain and myalgias.  Skin:  Negative for rash.  Neurological:  Negative for dizziness, tingling, tremors, loss of consciousness and headaches.  Psychiatric/Behavioral:  Negative for depression, memory loss and suicidal ideas.      Physical Exam: There were no vitals taken for this visit. Wt Readings from Last 3 Encounters:  01/21/21 256 lb 6.4 oz (116.3 kg)  01/12/21 258 lb 9.6 oz (117.3 kg)  08/14/20 253 lb (114.8 kg)   General Appearance: Well nourished, in no apparent distress. Eyes: PERRLA, EOMs, conjunctiva no swelling or erythema Sinuses: No Frontal/maxillary tenderness ENT/Mouth: Ext aud canals clear, TMs without erythema, bulging. No erythema, swelling, or exudate on post pharynx.  Tonsils not swollen or erythematous. Hearing normal.  Neck: Supple, thyroid normal.  Respiratory: Respiratory effort normal, BS equal bilaterally without rales, rhonchi, wheezing or stridor.  Cardio: RRR with no MRGs. Brisk peripheral pulses without edema.  Abdomen: Soft, + BS.  Non tender, no guarding, rebound, hernias, masses. Lymphatics: Non tender without lymphadenopathy.  Musculoskeletal: Full ROM, 5/5 strength, Normal gait Skin: Warm, dry without rashes, lesions, ecchymosis.  Neuro: Cranial nerves intact. No cerebellar symptoms.  Psych: Awake and oriented X 3, normal affect, Insight and Judgment appropriate.    Magda Bernheim, NP 1:48 PM [facility]

## 2021-04-20 ENCOUNTER — Ambulatory Visit: Payer: Commercial Managed Care - PPO | Admitting: Nurse Practitioner

## 2021-05-03 ENCOUNTER — Other Ambulatory Visit: Payer: Self-pay

## 2021-05-03 ENCOUNTER — Ambulatory Visit (INDEPENDENT_AMBULATORY_CARE_PROVIDER_SITE_OTHER): Payer: Commercial Managed Care - PPO | Admitting: Nurse Practitioner

## 2021-05-03 ENCOUNTER — Encounter: Payer: Self-pay | Admitting: Internal Medicine

## 2021-05-03 ENCOUNTER — Encounter: Payer: Self-pay | Admitting: Nurse Practitioner

## 2021-05-03 VITALS — BP 160/88 | HR 92 | Temp 97.5°F | Wt 254.4 lb

## 2021-05-03 DIAGNOSIS — J069 Acute upper respiratory infection, unspecified: Secondary | ICD-10-CM | POA: Diagnosis not present

## 2021-05-03 DIAGNOSIS — Z1152 Encounter for screening for COVID-19: Secondary | ICD-10-CM

## 2021-05-03 DIAGNOSIS — R6889 Other general symptoms and signs: Secondary | ICD-10-CM

## 2021-05-03 LAB — POCT INFLUENZA A/B
Influenza A, POC: NEGATIVE
Influenza B, POC: NEGATIVE

## 2021-05-03 LAB — POC COVID19 BINAXNOW: SARS Coronavirus 2 Ag: NEGATIVE

## 2021-05-03 MED ORDER — AZITHROMYCIN 250 MG PO TABS: ORAL_TABLET | ORAL | 1 refills | Status: DC

## 2021-05-03 MED ORDER — BENZONATATE 100 MG PO CAPS: 200.0000 mg | ORAL_CAPSULE | Freq: Three times a day (TID) | ORAL | 0 refills | Status: DC | PRN

## 2021-05-03 MED ORDER — DEXAMETHASONE 2 MG PO TABS: ORAL_TABLET | ORAL | 0 refills | Status: DC

## 2021-05-03 NOTE — Progress Notes (Signed)
Assessment and Plan:  Paul Warner was seen today for acute visit.  Diagnoses and all orders for this visit:  Flu-like symptoms -     POCT Influenza A/B  Encounter for screening for COVID-19 -     POC COVID-19  URI, acute -     dexamethasone (DECADRON) 2 MG tablet; 3 tab x 3 days, 2 tabs a day x 3 day then 1 tab a day x 5 days -     azithromycin (ZITHROMAX) 250 MG tablet; Take 2 tablets (500 mg) on  Day 1,  followed by 1 tablet (250 mg) once daily on Days 2 through 5. -     benzonatate (TESSALON PERLES) 100 MG capsule; Take 2 capsules (200 mg total) by mouth 3 (three) times daily as needed for cough (Max: 600mg  per day).   Continue Mucinex, push fluids, Tylenol as needed for aches and fever.  If develops worsening of cough or shortness of breath call office, will order CXR    Further disposition pending results of labs. Discussed med's effects and SE's.   Over 30 minutes of exam, counseling, chart review, and critical decision making was performed.   Future Appointments  Date Time Provider [place]  05/05/2021 11:30 AM [name], NP [facility] None  08/19/2021 11:00 AM [name], MD [facility] None    ------------------------------------------------------------------------------------------------------------------   HPI BP (!) 160/88   Pulse 92   Temp (!) 97.5 F (36.4 C)   Wt 254 lb 6.4 oz (115.4 kg)   SpO2 99%   BMI 34.50 kg/m  60 y.o.male presents for fatigue, muscle aches and cough  Starting 3 days ago he developed cough that is productive of clear mucus. He has run a fever, having joint and muscle pain, fatigue.  He is getting a little yellow nasal discharge. Denies nausea, vomiting of diarrhea. He is trying Mucinex DM and Nyquil with minimal relief   Past Medical History:  Diagnosis Date   Allergy    seasonal   Arthritis    Blood transfusion 09/2011   2 units   Diabetes mellitus without complication ([place])    History of adenomatous polyp of  colon 10/07/2019   Due 10/03/2022 per Dr. Henrene Pastor   Hyperlipidemia    Hypertension    Renal disorder    Stomach ulcer      Allergies  Allergen Reactions   Codeine Nausea And Vomiting   Nsaids Other (See Comments)    Bleeding ulcer 2013    Current Outpatient Medications on File Prior to Visit  Medication Sig   allopurinol (ZYLOPRIM) 300 MG tablet Take 1 tablet Daily to prevent Gout   aspirin 81 MG tablet Take 81 mg by mouth daily. Reported on 09/23/2015   Blood Glucose Monitoring Suppl (FREESTYLE LITE) DEVI 1 Device by Does not apply route 3 (three) times daily.   Cholecalciferol (VITAMIN D PO) Take 10,000 Units by mouth daily.   glipiZIDE (GLUCOTROL) 5 MG tablet TAKE 1 TABLET 3 TIMES A DAY WITH MEALS FOR DIABETES   meloxicam (MOBIC) 7.5 MG tablet TAKE 1 TABLET (7.5 MG TOTAL) BY MOUTH DAILY FOR TWO WEEKS, THEN AS NEEDED.   metFORMIN (GLUCOPHAGE-XR) 500 MG 24 hr tablet TAKE 2 TABLETS 2 TIMES A DAY WITH MEALS FOR DIABETES   olmesartan-hydrochlorothiazide (BENICAR HCT) 20-12.5 MG tablet Take  1 tablet  Daily  for BP   Omega-3 Fatty Acids (FISH OIL OMEGA-3 PO) Take 1 capsule by mouth daily.   rosuvastatin (CRESTOR) 10 MG tablet Take  1 tablet  Daily  for Cholesterol   tirzepatide (MOUNJARO) 5 MG/0.5ML Pen Inject 5 mg into the skin once a week.   Zinc 50 MG TABS Take 1 tablet Daily   glucose blood (FREESTYLE LITE) test strip Check blood sugar 1 to 2 times daily. DX-E11.9   Lancets (FREESTYLE) lancets Use as instructed   No current facility-administered medications on file prior to visit.    ROS: all negative except above.   Physical Exam:  BP (!) 160/88   Pulse 92   Temp (!) 97.5 F (36.4 C)   Wt 254 lb 6.4 oz (115.4 kg)   SpO2 99%   BMI 34.50 kg/m   General Appearance: Well nourished, in no apparent distress. Eyes: PERRLA, EOMs, conjunctiva no swelling or erythema Sinuses: No Frontal/maxillary tenderness ENT/Mouth: Ext aud canals clear, Right TM, retracted with erythema.   Swelling and erythema noted of post pharynx.  Tonsils not swollen or erythematous. Hearing normal.  Neck: Supple, thyroid normal.  Respiratory: Respiratory effort normal, BS equal bilaterally without rales, rhonchi, wheezing or stridor.  Cardio: RRR with no MRGs. Brisk peripheral pulses without edema.  Abdomen: Soft, + BS.  Non tender, no guarding, rebound, hernias, masses. Lymphatics: Non tender without lymphadenopathy.  Musculoskeletal: Full ROM, 5/5 strength, normal gait.  Skin: Warm, dry without rashes, lesions, ecchymosis.  Neuro: Cranial nerves intact. Normal muscle tone, no cerebellar symptoms. Sensation intact.  Psych: Awake and oriented X 3, normal affect, Insight and Judgment appropriate.     Magda Bernheim, NP 10:45 AM [facility]

## 2021-05-03 NOTE — Patient Instructions (Signed)

## 2021-05-04 NOTE — Progress Notes (Signed)
FOLLOW UP  Assessment and Plan:   Hypertension Elevated; reports improved med compliance and was controlled at home Historically poorly controlled, currently on sudafed- stop BP safe alternatives suggested Monitor blood pressure at home; patient to call if consistently greater than 140/80 and will increase benicar HCT dose Continue DASH diet.   Reminder to go to the ER if any CP, SOB, nausea, dizziness, severe HA, changes vision/speech, left arm numbness and tingling and jaw pain. Follow up recheck 2 weeks with home BP log  Cholesterol Currently at goal; continue lipitor 40 mg Continue low cholesterol diet and exercise.  Check lipid panel.   Diabetes with diabetic chronic kidney disease Increase metformin from 1000 mg daily to 2000 mg daily Consider d/c glipizide if A1C <7% Increase mounjaro to 7.5 mg weekly with next refill Continue diet and exercise.  Perform daily foot/skin check, notify office of any concerning changes.  Check A1C  Obesity Slow steady progress on GLP-1ra Long discussion about weight loss, diet, and exercise Recommended diet heavy in fruits and veggies and low in animal meats, cheeses, and dairy products, appropriate calorie intake Discussed ideal weight for height Doing well on mounjaro; increase to 7.5 mg weekly at next refill Add exercise Will follow up in 3 months  Vitamin D Def At goal, continue supplementation Goal of 60-100 Defer checking vitamin D level  Gout He reports improved with lifestyle, not taking allopurinol  Diet discussed Check uric acid as needed  Continue diet and meds as discussed. Further disposition pending results of labs. Discussed med's effects and SE's.   Over 30 minutes of exam, counseling, chart review, and critical decision making was performed.   Future Appointments  Date Time Provider [place]  08/19/2021 11:00 AM [name], MD [facility] None     ----------------------------------------------------------------------------------------------------------------------  HPI 60 y.o. male  presents for 3 month follow up on hypertension, cholesterol, diabetes with CKD, morbid obesity, gout and vitamin D deficiency.   Recovering from URI after conference, was started on steroid taper, zpak, tessalon and improving.   BMI is Body mass index is 34.31 kg/m., he is working on diet right now, active on his job. Has cut out soda, doing mostly unsweet tea. Has cut portions, will not finish his plate. Avoiding fries.  Wt Readings from Last 3 Encounters:  05/05/21 253 lb (114.8 kg)  05/03/21 254 lb 6.4 oz (115.4 kg)  01/21/21 256 lb 6.4 oz (116.3 kg)   He reports when he takes meds BP at home is good (can't recall numbers), admits he took sudafed this AM, today their BP is BP: (!) 158/98, historically poorly controlled due to med non-compliance He reports has picked up pill dispenser to help with compliance/consistency and doing much better with this  He does not workout. He denies chest pain, shortness of breath, dizziness.   He has been working on diet and exercise for T2 diabetes CKD stage 2 he is on benicar HCT Hyperlipidemia rosuvastatin 10 mg, at LDL goal  Metformin 500 mg taking 2 tabs in AM only right now,    glipizide 5 mg - taking 1 tab daily with breakfast,  mounjaro 0.5 mg weekly and tolerating well  denies foot ulcerations, hyperglycemia, increased appetite, nausea, paresthesia of the feet, polydipsia, polyuria, visual disturbances, vomiting and weight loss.  He has been checking fasting intermittently, reports last he can recall was running around 90 Last A1C in the office was:  Lab Results  Component Value Date   HGBA1C 12.0 (H) 01/12/2021  He has CKD stage 2 associated with T2DM monitored at this office, on olmesartan:  Lab Results  Component Value Date   GFRNONAA 80 08/12/2020   Lab Results  Component Value Date    CHOL 135 01/12/2021   HDL 42 01/12/2021   LDLCALC 60 01/12/2021   TRIG 281 (H) 01/12/2021   CHOLHDL 3.2 01/12/2021   Patient is on Vitamin D supplement and at goal at last check:   Lab Results  Component Value Date   VD25OH 54 08/12/2020     Patient is prescribed allopurinol for gout but doesn't take, and does not report a recent flare. No flares since cutting out drinks.  Lab Results  Component Value Date   LABURIC 7.3 01/12/2021   He has a history of testosterone deficiency and is on zinc supplement, has declined supplement. No debilitating sx.  Lab Results  Component Value Date   TESTOSTERONE 228 (L) 08/12/2020   Hx of LFT elevations, did have negative hepatitis panel in 2015, recent normal iron in 07/2019, fatty liver per abd [place] 11/2019.  Lab Results  Component Value Date   ALT 53 (H) 01/12/2021   AST 41 (H) 01/12/2021   ALKPHOS 52 10/14/2016   BILITOT 1.0 01/12/2021      Current Medications:  Current Outpatient Medications on File Prior to Visit  Medication Sig   allopurinol (ZYLOPRIM) 300 MG tablet Take 1 tablet Daily to prevent Gout   aspirin 81 MG tablet Take 81 mg by mouth daily. Reported on 09/23/2015   azithromycin (ZITHROMAX) 250 MG tablet Take 2 tablets (500 mg) on  Day 1,  followed by 1 tablet (250 mg) once daily on Days 2 through 5.   benzonatate (TESSALON PERLES) 100 MG capsule Take 2 capsules (200 mg total) by mouth 3 (three) times daily as needed for cough (Max: 600mg  per day).   Blood Glucose Monitoring Suppl (FREESTYLE LITE) DEVI 1 Device by Does not apply route 3 (three) times daily.   Cholecalciferol (VITAMIN D PO) Take 10,000 Units by mouth daily.   dexamethasone (DECADRON) 2 MG tablet 3 tab x 3 days, 2 tabs a day x 3 day then 1 tab a day x 5 days   glipiZIDE (GLUCOTROL) 5 MG tablet TAKE 1 TABLET 3 TIMES A DAY WITH MEALS FOR DIABETES   glucose blood (FREESTYLE LITE) test strip Check blood sugar 1 to 2 times daily. DX-E11.9   Lancets (FREESTYLE) lancets  Use as instructed   meloxicam (MOBIC) 7.5 MG tablet TAKE 1 TABLET (7.5 MG TOTAL) BY MOUTH DAILY FOR TWO WEEKS, THEN AS NEEDED.   metFORMIN (GLUCOPHAGE-XR) 500 MG 24 hr tablet TAKE 2 TABLETS 2 TIMES A DAY WITH MEALS FOR DIABETES   olmesartan-hydrochlorothiazide (BENICAR HCT) 20-12.5 MG tablet Take  1 tablet  Daily  for BP   Omega-3 Fatty Acids (FISH OIL OMEGA-3 PO) Take 1 capsule by mouth daily.   rosuvastatin (CRESTOR) 10 MG tablet Take  1 tablet  Daily  for Cholesterol   tirzepatide (MOUNJARO) 5 MG/0.5ML Pen Inject 5 mg into the skin once a week.   Zinc 50 MG TABS Take 1 tablet Daily   No current facility-administered medications on file prior to visit.     Allergies:  Allergies  Allergen Reactions   Codeine Nausea And Vomiting   Nsaids Other (See Comments)    Bleeding ulcer 2013     Medical History:  Past Medical History:  Diagnosis Date   Allergy    seasonal   Arthritis  Blood transfusion 09/2011   2 units   Diabetes mellitus without complication ([facility])    History of adenomatous polyp of colon 10/07/2019   Due 10/03/2022 per Dr. Henrene Pastor   Hyperlipidemia    Hypertension    Renal disorder    Stomach ulcer    Family history- Reviewed and unchanged Social history- Reviewed and unchanged   Review of Systems:  Review of Systems  Constitutional:  Negative for malaise/fatigue and weight loss.  HENT:  Positive for congestion. Negative for hearing loss and tinnitus.   Eyes:  Negative for blurred vision and double vision.  Respiratory:  Positive for cough. Negative for sputum production, shortness of breath and wheezing.   Cardiovascular:  Negative for chest pain, palpitations, orthopnea, claudication and leg swelling.  Gastrointestinal:  Negative for abdominal pain, blood in stool, constipation, diarrhea, heartburn, melena, nausea and vomiting.  Genitourinary: Negative.   Musculoskeletal:  Negative for joint pain and myalgias.  Skin:  Negative for rash.  Neurological:   Negative for dizziness, tingling, sensory change, weakness and headaches.  Endo/Heme/Allergies:  Negative for environmental allergies and polydipsia.  Psychiatric/Behavioral: Negative.    All other systems reviewed and are negative.   Physical Exam: BP (!) 158/98   Pulse 83   Temp 97.9 F (36.6 C)   Wt 253 lb (114.8 kg)   SpO2 99%   BMI 34.31 kg/m  Wt Readings from Last 3 Encounters:  05/05/21 253 lb (114.8 kg)  05/03/21 254 lb 6.4 oz (115.4 kg)  01/21/21 256 lb 6.4 oz (116.3 kg)   General Appearance: Well nourished, in no apparent distress. Eyes: PERRLA, EOMs, conjunctiva no swelling or erythema Sinuses: No Frontal/maxillary tenderness ENT/Mouth: Ext aud canals clear, TMs without erythema, bulging. No erythema, swelling, or exudate on post pharynx.  Tonsils not swollen or erythematous. Hearing normal.  Neck: Supple, thyroid normal.  Respiratory: Respiratory effort normal, BS equal bilaterally without rales, rhonchi, wheezing or stridor.  Cardio: RRR with no MRGs. Brisk peripheral pulses without edema.  Abdomen: Soft, + BS.  Non tender, no guarding, rebound, hernias, masses. Lymphatics: Non tender without lymphadenopathy.  Musculoskeletal: Full ROM, 5/5 strength, Normal gait Skin: Warm, dry without rashes, lesions, ecchymosis.  Neuro: Cranial nerves intact. No cerebellar symptoms.  Psych: Awake and oriented X 3, normal affect, Insight and Judgment appropriate.    Izora Ribas, NP 11:55 AM [facility]

## 2021-05-05 ENCOUNTER — Ambulatory Visit (INDEPENDENT_AMBULATORY_CARE_PROVIDER_SITE_OTHER): Payer: Commercial Managed Care - PPO | Admitting: Adult Health

## 2021-05-05 ENCOUNTER — Other Ambulatory Visit: Payer: Self-pay

## 2021-05-05 ENCOUNTER — Encounter: Payer: Self-pay | Admitting: Adult Health

## 2021-05-05 VITALS — BP 168/96 | HR 83 | Temp 97.9°F | Wt 253.0 lb

## 2021-05-05 DIAGNOSIS — Z79899 Other long term (current) drug therapy: Secondary | ICD-10-CM

## 2021-05-05 DIAGNOSIS — E1122 Type 2 diabetes mellitus with diabetic chronic kidney disease: Secondary | ICD-10-CM | POA: Diagnosis not present

## 2021-05-05 DIAGNOSIS — E119 Type 2 diabetes mellitus without complications: Secondary | ICD-10-CM | POA: Diagnosis not present

## 2021-05-05 DIAGNOSIS — E669 Obesity, unspecified: Secondary | ICD-10-CM

## 2021-05-05 DIAGNOSIS — E559 Vitamin D deficiency, unspecified: Secondary | ICD-10-CM

## 2021-05-05 DIAGNOSIS — E1169 Type 2 diabetes mellitus with other specified complication: Secondary | ICD-10-CM | POA: Diagnosis not present

## 2021-05-05 DIAGNOSIS — I10 Essential (primary) hypertension: Secondary | ICD-10-CM | POA: Diagnosis not present

## 2021-05-05 DIAGNOSIS — E66811 Obesity, class 1: Secondary | ICD-10-CM

## 2021-05-05 DIAGNOSIS — K760 Fatty (change of) liver, not elsewhere classified: Secondary | ICD-10-CM

## 2021-05-05 DIAGNOSIS — M1000 Idiopathic gout, unspecified site: Secondary | ICD-10-CM

## 2021-05-05 DIAGNOSIS — E785 Hyperlipidemia, unspecified: Secondary | ICD-10-CM

## 2021-05-05 DIAGNOSIS — N182 Chronic kidney disease, stage 2 (mild): Secondary | ICD-10-CM

## 2021-05-05 MED ORDER — MOUNJARO 7.5 MG/0.5ML ~~LOC~~ SOAJ: 7.5000 mg | SUBCUTANEOUS | 0 refills | Status: DC

## 2021-05-05 NOTE — Patient Instructions (Addendum)
Goals      Blood Pressure < 130/80     Fasting Blood Glucose <130     Check fasting glucose daily and keep a log to bring to each appointment     HEMOGLOBIN A1C < 7.0     LDL CALC < 70     Weight (lb) < 250 lb (113.4 kg)       Please STOP anything with sudafed, or cold or allergy meds with -D on the end  Saline irrigations, flonase, coridcidin HBP are ok to use for nasal congestion  Make sure BP at home at minimum is <140/80   Medicines you can use  Nasal congestion [facility] saline spray (aerosol/mist)- can try this, it is in the kids section - pseudoephedrine (Sudafed)- behind the counter, do not use if you have high blood pressure, medicine that have -D in them. - phenylephrine (Sudafed PE) -Dextormethorphan + chlorpheniramine (Coridcidin HBP)- okay if you have high blood pressure -Oxymetazoline (Afrin) nasal spray- LIMIT to 3 days -Saline nasal spray -Neti pot (used distilled or bottled water)     HYPERTENSION INFORMATION  Monitor your blood pressure at home, please keep a record and bring that in with you to your next office visit.   Go to the ER if any CP, SOB, nausea, dizziness, severe HA, changes vision/speech  Testing/Procedures: HOW TO TAKE YOUR BLOOD PRESSURE: Rest 5 minutes before taking your blood pressure. Don't smoke or drink caffeinated beverages for at least 30 minutes before. Take your blood pressure before (not after) you eat. Sit comfortably with your back supported and both feet on the floor (don't cross your legs). Elevate your arm to heart level on a table or a desk. Use the proper sized cuff. It should fit smoothly and snugly around your bare upper arm. There should be enough room to slip a fingertip under the cuff. The bottom edge of the cuff should be 1 inch above the crease of the elbow.  Bare minimum BP number is <130/80 however -   A recent study, SPRINT trial showed patients with the goal BP at 120 had LESS DEMENTIA, LESS  HEART ATTACKS, AND LESS STROKES, AS WELL AS OVERALL DECREASED MORTALITY OR DEATH RATE.   If you are willing, our goal BP is the top number of 120.   Your most recent BP: BP: (!) 168/96   Take your medications faithfully as instructed. Maintain a healthy weight. Get at least 150 minutes of aerobic exercise per week. Minimize salt intake. Minimize alcohol intake  DASH Eating Plan DASH stands for "Dietary Approaches to Stop Hypertension." The DASH eating plan is a healthy eating plan that has been shown to reduce high blood pressure (hypertension). Additional health benefits may include reducing the risk of type 2 diabetes mellitus, heart disease, and stroke. The DASH eating plan may also help with weight loss. WHAT DO I NEED TO KNOW ABOUT THE DASH EATING PLAN? For the DASH eating plan, you will follow these general guidelines: Choose foods with a percent daily value for sodium of less than 5% (as listed on the food label). Use salt-free seasonings or herbs instead of table salt or sea salt. Check with your health care provider or pharmacist before using salt substitutes. Eat lower-sodium products, often labeled as "lower sodium" or "no salt added." Eat fresh foods. Eat more vegetables, fruits, and low-fat dairy products. Choose whole grains. Look for the word "whole" as the first word in the ingredient list. Choose fish and skinless chicken  or [place] more often than red meat. Limit fish, poultry, and meat to 6 oz (170 g) each day. Limit sweets, desserts, sugars, and sugary drinks. Choose heart-healthy fats. Limit cheese to 1 oz (28 g) per day. Eat more home-cooked food and less restaurant, buffet, and fast food. Limit fried foods. Cook foods using methods other than frying. Limit canned vegetables. If you do use them, rinse them well to decrease the sodium. When eating at a restaurant, ask that your food be prepared with less salt, or no salt if possible. WHAT FOODS CAN I EAT? Seek  help from a dietitian for individual calorie needs. Grains Whole grain or whole wheat bread. Brown rice. Whole grain or whole wheat pasta. Quinoa, bulgur, and whole grain cereals. Low-sodium cereals. Corn or whole wheat flour tortillas. Whole grain cornbread. Whole grain crackers. Low-sodium crackers. Vegetables Fresh or frozen vegetables (raw, steamed, roasted, or grilled). Low-sodium or reduced-sodium tomato and vegetable juices. Low-sodium or reduced-sodium tomato sauce and paste. Low-sodium or reduced-sodium canned vegetables.  Fruits All fresh, canned (in natural juice), or frozen fruits. Meat and Other Protein Products Ground beef (85% or leaner), grass-fed beef, or beef trimmed of fat. Skinless chicken or [place]. Ground chicken or [place]. Pork trimmed of fat. All fish and seafood. Eggs. Dried beans, peas, or lentils. Unsalted nuts and seeds. Unsalted canned beans. Dairy Low-fat dairy products, such as skim or 1% milk, 2% or reduced-fat cheeses, low-fat ricotta or cottage cheese, or plain low-fat yogurt. Low-sodium or reduced-sodium cheeses. Fats and Oils Tub margarines without trans fats. Light or reduced-fat mayonnaise and salad dressings (reduced sodium). Avocado. Safflower, olive, or canola oils. Natural peanut or almond butter. Other Unsalted popcorn and pretzels. The items listed above may not be a complete list of recommended foods or beverages. Contact your dietitian for more options. WHAT FOODS ARE NOT RECOMMENDED? Grains White bread. White pasta. White rice. Refined cornbread. Bagels and croissants. Crackers that contain trans fat. Vegetables Creamed or fried vegetables. Vegetables in a cheese sauce. Regular canned vegetables. Regular canned tomato sauce and paste. Regular tomato and vegetable juices. Fruits Dried fruits. Canned fruit in light or heavy syrup. Fruit juice. Meat and Other Protein Products Fatty cuts of meat. Ribs, chicken wings, bacon, sausage, bologna, salami,  chitterlings, fatback, hot dogs, bratwurst, and packaged luncheon meats. Salted nuts and seeds. Canned beans with salt. Dairy Whole or 2% milk, cream, half-and-half, and cream cheese. Whole-fat or sweetened yogurt. Full-fat cheeses or blue cheese. Nondairy creamers and whipped toppings. Processed cheese, cheese spreads, or cheese curds. Condiments Onion and garlic salt, seasoned salt, table salt, and sea salt. Canned and packaged gravies. Worcestershire sauce. Tartar sauce. Barbecue sauce. Teriyaki sauce. Soy sauce, including reduced sodium. Steak sauce. Fish sauce. Oyster sauce. Cocktail sauce. Horseradish. Ketchup and mustard. Meat flavorings and tenderizers. Bouillon cubes. Hot sauce. Tabasco sauce. Marinades. Taco seasonings. Relishes. Fats and Oils Butter, stick margarine, lard, shortening, ghee, and bacon fat. Coconut, palm kernel, or palm oils. Regular salad dressings. Other Pickles and olives. Salted popcorn and pretzels. The items listed above may not be a complete list of foods and beverages to avoid. Contact your dietitian for more information. WHERE CAN I FIND MORE INFORMATION? National Heart, Lung, and [employer] Institute: travelstabloid.com Document Released: 05/05/2011 Document Revised: 09/30/2013 Document Reviewed: 03/20/2013 [facility] Patient Information 2015 [place], [place]. This information is not intended to replace advice given to you by your health care provider. Make sure you discuss any questions you have with your health care provider.

## 2021-05-06 ENCOUNTER — Other Ambulatory Visit: Payer: Self-pay | Admitting: Adult Health

## 2021-05-06 DIAGNOSIS — E785 Hyperlipidemia, unspecified: Secondary | ICD-10-CM

## 2021-05-06 DIAGNOSIS — E1169 Type 2 diabetes mellitus with other specified complication: Secondary | ICD-10-CM

## 2021-05-06 MED ORDER — ROSUVASTATIN CALCIUM 10 MG PO TABS: ORAL_TABLET | ORAL | 3 refills | Status: DC

## 2021-05-07 LAB — LIPID PANEL
Cholesterol: 205 mg/dL — ABNORMAL HIGH (ref <200)
HDL: 46 mg/dL (ref >=40)
LDL Cholesterol (Calc): 132 mg/dL (calc) — ABNORMAL HIGH
Non-HDL Cholesterol (Calc): 159 mg/dL (calc) — ABNORMAL HIGH (ref <130)
Total CHOL/HDL Ratio: 4.5 (calc) (ref <5.0)
Triglycerides: 156 mg/dL — ABNORMAL HIGH (ref <150)

## 2021-05-07 LAB — COMPLETE METABOLIC PANEL WITH GFR
AG Ratio: 1.5 (calc) (ref 1.0–2.5)
ALT: 34 U/L (ref 9–46)
AST: 22 U/L (ref 10–35)
Albumin: 4.5 g/dL (ref 3.6–5.1)
Alkaline phosphatase (APISO): 44 U/L (ref 35–144)
BUN: 21 mg/dL (ref 7–25)
CO2: 23 mmol/L (ref 20–32)
Calcium: 9.4 mg/dL (ref 8.6–10.3)
Chloride: 103 mmol/L (ref 98–110)
Creat: 0.9 mg/dL (ref 0.70–1.35)
Globulin: 3.1 g/dL (calc) (ref 1.9–3.7)
Glucose, Bld: 251 mg/dL — ABNORMAL HIGH (ref 65–99)
Potassium: 4.2 mmol/L (ref 3.5–5.3)
Sodium: 137 mmol/L (ref 135–146)
Total Bilirubin: 0.5 mg/dL (ref 0.2–1.2)
Total Protein: 7.6 g/dL (ref 6.1–8.1)
eGFR: 98 mL/min/{1.73_m2} (ref >=60)

## 2021-05-07 LAB — CBC WITH DIFFERENTIAL/PLATELET
Absolute Monocytes: 389 cells/uL (ref 200–950)
Basophils Absolute: 12 cells/uL (ref 0–200)
Basophils Relative: 0.1 %
Eosinophils Absolute: 0 cells/uL — ABNORMAL LOW (ref 15–500)
Eosinophils Relative: 0 %
HCT: 48.2 % (ref 38.5–50.0)
Hemoglobin: 16.4 g/dL (ref 13.2–17.1)
Lymphs Abs: 1239 cells/uL (ref 850–3900)
MCH: 32.1 pg (ref 27.0–33.0)
MCHC: 34 g/dL (ref 32.0–36.0)
MCV: 94.3 fL (ref 80.0–100.0)
MPV: 10.7 fL (ref 7.5–12.5)
Monocytes Relative: 3.3 %
Neutro Abs: 10160 cells/uL — ABNORMAL HIGH (ref 1500–7800)
Neutrophils Relative %: 86.1 %
Platelets: 241 10*3/uL (ref 140–400)
RBC: 5.11 10*6/uL (ref 4.20–5.80)
RDW: 11.9 % (ref 11.0–15.0)
Total Lymphocyte: 10.5 %
WBC: 11.8 10*3/uL — ABNORMAL HIGH (ref 3.8–10.8)

## 2021-05-07 LAB — HEMOGLOBIN A1C
Hgb A1c MFr Bld: 6.5 % of total Hgb — ABNORMAL HIGH (ref <5.7)
Mean Plasma Glucose: 140 mg/dL
eAG (mmol/L): 7.7 mmol/L

## 2021-05-07 LAB — TSH: TSH: 1.24 mIU/L (ref 0.40–4.50)

## 2021-05-07 LAB — MAGNESIUM: Magnesium: 1.9 mg/dL (ref 1.5–2.5)

## 2021-05-14 ENCOUNTER — Other Ambulatory Visit: Payer: Self-pay | Admitting: Internal Medicine

## 2021-05-14 DIAGNOSIS — E1122 Type 2 diabetes mellitus with diabetic chronic kidney disease: Secondary | ICD-10-CM

## 2021-05-14 DIAGNOSIS — N182 Chronic kidney disease, stage 2 (mild): Secondary | ICD-10-CM

## 2021-05-14 DIAGNOSIS — I10 Essential (primary) hypertension: Secondary | ICD-10-CM

## 2021-05-15 ENCOUNTER — Other Ambulatory Visit: Payer: Self-pay | Admitting: Adult Health

## 2021-05-15 DIAGNOSIS — E1122 Type 2 diabetes mellitus with diabetic chronic kidney disease: Secondary | ICD-10-CM

## 2021-05-15 MED ORDER — METFORMIN HCL ER 500 MG PO TB24: ORAL_TABLET | ORAL | 3 refills | Status: DC

## 2021-05-17 ENCOUNTER — Other Ambulatory Visit: Payer: Self-pay | Admitting: Nurse Practitioner

## 2021-05-17 DIAGNOSIS — N182 Chronic kidney disease, stage 2 (mild): Secondary | ICD-10-CM

## 2021-05-27 ENCOUNTER — Ambulatory Visit: Payer: Commercial Managed Care - PPO | Admitting: Adult Health

## 2021-05-28 NOTE — Progress Notes (Signed)
Assessment and Plan:  Paul Warner was seen today for follow-up.  Diagnoses and all orders for this visit:  Primary hypertension BP med refilled, home logs are irregular but well controlled numbers reported. ? White coat vs no med today vs ? Accuracy of home BP cuff. Would like to see him back with full daily BP log, home BP cuff in the next month, no med dose change based on home values.  Monitor blood pressure at home; call if consistently over 130/80 Continue DASH diet.   Reminder to go to the ER if any CP, SOB, nausea, dizziness, severe HA, changes vision/speech, left arm numbness and tingling and jaw pain. -     olmesartan-hydrochlorothiazide (BENICAR HCT) 20-12.5 MG tablet; TAKE 1 TABLET DAILY FOR BLOOD PRESSURE  Further disposition pending results of labs. Discussed med's effects and SE's.   Over 15 minutes of exam, counseling, chart review, and critical decision making was performed.   Future Appointments  Date Time Provider [place]  08/19/2021 11:00 AM [name], MD [facility] None    ------------------------------------------------------------------------------------------------------------------   HPI BP (!) 148/88    Pulse 93    Temp 97.9 F (36.6 C)    Wt 254 lb (115.2 kg)    SpO2 (!) 87%    BMI 34.45 kg/m  60 y.o.male presents for 2 week follow up on htn.   On benicar HCT 20/12.5 mg, no dose change, this is recheck off of sudafed (reports URI sx fully resolved). He reports ran out of BP med yesterday, hasn't taken today. He forgot log, but does have photo of BP reading from yesterday, 122/74. He admits doesn't check very frequently.   Today their BP is BP: (!) 148/88, recheck by provider was 160/86.  He denies chest pain, shortness of breath, dizziness.   Past Medical History:  Diagnosis Date   Allergy    seasonal   Arthritis    Blood transfusion 09/2011   2 units   Diabetes mellitus without complication ([place])    History of adenomatous polyp of  colon 10/07/2019   Due 10/03/2022 per Dr. Henrene Pastor   Hyperlipidemia    Hypertension    Renal disorder    Stomach ulcer      Allergies  Allergen Reactions   Codeine Nausea And Vomiting   Nsaids Other (See Comments)    Bleeding ulcer 2013    Current Outpatient Medications on File Prior to Visit  Medication Sig   aspirin 81 MG tablet Take 81 mg by mouth daily. Reported on 09/23/2015   benzonatate (TESSALON PERLES) 100 MG capsule Take 2 capsules (200 mg total) by mouth 3 (three) times daily as needed for cough (Max: 600mg  per day).   Blood Glucose Monitoring Suppl (FREESTYLE LITE) DEVI 1 Device by Does not apply route 3 (three) times daily.   Cholecalciferol (VITAMIN D PO) Take 10,000 Units by mouth daily.   glipiZIDE (GLUCOTROL) 5 MG tablet TAKE 1 TABLET 3 TIMES A DAY WITH MEALS FOR DIABETES   glucose blood (FREESTYLE LITE) test strip Check blood sugar 1 to 2 times daily. DX-E11.9   Lancets (FREESTYLE) lancets Use as instructed   meloxicam (MOBIC) 7.5 MG tablet TAKE 1 TABLET (7.5 MG TOTAL) BY MOUTH DAILY FOR TWO WEEKS, THEN AS NEEDED.   metFORMIN (GLUCOPHAGE-XR) 500 MG 24 hr tablet TAKE 2 TABLETS 2 TIMES A DAY WITH MEALS FOR DIABETES   Omega-3 Fatty Acids (FISH OIL OMEGA-3 PO) Take 1 capsule by mouth daily.   rosuvastatin (CRESTOR) 10 MG tablet Take  1 tablet  Daily  for Cholesterol   tirzepatide (MOUNJARO) 7.5 MG/0.5ML Pen Inject 7.5 mg into the skin once a week.   Zinc 50 MG TABS Take 1 tablet Daily   No current facility-administered medications on file prior to visit.    ROS: all negative except above.   Physical Exam:  BP (!) 148/88    Pulse 93    Temp 97.9 F (36.6 C)    Wt 254 lb (115.2 kg)    SpO2 (!) 87%    BMI 34.45 kg/m   General Appearance: Well nourished, in no apparent distress. Eyes: PERRLA, conjunctiva no swelling or erythema ENT/Mouth: mask in place; Hearing normal.  Neck: Supple, thyroid normal.  Respiratory: Respiratory effort normal, BS equal bilaterally without  rales, rhonchi, wheezing or stridor.  Cardio: RRR with no MRGs. Brisk peripheral pulses without edema.  Abdomen: Soft, + BS.  Non tender, no guarding, rebound, hernias, masses. Lymphatics: Non tender without lymphadenopathy.  Musculoskeletal: no obvious deformity; normal gait.  Skin: Warm, dry without rashes, lesions, ecchymosis.  Neuro: Normal muscle tone Psych: Awake and oriented X 3, normal affect, Insight and Judgment appropriate.    Izora Ribas, NP 9:00 AM [facility]

## 2021-06-01 ENCOUNTER — Other Ambulatory Visit: Payer: Self-pay

## 2021-06-01 ENCOUNTER — Encounter: Payer: Self-pay | Admitting: Adult Health

## 2021-06-01 ENCOUNTER — Ambulatory Visit (INDEPENDENT_AMBULATORY_CARE_PROVIDER_SITE_OTHER): Payer: Commercial Managed Care - PPO | Admitting: Adult Health

## 2021-06-01 VITALS — BP 160/86 | HR 93 | Temp 97.9°F | Wt 254.0 lb

## 2021-06-01 DIAGNOSIS — I10 Essential (primary) hypertension: Secondary | ICD-10-CM

## 2021-06-01 MED ORDER — OLMESARTAN MEDOXOMIL-HCTZ 20-12.5 MG PO TABS: ORAL_TABLET | ORAL | 1 refills | Status: DC

## 2021-06-16 ENCOUNTER — Ambulatory Visit: Payer: Commercial Managed Care - PPO | Admitting: Adult Health

## 2021-06-25 NOTE — Progress Notes (Deleted)
Assessment and Plan:  Paul Warner was seen today for follow-up.  Diagnoses and all orders for this visit:  Primary hypertension BP med refilled, home logs are irregular but well controlled numbers reported. ? White coat vs no med today vs ? Accuracy of home BP cuff. Would like to see him back with full daily BP log, home BP cuff in the next month, no med dose change based on home values.  Monitor blood pressure at home; call if consistently over 130/80 Continue DASH diet.   Reminder to go to the ER if any CP, SOB, nausea, dizziness, severe HA, changes vision/speech, left arm numbness and tingling and jaw pain. -     olmesartan-hydrochlorothiazide (BENICAR HCT) 20-12.5 MG tablet; TAKE 1 TABLET DAILY FOR BLOOD PRESSURE  Further disposition pending results of labs. Discussed med's effects and SE's.   Over 15 minutes of exam, counseling, chart review, and critical decision making was performed.   Future Appointments  Date Time Provider [place]  06/29/2021  3:30 PM [name], NP [facility] None  08/19/2021 11:00 AM [name], MD [facility] None    ------------------------------------------------------------------------------------------------------------------   HPI There were no vitals taken for this visit. 61 y.o.male presents for 2 week follow up on htn.   On benicar HCT 20/12.5 mg, no dose change, this is recheck off of sudafed (reports URI sx fully resolved). He reports ran out of BP med yesterday, hasn't taken today. He forgot log, but does have photo of BP reading from yesterday, 122/74. He admits doesn't check very frequently.   Today their BP is  , recheck by provider was 160/86.  BP Readings from Last 3 Encounters:  06/01/21 (!) 160/86  05/05/21 (!) 168/96  05/03/21 (!) 160/88  He denies chest pain, shortness of breath, dizziness.   Past Medical History:  Diagnosis Date   Allergy    seasonal   Arthritis    Blood transfusion 09/2011   2 units    Diabetes mellitus without complication ([place])    History of adenomatous polyp of colon 10/07/2019   Due 10/03/2022 per Dr. Henrene Pastor   Hyperlipidemia    Hypertension    Renal disorder    Stomach ulcer      Allergies  Allergen Reactions   Codeine Nausea And Vomiting   Nsaids Other (See Comments)    Bleeding ulcer 2013    Current Outpatient Medications on File Prior to Visit  Medication Sig   aspirin 81 MG tablet Take 81 mg by mouth daily. Reported on 09/23/2015   benzonatate (TESSALON PERLES) 100 MG capsule Take 2 capsules (200 mg total) by mouth 3 (three) times daily as needed for cough (Max: 600mg  per day).   Blood Glucose Monitoring Suppl (FREESTYLE LITE) DEVI 1 Device by Does not apply route 3 (three) times daily.   Cholecalciferol (VITAMIN D PO) Take 10,000 Units by mouth daily.   glipiZIDE (GLUCOTROL) 5 MG tablet TAKE 1 TABLET 3 TIMES A DAY WITH MEALS FOR DIABETES   glucose blood (FREESTYLE LITE) test strip Check blood sugar 1 to 2 times daily. DX-E11.9   Lancets (FREESTYLE) lancets Use as instructed   meloxicam (MOBIC) 7.5 MG tablet TAKE 1 TABLET (7.5 MG TOTAL) BY MOUTH DAILY FOR TWO WEEKS, THEN AS NEEDED.   metFORMIN (GLUCOPHAGE-XR) 500 MG 24 hr tablet TAKE 2 TABLETS 2 TIMES A DAY WITH MEALS FOR DIABETES   olmesartan-hydrochlorothiazide (BENICAR HCT) 20-12.5 MG tablet TAKE 1 TABLET DAILY FOR BLOOD PRESSURE   Omega-3 Fatty Acids (FISH OIL OMEGA-3 PO)  Take 1 capsule by mouth daily.   rosuvastatin (CRESTOR) 10 MG tablet Take  1 tablet  Daily  for Cholesterol   tirzepatide (MOUNJARO) 7.5 MG/0.5ML Pen Inject 7.5 mg into the skin once a week.   Zinc 50 MG TABS Take 1 tablet Daily   No current facility-administered medications on file prior to visit.    ROS: all negative except above.   Physical Exam:  There were no vitals taken for this visit.  General Appearance: Well nourished, in no apparent distress. Eyes: PERRLA, conjunctiva no swelling or erythema ENT/Mouth: mask in place;  Hearing normal.  Neck: Supple, thyroid normal.  Respiratory: Respiratory effort normal, BS equal bilaterally without rales, rhonchi, wheezing or stridor.  Cardio: RRR with no MRGs. Brisk peripheral pulses without edema.  Abdomen: Soft, + BS.  Non tender, no guarding, rebound, hernias, masses. Lymphatics: Non tender without lymphadenopathy.  Musculoskeletal: no obvious deformity; normal gait.  Skin: Warm, dry without rashes, lesions, ecchymosis.  Neuro: Normal muscle tone Psych: Awake and oriented X 3, normal affect, Insight and Judgment appropriate.    Izora Ribas, NP 5:39 PM [facility]

## 2021-06-29 ENCOUNTER — Ambulatory Visit: Payer: Self-pay | Admitting: Adult Health

## 2021-06-29 ENCOUNTER — Other Ambulatory Visit: Payer: Self-pay | Admitting: Adult Health

## 2021-06-29 DIAGNOSIS — I10 Essential (primary) hypertension: Secondary | ICD-10-CM

## 2021-06-30 NOTE — Progress Notes (Deleted)
Assessment and Plan:  There are no diagnoses linked to this encounter.    Further disposition pending results of labs. Discussed med's effects and SE's.   Over 30 minutes of exam, counseling, chart review, and critical decision making was performed.   Future Appointments  Date Time Provider [place]  07/01/2021  3:30 PM Magda Bernheim, NP [facility] None  08/19/2021 11:00 AM [name], MD [facility] None    ------------------------------------------------------------------------------------------------------------------   HPI There were no vitals taken for this visit. 61 y.o.male presents for  Past Medical History:  Diagnosis Date   Allergy    seasonal   Arthritis    Blood transfusion 09/2011   2 units   Diabetes mellitus without complication ([place])    History of adenomatous polyp of colon 10/07/2019   Due 10/03/2022 per Dr. Henrene Pastor   Hyperlipidemia    Hypertension    Renal disorder    Stomach ulcer      Allergies  Allergen Reactions   Codeine Nausea And Vomiting   Nsaids Other (See Comments)    Bleeding ulcer 2013    Current Outpatient Medications on File Prior to Visit  Medication Sig   aspirin 81 MG tablet Take 81 mg by mouth daily. Reported on 09/23/2015   benzonatate (TESSALON PERLES) 100 MG capsule Take 2 capsules (200 mg total) by mouth 3 (three) times daily as needed for cough (Max: 600mg  per day).   Blood Glucose Monitoring Suppl (FREESTYLE LITE) DEVI 1 Device by Does not apply route 3 (three) times daily.   Cholecalciferol (VITAMIN D PO) Take 10,000 Units by mouth daily.   glipiZIDE (GLUCOTROL) 5 MG tablet TAKE 1 TABLET 3 TIMES A DAY WITH MEALS FOR DIABETES   glucose blood (FREESTYLE LITE) test strip Check blood sugar 1 to 2 times daily. DX-E11.9   Lancets (FREESTYLE) lancets Use as instructed   meloxicam (MOBIC) 7.5 MG tablet TAKE 1 TABLET (7.5 MG TOTAL) BY MOUTH DAILY FOR TWO WEEKS, THEN AS NEEDED.   metFORMIN (GLUCOPHAGE-XR) 500 MG 24 hr tablet  TAKE 2 TABLETS 2 TIMES A DAY WITH MEALS FOR DIABETES   MOUNJARO 7.5 MG/0.5ML Pen INJECT 7.5 MG SUBCUTANEOUSLY WEEKLY   olmesartan-hydrochlorothiazide (BENICAR HCT) 20-12.5 MG tablet TAKE 1 TABLET DAILY FOR BLOOD PRESSURE   Omega-3 Fatty Acids (FISH OIL OMEGA-3 PO) Take 1 capsule by mouth daily.   rosuvastatin (CRESTOR) 10 MG tablet Take  1 tablet  Daily  for Cholesterol   Zinc 50 MG TABS Take 1 tablet Daily   No current facility-administered medications on file prior to visit.    ROS: all negative except above.   Physical Exam:  There were no vitals taken for this visit.  General Appearance: Well nourished, in no apparent distress. Eyes: PERRLA, EOMs, conjunctiva no swelling or erythema Sinuses: No Frontal/maxillary tenderness ENT/Mouth: Ext aud canals clear, TMs without erythema, bulging. No erythema, swelling, or exudate on post pharynx.  Tonsils not swollen or erythematous. Hearing normal.  Neck: Supple, thyroid normal.  Respiratory: Respiratory effort normal, BS equal bilaterally without rales, rhonchi, wheezing or stridor.  Cardio: RRR with no MRGs. Brisk peripheral pulses without edema.  Abdomen: Soft, + BS.  Non tender, no guarding, rebound, hernias, masses. Lymphatics: Non tender without lymphadenopathy.  Musculoskeletal: Full ROM, 5/5 strength, normal gait.  Skin: Warm, dry without rashes, lesions, ecchymosis.  Neuro: Cranial nerves intact. Normal muscle tone, no cerebellar symptoms. Sensation intact.  Psych: Awake and oriented X 3, normal affect, Insight and Judgment appropriate.  Magda Bernheim, NP 10:03 AM [facility]

## 2021-07-01 ENCOUNTER — Ambulatory Visit: Payer: Self-pay | Admitting: Nurse Practitioner

## 2021-07-06 NOTE — Progress Notes (Signed)
Assessment and Plan:  Vilas was seen today for follow-up.  Diagnoses and all orders for this visit:  Essential hypertension - continue medications, DASH diet, exercise and monitor at home. Call if greater than 130/80.   -Home BP cuff was not reading accurately- will get new BP cuff and keep home log -Go to the ER if any chest pain, shortness of breath, nausea, dizziness, severe HA, changes vision/speech   Type 2 diabetes mellitus with stage 2 chronic kidney disease, without long-term current use of insulin (HCC) Continue diet and exercise -     tirzepatide (MOUNJARO) 12.5 MG/0.5ML Pen; Inject 12.5 mg into the skin once a week.  Class 2 severe obesity due to excess calories with serious comorbidity and body mass index (BMI) of 38.0 to 38.9 in adult [facility]) Continue diet and encourage more exercise Mounjaro increased to 12.5mg  weekly       Further disposition pending results of labs. Discussed med's effects and SE's.   Over 30 minutes of exam, counseling, chart review, and critical decision making was performed.   Future Appointments  Date Time Provider [place]  08/19/2021 11:00 AM [name], MD [facility] None    ------------------------------------------------------------------------------------------------------------------   HPI BP 138/78    Pulse 82    Temp 97.7 F (36.5 C)    Wt 260 lb (117.9 kg)    SpO2 97%    BMI 35.26 kg/m   61 y.o.male presents for recheck of blood pressure. Currently on Benicar HCT 20/12.5 daily. Bp cuff from home brought today and read 152/92 while value here was 138/78, recheck by myself was 128/80 BP Readings from Last 3 Encounters:  07/07/21 138/78  06/01/21 (!) 160/86  05/05/21 (!) 168/96    BMI is Body mass index is 35.26 kg/m., he has been working on diet and exercise. Started [place] about a month ago , not controlling appetite currently. He is not exercising but does a lot of walking and lifting at work.  Wt Readings  from Last 3 Encounters:  07/07/21 260 lb (117.9 kg)  06/01/21 254 lb (115.2 kg)  05/05/21 253 lb (114.8 kg)     Past Medical History:  Diagnosis Date   Allergy    seasonal   Arthritis    Blood transfusion 09/2011   2 units   Diabetes mellitus without complication ([place])    History of adenomatous polyp of colon 10/07/2019   Due 10/03/2022 per Dr. Henrene Pastor   Hyperlipidemia    Hypertension    Renal disorder    Stomach ulcer      Allergies  Allergen Reactions   Codeine Nausea And Vomiting   Nsaids Other (See Comments)    Bleeding ulcer 2013    Current Outpatient Medications on File Prior to Visit  Medication Sig   aspirin 81 MG tablet Take 81 mg by mouth daily. Reported on 09/23/2015   Cholecalciferol (VITAMIN D PO) Take 10,000 Units by mouth daily.   glipiZIDE (GLUCOTROL) 5 MG tablet TAKE 1 TABLET 3 TIMES A DAY WITH MEALS FOR DIABETES   meloxicam (MOBIC) 7.5 MG tablet TAKE 1 TABLET (7.5 MG TOTAL) BY MOUTH DAILY FOR TWO WEEKS, THEN AS NEEDED.   metFORMIN (GLUCOPHAGE-XR) 500 MG 24 hr tablet TAKE 2 TABLETS 2 TIMES A DAY WITH MEALS FOR DIABETES   MOUNJARO 7.5 MG/0.5ML Pen INJECT 7.5 MG SUBCUTANEOUSLY WEEKLY   Multiple Vitamins-Minerals (MULTIVITAMIN WITH MINERALS) tablet Take 1 tablet by mouth daily.   olmesartan-hydrochlorothiazide (BENICAR HCT) 20-12.5 MG tablet TAKE 1 TABLET DAILY FOR  BLOOD PRESSURE   Omega-3 Fatty Acids (FISH OIL OMEGA-3 PO) Take 1 capsule by mouth daily.   Zinc 50 MG TABS Take 1 tablet Daily   benzonatate (TESSALON PERLES) 100 MG capsule Take 2 capsules (200 mg total) by mouth 3 (three) times daily as needed for cough (Max: 600mg  per day). (Patient not taking: Reported on 07/07/2021)   Blood Glucose Monitoring Suppl (FREESTYLE LITE) DEVI 1 Device by Does not apply route 3 (three) times daily.   glucose blood (FREESTYLE LITE) test strip Check blood sugar 1 to 2 times daily. DX-E11.9   Lancets (FREESTYLE) lancets Use as instructed   rosuvastatin (CRESTOR) 10 MG tablet  Take  1 tablet  Daily  for Cholesterol (Patient not taking: Reported on 07/07/2021)   No current facility-administered medications on file prior to visit.    ROS: all negative except above.   Physical Exam:  BP 138/78    Pulse 82    Temp 97.7 F (36.5 C)    Wt 260 lb (117.9 kg)    SpO2 97%    BMI 35.26 kg/m   General Appearance: Very pleasant obese male, in no apparent distress. Eyes: PERRLA, EOMs, conjunctiva no swelling or erythema Sinuses: No Frontal/maxillary tenderness ENT/Mouth: Ext aud canals clear, TMs without erythema, bulging. No erythema, swelling, or exudate on post pharynx.  Tonsils not swollen or erythematous. Hearing normal.  Neck: Supple, thyroid normal.  Respiratory: Respiratory effort normal, BS equal bilaterally without rales, rhonchi, wheezing or stridor.  Cardio: RRR with no MRGs. Brisk peripheral pulses without edema.  Abdomen: Soft, + BS.  Non tender, no guarding, rebound, hernias, masses. Lymphatics: Non tender without lymphadenopathy.  Musculoskeletal: Full ROM, 5/5 strength, normal gait.  Skin: Warm, dry without rashes, lesions, ecchymosis.  Neuro: Cranial nerves intact. Normal muscle tone, no cerebellar symptoms. Sensation intact.  Psych: Awake and oriented X 3, normal affect, Insight and Judgment appropriate.     Magda Bernheim, NP 3:55 PM [facility]

## 2021-07-07 ENCOUNTER — Other Ambulatory Visit: Payer: Self-pay

## 2021-07-07 ENCOUNTER — Ambulatory Visit (INDEPENDENT_AMBULATORY_CARE_PROVIDER_SITE_OTHER): Payer: Commercial Managed Care - PPO | Admitting: Nurse Practitioner

## 2021-07-07 ENCOUNTER — Encounter: Payer: Self-pay | Admitting: Nurse Practitioner

## 2021-07-07 VITALS — BP 138/78 | HR 82 | Temp 97.7°F | Wt 260.0 lb

## 2021-07-07 DIAGNOSIS — I10 Essential (primary) hypertension: Secondary | ICD-10-CM | POA: Diagnosis not present

## 2021-07-07 DIAGNOSIS — Z6838 Body mass index (BMI) 38.0-38.9, adult: Secondary | ICD-10-CM

## 2021-07-07 DIAGNOSIS — E1122 Type 2 diabetes mellitus with diabetic chronic kidney disease: Secondary | ICD-10-CM

## 2021-07-07 DIAGNOSIS — N182 Chronic kidney disease, stage 2 (mild): Secondary | ICD-10-CM

## 2021-07-07 MED ORDER — MOUNJARO 12.5 MG/0.5ML ~~LOC~~ SOAJ: 12.5000 mg | SUBCUTANEOUS | 3 refills | Status: DC

## 2021-08-10 ENCOUNTER — Encounter: Payer: Self-pay | Admitting: Internal Medicine

## 2021-08-11 ENCOUNTER — Other Ambulatory Visit: Payer: Self-pay

## 2021-08-11 ENCOUNTER — Emergency Department (HOSPITAL_BASED_OUTPATIENT_CLINIC_OR_DEPARTMENT_OTHER): Payer: Commercial Managed Care - PPO | Admitting: Radiology

## 2021-08-11 ENCOUNTER — Encounter (HOSPITAL_BASED_OUTPATIENT_CLINIC_OR_DEPARTMENT_OTHER): Payer: Self-pay | Admitting: *Deleted

## 2021-08-11 ENCOUNTER — Emergency Department (HOSPITAL_BASED_OUTPATIENT_CLINIC_OR_DEPARTMENT_OTHER)
Admission: EM | Admit: 2021-08-11 | Discharge: 2021-08-11 | Disposition: A | Discharge status: Home / Self Care | Payer: Commercial Managed Care - PPO | Attending: Emergency Medicine | Admitting: Emergency Medicine

## 2021-08-11 ENCOUNTER — Ambulatory Visit: Payer: Commercial Managed Care - PPO | Admitting: Nurse Practitioner

## 2021-08-11 DIAGNOSIS — I10 Essential (primary) hypertension: Secondary | ICD-10-CM | POA: Insufficient documentation

## 2021-08-11 DIAGNOSIS — E1165 Type 2 diabetes mellitus with hyperglycemia: Secondary | ICD-10-CM | POA: Diagnosis not present

## 2021-08-11 DIAGNOSIS — R030 Elevated blood-pressure reading, without diagnosis of hypertension: Secondary | ICD-10-CM | POA: Diagnosis not present

## 2021-08-11 DIAGNOSIS — M10071 Idiopathic gout, right ankle and foot: Secondary | ICD-10-CM | POA: Diagnosis not present

## 2021-08-11 DIAGNOSIS — Z7982 Long term (current) use of aspirin: Secondary | ICD-10-CM | POA: Insufficient documentation

## 2021-08-11 DIAGNOSIS — Z79899 Other long term (current) drug therapy: Secondary | ICD-10-CM | POA: Insufficient documentation

## 2021-08-11 DIAGNOSIS — Z7984 Long term (current) use of oral hypoglycemic drugs: Secondary | ICD-10-CM | POA: Insufficient documentation

## 2021-08-11 DIAGNOSIS — M79671 Pain in right foot: Secondary | ICD-10-CM | POA: Diagnosis present

## 2021-08-11 LAB — CBG MONITORING, ED: Glucose-Capillary: 145 mg/dL — ABNORMAL HIGH (ref 70–99)

## 2021-08-11 MED ORDER — PREDNISONE 50 MG PO TABS: 50.0000 mg | ORAL_TABLET | Freq: Every day | ORAL | 0 refills | Status: DC

## 2021-08-11 MED ORDER — ONDANSETRON HCL 4 MG PO TABS: 4.0000 mg | ORAL_TABLET | Freq: Four times a day (QID) | ORAL | 0 refills | Status: DC | PRN

## 2021-08-11 MED ORDER — PREDNISONE 50 MG PO TABS
60.0000 mg | ORAL_TABLET | Freq: Once | ORAL | Status: AC
  Administered 2021-08-11: 60 mg via ORAL
  Filled 2021-08-11: qty 1

## 2021-08-11 MED ORDER — OXYCODONE-ACETAMINOPHEN 5-325 MG PO TABS
1.0000 | ORAL_TABLET | Freq: Four times a day (QID) | ORAL | 0 refills | Status: DC | PRN
Start: 2021-08-11 — End: 2021-10-23

## 2021-08-11 NOTE — ED Notes (Signed)
Pt verbalizes understanding of discharge instructions. Opportunity for questioning and answers were provided. Pt discharged from ED to home.   ? ?

## 2021-08-11 NOTE — ED Triage Notes (Signed)
Pt states his right foot has been hurting for several days, worse today. Unsure of injury. Reports hx of gout. Taking ibuprofen without relief.  ?

## 2021-08-11 NOTE — ED Provider Notes (Signed)
?[facility] DEPT ?Provider Note ? ? ?CSN: [id number] ?Arrival date & time: 08/11/21  0054 ? ?  ? ?History ? ?Chief Complaint  ?Patient presents with  ? Foot Pain  ? ? ?Paul Warner is a 61 y.o. male. ? ?The history is provided by the patient.  ?Foot Pain ?He has history of hypertension, diabetes, hyperlipidemia, gout, chronic kidney disease and comes in complaining of pain in his right foot over the last 2 days which has been getting progressively worse.  He denies any trauma.  He has taken ibuprofen without any relief.  He states that when he had gout in the past, it was in his toe and not his foot, but otherwise it feels similar. ?  ?Home Medications ?Prior to Admission medications   ?Medication Sig Start Date End Date Taking? Authorizing Provider  ?aspirin 81 MG tablet Take 81 mg by mouth daily. Reported on 09/23/2015    [provider]  ?benzonatate (TESSALON PERLES) 100 MG capsule Take 2 capsules (200 mg total) by mouth 3 (three) times daily as needed for cough (Max: '600mg'$  per day). ?Patient not taking: Reported on 07/07/2021 05/03/21   Magda Bernheim, NP  ?Blood Glucose Monitoring Suppl (FREESTYLE LITE) DEVI 1 Device by Does not apply route 3 (three) times daily. 09/05/18   [name], NP  ?Cholecalciferol (VITAMIN D PO) Take 10,000 Units by mouth daily.    [provider]  ?glipiZIDE (GLUCOTROL) 5 MG tablet TAKE 1 TABLET 3 TIMES A DAY WITH MEALS FOR DIABETES 05/06/20   [name], MD  ?glucose blood (FREESTYLE LITE) test strip Check blood sugar 1 to 2 times daily. DX-E11.9 08/17/20   [name], MD  ?Lancets (FREESTYLE) lancets Use as instructed 09/05/18   [name], NP  ?meloxicam (MOBIC) 7.5 MG tablet TAKE 1 TABLET (7.5 MG TOTAL) BY MOUTH DAILY FOR TWO WEEKS, THEN AS NEEDED. 05/11/20   Garnet Sierras, NP  ?metFORMIN (GLUCOPHAGE-XR) 500 MG 24 hr tablet TAKE 2 TABLETS 2 TIMES A DAY WITH MEALS FOR DIABETES 05/15/21   [name], NP  ?Multiple  Vitamins-Minerals (MULTIVITAMIN WITH MINERALS) tablet Take 1 tablet by mouth daily.    [provider]  ?olmesartan-hydrochlorothiazide (BENICAR HCT) 20-12.5 MG tablet TAKE 1 TABLET DAILY FOR BLOOD PRESSURE 06/01/21   [name], NP  ?Omega-3 Fatty Acids (FISH OIL OMEGA-3 PO) Take 1 capsule by mouth daily.    [provider]  ?rosuvastatin (CRESTOR) 10 MG tablet Take  1 tablet  Daily  for Cholesterol ?Patient not taking: Reported on 07/07/2021 05/06/21   [name], NP  ?tirzepatide [facility]) 12.5 MG/0.5ML Pen Inject 12.5 mg into the skin once a week. 07/07/21   Magda Bernheim, NP  ?Zinc 50 MG TABS Take 1 tablet Daily 12/14/18   [name], MD  ?   ? ?Allergies    ?Codeine and Nsaids   ? ?Review of Systems   ?Review of Systems  ?All other systems reviewed and are negative. ? ?Physical Exam ?Updated Vital Signs ?BP (!) 185/101 (BP Location: Right Arm)   Pulse 79   Temp 97.9 ?F (36.6 ?C)   Resp 18   SpO2 100%  ?Physical Exam ?Vitals and nursing note reviewed.  ?61 year old male, resting comfortably and in no acute distress. Vital signs are significant for elevated blood pressure. Oxygen saturation is 100%, which is normal. ?Head is normocephalic and atraumatic. PERRLA, EOMI. Oropharynx is clear. ?Neck is nontender and supple without adenopathy or JVD. ?Back is nontender  and there is no CVA tenderness. ?Lungs are clear without rales, wheezes, or rhonchi. ?Chest is nontender. ?Heart has regular rate and rhythm without murmur. ?Abdomen is soft, flat, nontender. ?Extremities: Right foot has mild swelling and is warm to the touch with marked tenderness to palpation over the dorsal aspect of the foot medially.  There is also tenderness palpation over the right first MTP joint.  Overall picture is consistent with gout. ?Skin is warm and dry without rash. ?Neurologic: Mental status is normal, cranial nerves are intact, moves all extremities equally. ? ?ED Results / Procedures / Treatments    ?Labs ?(all labs ordered are listed, but only abnormal results are displayed) ?Labs Reviewed  ?CBG MONITORING, ED  ? ? ?EKG ?None ? ?Radiology ?DG Foot Complete Right ? ?Result Date: 08/11/2021 ?CLINICAL DATA:  Right foot pain. EXAM: RIGHT FOOT COMPLETE - 3+ VIEW COMPARISON:  None. FINDINGS: No acute fracture or dislocation. The bones are well mineralized. No significant arthritic changes. The soft tissues are unremarkable. IMPRESSION: Negative. Electronically Signed   By: Anner Crete M.D.   On: 08/11/2021 01:25   ? ?Procedures ?Procedures  ? ? ?Medications Ordered in ED ?Medications  ?predniSONE (DELTASONE) tablet 60 mg (has no administration in time range)  ? ? ?ED Course/ Medical Decision Making/ A&P ?  ?                        ?Medical Decision Making ?Amount and/or Complexity of Data Reviewed ?Radiology: ordered. ? ?Risk ?Prescription drug management. ? ? ?Right foot pain which seems to be gout.  He does have history of diabetes, will need to check CBG.  Anticipate treatment with prednisone and oxycodone-acetaminophen.  Old records are reviewed, and he has no relevant past visits.  He did have a documented uric acid of 8.5 on 12/25/2018, and as high as 9.4 on 08/17/2017.  This confirms history of gout ? ?Glucose is 145.  He is discharged with prescriptions for prednisone and oxycodone-acetaminophen as noted above.  He does have history of nausea and vomiting from codeine, will also give prescription for ondansetron.  Follow-up with PCP.  Encouraged to monitor his blood pressure as an outpatient.  He does relate history of whitecoat hypertension. ? ? ? ? ? ? ? ?Final Clinical Impression(s) / ED Diagnoses ?Final diagnoses:  ?Acute idiopathic gout of right foot  ?Elevated blood pressure reading with diagnosis of hypertension  ? ? ?Rx / DC Orders ?ED Discharge Orders   ? ?      Ordered  ?  predniSONE (DELTASONE) 50 MG tablet  Daily       ? 08/11/21 0502  ?  oxyCODONE-acetaminophen (PERCOCET/ROXICET) 5-325 MG  tablet  Every 6 hours PRN       ? 08/11/21 0502  ?  ondansetron (ZOFRAN) 4 MG tablet  Every 6 hours PRN       ? 08/11/21 0502  ? ?  ?  ? ?  ? ? ?  ?Delora Fuel, MD ?55/37/48 [phone] ? ?

## 2021-08-11 NOTE — Discharge Instructions (Addendum)
Please take your please check your blood pressure once a day and keep a record of it.  Take that record with you when you see your primary care provider. ?

## 2021-08-19 ENCOUNTER — Encounter: Payer: Commercial Managed Care - PPO | Admitting: Internal Medicine

## 2021-08-31 LAB — HM DIABETES EYE EXAM

## 2021-09-06 ENCOUNTER — Encounter: Payer: Self-pay | Admitting: Internal Medicine

## 2021-10-21 ENCOUNTER — Observation Stay (HOSPITAL_BASED_OUTPATIENT_CLINIC_OR_DEPARTMENT_OTHER)
Admission: EM | Admit: 2021-10-21 | Discharge: 2021-10-23 | Disposition: A | Discharge status: Home / Self Care | Payer: Commercial Managed Care - PPO | Attending: Family Medicine | Admitting: Family Medicine

## 2021-10-21 ENCOUNTER — Other Ambulatory Visit: Payer: Self-pay

## 2021-10-21 ENCOUNTER — Emergency Department (HOSPITAL_BASED_OUTPATIENT_CLINIC_OR_DEPARTMENT_OTHER): Payer: Commercial Managed Care - PPO

## 2021-10-21 ENCOUNTER — Encounter (HOSPITAL_BASED_OUTPATIENT_CLINIC_OR_DEPARTMENT_OTHER): Payer: Self-pay | Admitting: Emergency Medicine

## 2021-10-21 ENCOUNTER — Observation Stay (HOSPITAL_BASED_OUTPATIENT_CLINIC_OR_DEPARTMENT_OTHER): Payer: Commercial Managed Care - PPO

## 2021-10-21 DIAGNOSIS — E1159 Type 2 diabetes mellitus with other circulatory complications: Secondary | ICD-10-CM | POA: Diagnosis present

## 2021-10-21 DIAGNOSIS — I152 Hypertension secondary to endocrine disorders: Secondary | ICD-10-CM | POA: Diagnosis present

## 2021-10-21 DIAGNOSIS — R197 Diarrhea, unspecified: Secondary | ICD-10-CM | POA: Diagnosis not present

## 2021-10-21 DIAGNOSIS — Z7984 Long term (current) use of oral hypoglycemic drugs: Secondary | ICD-10-CM | POA: Diagnosis not present

## 2021-10-21 DIAGNOSIS — Z7982 Long term (current) use of aspirin: Secondary | ICD-10-CM | POA: Diagnosis not present

## 2021-10-21 DIAGNOSIS — R000 Tachycardia, unspecified: Secondary | ICD-10-CM | POA: Insufficient documentation

## 2021-10-21 DIAGNOSIS — E1169 Type 2 diabetes mellitus with other specified complication: Secondary | ICD-10-CM | POA: Diagnosis not present

## 2021-10-21 DIAGNOSIS — N179 Acute kidney failure, unspecified: Secondary | ICD-10-CM

## 2021-10-21 DIAGNOSIS — M109 Gout, unspecified: Secondary | ICD-10-CM | POA: Diagnosis not present

## 2021-10-21 DIAGNOSIS — Z79899 Other long term (current) drug therapy: Secondary | ICD-10-CM | POA: Diagnosis not present

## 2021-10-21 DIAGNOSIS — E785 Hyperlipidemia, unspecified: Secondary | ICD-10-CM | POA: Diagnosis not present

## 2021-10-21 DIAGNOSIS — E119 Type 2 diabetes mellitus without complications: Secondary | ICD-10-CM

## 2021-10-21 DIAGNOSIS — R55 Syncope and collapse: Secondary | ICD-10-CM | POA: Diagnosis present

## 2021-10-21 DIAGNOSIS — I10 Essential (primary) hypertension: Secondary | ICD-10-CM | POA: Insufficient documentation

## 2021-10-21 DIAGNOSIS — R0789 Other chest pain: Secondary | ICD-10-CM | POA: Insufficient documentation

## 2021-10-21 DIAGNOSIS — Z20822 Contact with and (suspected) exposure to covid-19: Secondary | ICD-10-CM | POA: Insufficient documentation

## 2021-10-21 DIAGNOSIS — I4510 Unspecified right bundle-branch block: Secondary | ICD-10-CM | POA: Diagnosis not present

## 2021-10-21 LAB — GLUCOSE, CAPILLARY
Glucose-Capillary: 151 mg/dL — ABNORMAL HIGH (ref 70–99)
Glucose-Capillary: 169 mg/dL — ABNORMAL HIGH (ref 70–99)

## 2021-10-21 LAB — URINALYSIS, ROUTINE W REFLEX MICROSCOPIC
Bilirubin Urine: NEGATIVE
Glucose, UA: 250 mg/dL — AB
Ketones, ur: NEGATIVE mg/dL
Leukocytes,Ua: NEGATIVE
Nitrite: NEGATIVE
Protein, ur: NEGATIVE mg/dL
Specific Gravity, Urine: <=1.005 (ref 1.005–1.030)
pH: 5.5 (ref 5.0–8.0)

## 2021-10-21 LAB — ECHOCARDIOGRAM COMPLETE
AR max vel: 3.87 cm2
AV Area VTI: 3.8 cm2
AV Area mean vel: 3.74 cm2
AV Mean grad: 5 mmHg
AV Peak grad: 8.4 mmHg
Ao pk vel: 1.45 m/s
Area-P 1/2: 4.17 cm2
Calc EF: 66.6 %
Height: 72 in
S' Lateral: 2.95 cm
Single Plane A2C EF: 65.4 %
Single Plane A4C EF: 67.4 %
Weight: 4000 oz

## 2021-10-21 LAB — CBC WITH DIFFERENTIAL/PLATELET
Abs Immature Granulocytes: 0.02 10*3/uL (ref 0.00–0.07)
Basophils Absolute: 0.1 10*3/uL (ref 0.0–0.1)
Basophils Relative: 1 %
Eosinophils Absolute: 0.4 10*3/uL (ref 0.0–0.5)
Eosinophils Relative: 3 %
HCT: 44.4 % (ref 39.0–52.0)
Hemoglobin: 16.1 g/dL (ref 13.0–17.0)
Immature Granulocytes: 0 %
Lymphocytes Relative: 40 %
Lymphs Abs: 4.4 10*3/uL — ABNORMAL HIGH (ref 0.7–4.0)
MCH: 32.2 pg (ref 26.0–34.0)
MCHC: 36.3 g/dL — ABNORMAL HIGH (ref 30.0–36.0)
MCV: 88.8 fL (ref 80.0–100.0)
Monocytes Absolute: 0.9 10*3/uL (ref 0.1–1.0)
Monocytes Relative: 8 %
Neutro Abs: 5.3 10*3/uL (ref 1.7–7.7)
Neutrophils Relative %: 48 %
Platelets: 239 10*3/uL (ref 150–400)
RBC: 5 MIL/uL (ref 4.22–5.81)
RDW: 12.2 % (ref 11.5–15.5)
WBC: 11.1 10*3/uL — ABNORMAL HIGH (ref 4.0–10.5)
nRBC: 0 % (ref 0.0–0.2)

## 2021-10-21 LAB — COMPREHENSIVE METABOLIC PANEL
ALT: 60 U/L — ABNORMAL HIGH (ref 0–44)
AST: 39 U/L (ref 15–41)
Albumin: 4.2 g/dL (ref 3.5–5.0)
Alkaline Phosphatase: 50 U/L (ref 38–126)
Anion gap: 9 (ref 5–15)
BUN: 17 mg/dL (ref 6–20)
CO2: 28 mmol/L (ref 22–32)
Calcium: 9.4 mg/dL (ref 8.9–10.3)
Chloride: 100 mmol/L (ref 98–111)
Creatinine, Ser: 1.36 mg/dL — ABNORMAL HIGH (ref 0.61–1.24)
GFR, Estimated: 60 mL/min — ABNORMAL LOW (ref >60)
Glucose, Bld: 223 mg/dL — ABNORMAL HIGH (ref 70–99)
Potassium: 3.6 mmol/L (ref 3.5–5.1)
Sodium: 137 mmol/L (ref 135–145)
Total Bilirubin: 1 mg/dL (ref 0.3–1.2)
Total Protein: 7.8 g/dL (ref 6.5–8.1)

## 2021-10-21 LAB — CBG MONITORING, ED: Glucose-Capillary: 175 mg/dL — ABNORMAL HIGH (ref 70–99)

## 2021-10-21 LAB — RAPID URINE DRUG SCREEN, HOSP PERFORMED
Amphetamines: NOT DETECTED
Barbiturates: NOT DETECTED
Benzodiazepines: NOT DETECTED
Cocaine: NOT DETECTED
Opiates: NOT DETECTED
Tetrahydrocannabinol: NOT DETECTED

## 2021-10-21 LAB — URINALYSIS, MICROSCOPIC (REFLEX)

## 2021-10-21 LAB — HEMOGLOBIN A1C
Hgb A1c MFr Bld: 9 % — ABNORMAL HIGH (ref 4.8–5.6)
Mean Plasma Glucose: 211.6 mg/dL

## 2021-10-21 LAB — TROPONIN I (HIGH SENSITIVITY)
Troponin I (High Sensitivity): 3 ng/L (ref <18)
Troponin I (High Sensitivity): 4 ng/L (ref <18)

## 2021-10-21 LAB — RESP PANEL BY RT-PCR (FLU A&B, COVID) ARPGX2
Influenza A by PCR: NEGATIVE
Influenza B by PCR: NEGATIVE
SARS Coronavirus 2 by RT PCR: NEGATIVE

## 2021-10-21 MED ORDER — ACETAMINOPHEN 650 MG RE SUPP: 650.0000 mg | Freq: Four times a day (QID) | RECTAL | Status: DC | PRN

## 2021-10-21 MED ORDER — KETOROLAC TROMETHAMINE 30 MG/ML IJ SOLN
30.0000 mg | Freq: Once | INTRAMUSCULAR | Status: AC
Start: 2021-10-21 — End: 2021-10-21
  Administered 2021-10-21: 30 mg via INTRAVENOUS
  Filled 2021-10-21: qty 1

## 2021-10-21 MED ORDER — SODIUM CHLORIDE 0.9 % IV SOLN
INTRAVENOUS | Status: DC
Start: 2021-10-21 — End: 2021-10-23

## 2021-10-21 MED ORDER — ONDANSETRON HCL 4 MG/2ML IJ SOLN: 4.0000 mg | Freq: Four times a day (QID) | INTRAMUSCULAR | Status: DC | PRN

## 2021-10-21 MED ORDER — IOHEXOL 350 MG/ML SOLN
125.0000 mL | Freq: Once | INTRAVENOUS | Status: AC | PRN
  Administered 2021-10-21: 125 mL via INTRAVENOUS

## 2021-10-21 MED ORDER — SODIUM CHLORIDE 0.9% FLUSH
3.0000 mL | Freq: Two times a day (BID) | INTRAVENOUS | Status: DC
  Administered 2021-10-21 (×2): 3 mL via INTRAVENOUS

## 2021-10-21 MED ORDER — GUAIFENESIN ER 600 MG PO TB12
600.0000 mg | ORAL_TABLET | Freq: Two times a day (BID) | ORAL | Status: DC
  Administered 2021-10-21 – 2021-10-22 (×4): 600 mg via ORAL
  Filled 2021-10-21 (×4): qty 1

## 2021-10-21 MED ORDER — INSULIN ASPART 100 UNIT/ML IJ SOLN
0.0000 [IU] | Freq: Three times a day (TID) | INTRAMUSCULAR | Status: DC
  Administered 2021-10-21: 3 [IU] via SUBCUTANEOUS
  Administered 2021-10-22: 5 [IU] via SUBCUTANEOUS
  Administered 2021-10-22 – 2021-10-23 (×3): 3 [IU] via SUBCUTANEOUS

## 2021-10-21 MED ORDER — PANTOPRAZOLE SODIUM 40 MG PO TBEC
40.0000 mg | DELAYED_RELEASE_TABLET | Freq: Every day | ORAL | Status: DC
  Administered 2021-10-21 – 2021-10-22 (×2): 40 mg via ORAL
  Filled 2021-10-21 (×2): qty 1

## 2021-10-21 MED ORDER — ONDANSETRON HCL 4 MG/2ML IJ SOLN
4.0000 mg | Freq: Once | INTRAMUSCULAR | Status: AC
  Administered 2021-10-21: 4 mg via INTRAVENOUS
  Filled 2021-10-21: qty 2

## 2021-10-21 MED ORDER — SODIUM CHLORIDE 0.9 % IV BOLUS
500.0000 mL | Freq: Once | INTRAVENOUS | Status: AC
Start: 2021-10-21 — End: 2021-10-21
  Administered 2021-10-21: 500 mL via INTRAVENOUS

## 2021-10-21 MED ORDER — SODIUM CHLORIDE 0.9 % IV BOLUS
1000.0000 mL | Freq: Once | INTRAVENOUS | Status: AC
  Administered 2021-10-21: 1000 mL via INTRAVENOUS

## 2021-10-21 MED ORDER — INSULIN ASPART 100 UNIT/ML IJ SOLN
0.0000 [IU] | Freq: Every day | INTRAMUSCULAR | Status: DC
  Administered 2021-10-22: 2 [IU] via SUBCUTANEOUS

## 2021-10-21 MED ORDER — PERFLUTREN LIPID MICROSPHERE
1.0000 mL | INTRAVENOUS | Status: AC | PRN
  Administered 2021-10-21: 2 mL via INTRAVENOUS

## 2021-10-21 MED ORDER — ACETAMINOPHEN 325 MG PO TABS: 650.0000 mg | ORAL_TABLET | Freq: Four times a day (QID) | ORAL | Status: DC | PRN

## 2021-10-21 MED ORDER — ONDANSETRON HCL 4 MG PO TABS: 4.0000 mg | ORAL_TABLET | Freq: Four times a day (QID) | ORAL | Status: DC | PRN

## 2021-10-21 NOTE — ED Triage Notes (Signed)
Per pt family, pt was using bathroom having diarrhea and vomiting and thinks he passed out. X 30 min. Pt states had been fine today then just started feeling dizzy. My have aspirated on vomit.

## 2021-10-21 NOTE — ED Provider Notes (Signed)
Paul Warner Provider Note   CSN: [id number] Arrival date & time: 10/21/21  0000     History  Chief Complaint  Patient presents with   Loss of Consciousness    Paul Warner is a 61 y.o. male.  The history is provided by the patient.  Loss of Consciousness Episode history:  Single Most recent episode:  Today Duration: minutes. Timing:  Constant Progression:  Resolved Chronicity:  New Context: bowel movement   Context comment:  Diarrhea Relieved by:  Nothing Worsened by:  Nothing Ineffective treatments:  None tried Associated symptoms: chest pain, nausea and vomiting   Associated symptoms: no confusion, no fever, no palpitations, no shortness of breath and no visual change   Risk factors: no congenital heart disease       Home Medications Prior to Admission medications   Medication Sig Start Date End Date Taking? Authorizing Provider  aspirin 81 MG tablet Take 81 mg by mouth daily. Reported on 09/23/2015    [provider]  Blood Glucose Monitoring Suppl (FREESTYLE LITE) DEVI 1 Device by Does not apply route 3 (three) times daily. 09/05/18   [name], NP  Cholecalciferol (VITAMIN D PO) Take 10,000 Units by mouth daily.    [provider]  glipiZIDE (GLUCOTROL) 5 MG tablet TAKE 1 TABLET 3 TIMES A DAY WITH MEALS FOR DIABETES 05/06/20   [name], MD  glucose blood (FREESTYLE LITE) test strip Check blood sugar 1 to 2 times daily. [id number] 08/17/20   [name], MD  Lancets (FREESTYLE) lancets Use as instructed 09/05/18   [name], NP  meloxicam (MOBIC) 7.5 MG tablet TAKE 1 TABLET (7.5 MG TOTAL) BY MOUTH DAILY FOR TWO WEEKS, THEN AS NEEDED. 05/11/20   Garnet Sierras, NP  metFORMIN (GLUCOPHAGE-XR) 500 MG 24 hr tablet TAKE 2 TABLETS 2 TIMES A DAY WITH MEALS FOR DIABETES 05/15/21   [name], NP  Multiple Vitamins-Minerals (MULTIVITAMIN WITH MINERALS) tablet Take 1 tablet by mouth daily.    [provider]  olmesartan-hydrochlorothiazide (BENICAR HCT) 20-12.5 MG tablet TAKE 1 TABLET DAILY FOR BLOOD PRESSURE 06/01/21   [name], NP  Omega-3 Fatty Acids (FISH OIL OMEGA-3 PO) Take 1 capsule by mouth daily.    [provider]  ondansetron (ZOFRAN) 4 MG tablet Take 1 tablet (4 mg total) by mouth every 6 (six) hours as needed for nausea. 6/72/09   Delora Fuel, MD  oxyCODONE-acetaminophen (PERCOCET/ROXICET) 5-325 MG tablet Take 1 tablet by mouth every 6 (six) hours as needed for severe pain. 4/70/96   Delora Fuel, MD  predniSONE (DELTASONE) 50 MG tablet Take 1 tablet (50 mg total) by mouth daily. 2/83/66   Delora Fuel, MD  rosuvastatin (CRESTOR) 10 MG tablet Take  1 tablet  Daily  for Cholesterol Patient not taking: Reported on 07/07/2021 05/06/21   [name], NP  tirzepatide [facility]) 12.5 MG/0.5ML Pen Inject 12.5 mg into the skin once a week. 07/07/21   Magda Bernheim, NP  Zinc 50 MG TABS Take 1 tablet Daily 12/14/18   [name], MD      Allergies    Codeine and Nsaids    Review of Systems   Review of Systems  Constitutional:  Negative for fever.  HENT:  Negative for facial swelling.   Eyes:  Negative for redness.  Respiratory:  Negative for shortness of breath.   Cardiovascular:  Positive for chest pain and syncope. Negative for palpitations and leg swelling.  Gastrointestinal:  Positive for diarrhea,  nausea and vomiting.  Musculoskeletal:  Negative for neck stiffness.  Psychiatric/Behavioral:  Negative for confusion.   All other systems reviewed and are negative.  Physical Exam Updated Vital Signs BP 113/80   Pulse (!) 105   Temp 97.6 F (36.4 C)   Resp (!) 30   Ht 6' (1.829 m)   Wt 113.4 kg   SpO2 96%   BMI 33.91 kg/m  Physical Exam Vitals and nursing note reviewed. Exam conducted with a chaperone present.  Constitutional:      Appearance: Normal appearance. He is not diaphoretic.  HENT:     Head: Normocephalic and atraumatic.     Nose:  Nose normal.     Mouth/Throat:     Mouth: Mucous membranes are moist.  Eyes:     Conjunctiva/sclera: Conjunctivae normal.     Pupils: Pupils are equal, round, and reactive to light.  Cardiovascular:     Rate and Rhythm: Normal rate and regular rhythm.     Pulses: Normal pulses.     Heart sounds: Normal heart sounds.  Pulmonary:     Effort: Pulmonary effort is normal.     Breath sounds: Normal breath sounds.  Abdominal:     General: Abdomen is flat. Bowel sounds are normal.     Palpations: Abdomen is soft.     Tenderness: There is no abdominal tenderness. There is no guarding or rebound.  Musculoskeletal:        General: Normal range of motion.  Skin:    General: Skin is warm and dry.     Capillary Refill: Capillary refill takes less than 2 seconds.  Neurological:     General: No focal deficit present.     Mental Status: He is alert and oriented to person, place, and time.     Deep Tendon Reflexes: Reflexes normal.  Psychiatric:        Mood and Affect: Mood normal.        Behavior: Behavior normal.    ED Results / Procedures / Treatments   Labs (all labs ordered are listed, but only abnormal results are displayed) Results for orders placed or performed during the hospital encounter of 10/21/21  Resp Panel by RT-PCR (Flu A&B, Covid) Nasopharyngeal Swab   Specimen: Nasopharyngeal Swab; Nasopharyngeal(NP) swabs in vial transport medium  Result Value Ref Range   SARS Coronavirus 2 by RT PCR NEGATIVE NEGATIVE   Influenza A by PCR NEGATIVE NEGATIVE   Influenza B by PCR NEGATIVE NEGATIVE  CBC with Differential  Result Value Ref Range   WBC 11.1 (H) 4.0 - 10.5 K/uL   RBC 5.00 4.22 - 5.81 MIL/uL   Hemoglobin 16.1 13.0 - 17.0 g/dL   HCT 44.4 39.0 - 52.0 %   MCV 88.8 80.0 - 100.0 fL   MCH 32.2 26.0 - 34.0 pg   MCHC 36.3 (H) 30.0 - 36.0 g/dL   RDW 12.2 11.5 - 15.5 %   Platelets 239 150 - 400 K/uL   nRBC 0.0 0.0 - 0.2 %   Neutrophils Relative % 48 %   Neutro Abs 5.3 1.7 - 7.7  K/uL   Lymphocytes Relative 40 %   Lymphs Abs 4.4 (H) 0.7 - 4.0 K/uL   Monocytes Relative 8 %   Monocytes Absolute 0.9 0.1 - 1.0 K/uL   Eosinophils Relative 3 %   Eosinophils Absolute 0.4 0.0 - 0.5 K/uL   Basophils Relative 1 %   Basophils Absolute 0.1 0.0 - 0.1 K/uL   Immature Granulocytes 0 %  Abs Immature Granulocytes 0.02 0.00 - 0.07 K/uL  Comprehensive metabolic panel  Result Value Ref Range   Sodium 137 135 - 145 mmol/L   Potassium 3.6 3.5 - 5.1 mmol/L   Chloride 100 98 - 111 mmol/L   CO2 28 22 - 32 mmol/L   Glucose, Bld 223 (H) 70 - 99 mg/dL   BUN 17 6 - 20 mg/dL   Creatinine, Ser 1.36 (H) 0.61 - 1.24 mg/dL   Calcium 9.4 8.9 - 10.3 mg/dL   Total Protein 7.8 6.5 - 8.1 g/dL   Albumin 4.2 3.5 - 5.0 g/dL   AST 39 15 - 41 U/L   ALT 60 (H) 0 - 44 U/L   Alkaline Phosphatase 50 38 - 126 U/L   Total Bilirubin 1.0 0.3 - 1.2 mg/dL   GFR, Estimated 60 (L) >60 mL/min   Anion gap 9 5 - 15  Urinalysis, Routine w reflex microscopic Urine, Clean Catch  Result Value Ref Range   Color, Urine YELLOW YELLOW   APPearance CLEAR CLEAR   Specific Gravity, Urine <=1.005 1.005 - 1.030   pH 5.5 5.0 - 8.0   Glucose, UA 250 (A) NEGATIVE mg/dL   Hgb urine dipstick TRACE (A) NEGATIVE   Bilirubin Urine NEGATIVE NEGATIVE   Ketones, ur NEGATIVE NEGATIVE mg/dL   Protein, ur NEGATIVE NEGATIVE mg/dL   Nitrite NEGATIVE NEGATIVE   Leukocytes,Ua NEGATIVE NEGATIVE  Urinalysis, Microscopic (reflex)  Result Value Ref Range   RBC / HPF 0-5 0 - 5 RBC/hpf   WBC, UA 0-5 0 - 5 WBC/hpf   Bacteria, UA RARE (A) NONE SEEN   Squamous Epithelial / LPF 0-5 0 - 5   Mucus PRESENT   Troponin I (High Sensitivity)  Result Value Ref Range   Troponin I (High Sensitivity) 3 <18 ng/L  Troponin I (High Sensitivity)  Result Value Ref Range   Troponin I (High Sensitivity) 4 <18 ng/L   CT Angio Chest/Abd/Pel for Dissection W and/or Wo Contrast  Result Date: 10/21/2021 CLINICAL DATA:  Syncopal episode. EXAM: CT  ANGIOGRAPHY CHEST, ABDOMEN AND PELVIS TECHNIQUE: Non-contrast CT of the chest was initially obtained. Multidetector CT imaging through the chest, abdomen and pelvis was performed using the standard protocol during bolus administration of intravenous contrast. Multiplanar reconstructed images and MIPs were obtained and reviewed to evaluate the vascular anatomy. RADIATION DOSE REDUCTION: This exam was performed according to the departmental dose-optimization program which includes automated exposure control, adjustment of the mA and/or kV according to patient size and/or use of iterative reconstruction technique. CONTRAST:  18m OMNIPAQUE IOHEXOL 350 MG/ML SOLN COMPARISON:  None Available. FINDINGS: CTA CHEST FINDINGS Cardiovascular: Satisfactory opacification of the pulmonary arteries to the segmental level. No evidence of pulmonary embolism. Normal heart size. No pericardial effusion. Mediastinum/Nodes: No enlarged mediastinal, hilar, or axillary lymph nodes. Thyroid gland, trachea, and esophagus demonstrate no significant findings. Lungs/Pleura: Lungs are clear. No pleural effusion or pneumothorax. Musculoskeletal: No chest wall abnormality. No acute or significant osseous findings. Review of the MIP images confirms the above findings. CTA ABDOMEN AND PELVIS FINDINGS VASCULAR Aorta: Normal caliber aorta without aneurysm, dissection, vasculitis or significant stenosis. Celiac: Patent without evidence of aneurysm, dissection, vasculitis or significant stenosis. SMA: Patent without evidence of aneurysm, dissection, vasculitis or significant stenosis. Renals: Both renal arteries are patent without evidence of aneurysm, dissection, vasculitis, fibromuscular dysplasia or significant stenosis. IMA: Patent without evidence of aneurysm, dissection, vasculitis or significant stenosis. Inflow: Patent without evidence of aneurysm, dissection, vasculitis or significant stenosis. Veins: No  obvious venous abnormality within the  limitations of this arterial phase study. Review of the MIP images confirms the above findings. NON-VASCULAR Hepatobiliary: There is diffuse fatty infiltration of the liver parenchyma. No focal liver abnormality is seen. No gallstones, gallbladder wall thickening, or biliary dilatation. Pancreas: Unremarkable. No pancreatic ductal dilatation or surrounding inflammatory changes. Spleen: Normal in size without focal abnormality. Adrenals/Urinary Tract: Adrenal glands are unremarkable. Kidneys are normal in size, without obstructing renal calculi or hydronephrosis. A 4.1 cm x 4.0 cm simple cyst is seen within the posteromedial aspect of the mid to upper right kidney. An additional smaller simple cyst is seen within the posterior aspect of the mid to lower right kidney. No additional follow-up imaging is recommended. Multiple subcentimeter nonobstructing renal calculi are seen within the right kidney. Bladder is unremarkable. Stomach/Bowel: Stomach is within normal limits. Appendix appears normal. No evidence of bowel wall thickening, distention, or inflammatory changes. Lymphatic: No abnormal abdominal or pelvic lymph nodes are identified. Reproductive: Prostate is unremarkable. Other: No abdominal wall hernia or abnormality. No abdominopelvic ascites. Musculoskeletal: No acute or significant osseous findings. Review of the MIP images confirms the above findings. IMPRESSION: 1. No evidence of pulmonary embolism or other acute intrathoracic process. 2. Hepatic steatosis. 3. Multiple subcentimeter nonobstructing right renal calculi. Electronically Signed   By: Virgina Norfolk M.D.   On: 10/21/2021 02:01      EKG EKG Interpretation  Date/Time:  Thursday Oct 21 2021 00:13:21 EDT Ventricular Rate:  88 PR Interval:  182 QRS Duration: 150 QT Interval:  392 QTC Calculation: 474 R Axis:   192 Text Interpretation: Normal sinus rhythm Right bundle branch block Confirmed by Randal Buba, Paislea Hatton ([id number]) on 10/21/2021  12:21:54 AM  Radiology CT Angio Chest/Abd/Pel for Dissection W and/or Wo Contrast  Result Date: 10/21/2021 CLINICAL DATA:  Syncopal episode. EXAM: CT ANGIOGRAPHY CHEST, ABDOMEN AND PELVIS TECHNIQUE: Non-contrast CT of the chest was initially obtained. Multidetector CT imaging through the chest, abdomen and pelvis was performed using the standard protocol during bolus administration of intravenous contrast. Multiplanar reconstructed images and MIPs were obtained and reviewed to evaluate the vascular anatomy. RADIATION DOSE REDUCTION: This exam was performed according to the departmental dose-optimization program which includes automated exposure control, adjustment of the mA and/or kV according to patient size and/or use of iterative reconstruction technique. CONTRAST:  183m OMNIPAQUE IOHEXOL 350 MG/ML SOLN COMPARISON:  None Available. FINDINGS: CTA CHEST FINDINGS Cardiovascular: Satisfactory opacification of the pulmonary arteries to the segmental level. No evidence of pulmonary embolism. Normal heart size. No pericardial effusion. Mediastinum/Nodes: No enlarged mediastinal, hilar, or axillary lymph nodes. Thyroid gland, trachea, and esophagus demonstrate no significant findings. Lungs/Pleura: Lungs are clear. No pleural effusion or pneumothorax. Musculoskeletal: No chest wall abnormality. No acute or significant osseous findings. Review of the MIP images confirms the above findings. CTA ABDOMEN AND PELVIS FINDINGS VASCULAR Aorta: Normal caliber aorta without aneurysm, dissection, vasculitis or significant stenosis. Celiac: Patent without evidence of aneurysm, dissection, vasculitis or significant stenosis. SMA: Patent without evidence of aneurysm, dissection, vasculitis or significant stenosis. Renals: Both renal arteries are patent without evidence of aneurysm, dissection, vasculitis, fibromuscular dysplasia or significant stenosis. IMA: Patent without evidence of aneurysm, dissection, vasculitis or  significant stenosis. Inflow: Patent without evidence of aneurysm, dissection, vasculitis or significant stenosis. Veins: No obvious venous abnormality within the limitations of this arterial phase study. Review of the MIP images confirms the above findings. NON-VASCULAR Hepatobiliary: There is diffuse fatty infiltration of the liver parenchyma. No focal liver abnormality is  seen. No gallstones, gallbladder wall thickening, or biliary dilatation. Pancreas: Unremarkable. No pancreatic ductal dilatation or surrounding inflammatory changes. Spleen: Normal in size without focal abnormality. Adrenals/Urinary Tract: Adrenal glands are unremarkable. Kidneys are normal in size, without obstructing renal calculi or hydronephrosis. A 4.1 cm x 4.0 cm simple cyst is seen within the posteromedial aspect of the mid to upper right kidney. An additional smaller simple cyst is seen within the posterior aspect of the mid to lower right kidney. No additional follow-up imaging is recommended. Multiple subcentimeter nonobstructing renal calculi are seen within the right kidney. Bladder is unremarkable. Stomach/Bowel: Stomach is within normal limits. Appendix appears normal. No evidence of bowel wall thickening, distention, or inflammatory changes. Lymphatic: No abnormal abdominal or pelvic lymph nodes are identified. Reproductive: Prostate is unremarkable. Other: No abdominal wall hernia or abnormality. No abdominopelvic ascites. Musculoskeletal: No acute or significant osseous findings. Review of the MIP images confirms the above findings. IMPRESSION: 1. No evidence of pulmonary embolism or other acute intrathoracic process. 2. Hepatic steatosis. 3. Multiple subcentimeter nonobstructing right renal calculi. Electronically Signed   By: Virgina Norfolk M.D.   On: 10/21/2021 02:01    Procedures Procedures    Medications Ordered in ED Medications  sodium chloride 0.9 % bolus 1,000 mL (0 mLs Intravenous Stopped 10/21/21 0136)   ondansetron (ZOFRAN) injection 4 mg (4 mg Intravenous Given 10/21/21 0045)  iohexol (OMNIPAQUE) 350 MG/ML injection 125 mL (125 mLs Intravenous Contrast Given 10/21/21 0142)  ketorolac (TORADOL) 30 MG/ML injection 30 mg (30 mg Intravenous Given 10/21/21 0304)  sodium chloride 0.9 % bolus 500 mL (500 mLs Intravenous New Bag/Given 10/21/21 0419)    ED Course/ Medical Decision Making/ A&P                           Medical Decision Making Syncope post diarrhea and vomiting x 2   Amount and/or Complexity of Data Reviewed Independent Historian: spouse    Details: see above External Data Reviewed: notes.    Details: previous notes Labs: ordered.    Details: all labs reviewed: white count slight elevation at 11.1 hemoglobin 16.1 normal platelets, negative covid and flu.  Normal Sodium 137 and potassium normal BUN, creatinine slight elevation at 1.2.  2 negative troponins. Radiology: ordered and independent interpretation performed.    Details: No dissection by me on CTA ECG/medicine tests: ordered and independent interpretation performed. Decision-making details documented in ED Course. Discussion of management or test interpretation with external provider(s): The patient appears reasonably stabilized for admission considering the current resources, flow, and capabilities available in the ED at this time, and I doubt any other [facility] requiring further screening and/or treatment in the ED prior to admission.   Risk Prescription drug management. Decision regarding hospitalization.    Final Clinical Impression(s) / ED Diagnoses Final diagnoses:  Diarrhea, unspecified type  Vasovagal syncope  Right bundle branch block  Tachycardia   Persistent tachycardia and based on [place] syncope score will need admission       Cordie Buening, MD 10/21/21 [id number]

## 2021-10-21 NOTE — Progress Notes (Signed)
Echocardiogram 2D Echocardiogram has been performed.  Paul Warner 10/21/2021, 3:28 PM

## 2021-10-21 NOTE — ED Notes (Signed)
ED TO INPATIENT HANDOFF REPORT  ED Nurse Name and Phone #: Thomes Dinning, RN  S Name/Age/Gender Paul Warner 61 y.o. male Room/Bed: MH05/MH05  Code Status   Code Status: Prior  Home/SNF/Other Home Patient oriented to: self, place, time, and situation Is this baseline? Yes   Triage Complete: Triage complete  Chief Complaint Syncope [R55]  Triage Note Per pt family, pt was using bathroom having diarrhea and vomiting and thinks he passed out. X 30 min. Pt states had been fine today then just started feeling dizzy. My have aspirated on vomit.    Allergies Allergies  Allergen Reactions   Codeine Nausea And Vomiting   Nsaids Other (See Comments)    Bleeding ulcer 2013    Level of Care/Admitting Diagnosis ED Disposition     ED Disposition  Admit   Condition  --   Comment  Hospital Area: [place] [[id number]]  Level of Care: Telemetry [5]  Admit to tele based on following criteria: Eval of Syncope  Interfacility transfer: Yes  May place patient in observation at [facility] or [facility] if equivalent level of care is available:: No  Covid Evaluation: Asymptomatic - no recent exposure (last 10 days) testing not required  Diagnosis: Syncope [206001]  Admitting Physician: Rise Patience [phone]  Attending Physician: Beryle Flock          B Medical/Surgery History Past Medical History:  Diagnosis Date   Allergy    seasonal   Arthritis    Blood transfusion 09/2011   2 units   Diabetes mellitus without complication ([place])    History of adenomatous polyp of colon 10/07/2019   Due 10/03/2022 per Dr. Henrene Pastor   Hyperlipidemia    Hypertension    Renal disorder    Stomach ulcer    Past Surgical History:  Procedure Laterality Date   COLONOSCOPY     ESOPHAGOGASTRODUODENOSCOPY  10/26/2011   Procedure: ESOPHAGOGASTRODUODENOSCOPY (EGD);  Surgeon: Irene Shipper, MD;  Location[facility] END[facility]COPY;  Service: Endoscopy;  Laterality: N/A;    FRACTURE SURGERY  broken collar bone   POLYPECTOMY     UPPER GASTROINTESTINAL ENDOSCOPY       A IV Location/Drains/Wounds Patient Lines/Drains/Airways Status     Active Line/Drains/Airways     Name Placement date Placement time Site Days   Peripheral IV 10/21/21 20 G 1" Anterior;Left;Upper Arm 10/21/21  0022  Arm  less than 1            Intake/Output Last 24 hours No intake or output data in the 24 hours ending 10/21/21 1106  Labs/Imaging Results for orders placed or performed during the hospital encounter of 10/21/21 (from the past 48 hour(s))  CBC with Differential     Status: Abnormal   Collection Time: 10/21/21 12:52 AM  Result Value Ref Range   WBC 11.1 (H) 4.0 - 10.5 K/uL   RBC 5.00 4.22 - 5.81 MIL/uL   Hemoglobin 16.1 13.0 - 17.0 g/dL   HCT 44.4 39.0 - 52.0 %   MCV 88.8 80.0 - 100.0 fL   MCH 32.2 26.0 - 34.0 pg   MCHC 36.3 (H) 30.0 - 36.0 g/dL   RDW 12.2 11.5 - 15.5 %   Platelets 239 150 - 400 K/uL   nRBC 0.0 0.0 - 0.2 %   Neutrophils Relative % 48 %   Neutro Abs 5.3 1.7 - 7.7 K/uL   Lymphocytes Relative 40 %   Lymphs Abs 4.4 (H) 0.7 - 4.0 K/uL  Monocytes Relative 8 %   Monocytes Absolute 0.9 0.1 - 1.0 K/uL   Eosinophils Relative 3 %   Eosinophils Absolute 0.4 0.0 - 0.5 K/uL   Basophils Relative 1 %   Basophils Absolute 0.1 0.0 - 0.1 K/uL   Immature Granulocytes 0 %   Abs Immature Granulocytes 0.02 0.00 - 0.07 K/uL    Comment: Performed at [facility], [address] [place], [place] [address]  Comprehensive metabolic panel     Status: Abnormal   Collection Time: 10/21/21 12:52 AM  Result Value Ref Range   Sodium 137 135 - 145 mmol/L   Potassium 3.6 3.5 - 5.1 mmol/L   Chloride 100 98 - 111 mmol/L   CO2 28 22 - 32 mmol/L   Glucose, Bld 223 (H) 70 - 99 mg/dL    Comment: Glucose reference range applies only to samples taken after fasting for at least 8 hours.   BUN 17 6 - 20 mg/dL   Creatinine, Ser 1.36 (H) 0.61 - 1.24 mg/dL   Calcium  9.4 8.9 - 10.3 mg/dL   Total Protein 7.8 6.5 - 8.1 g/dL   Albumin 4.2 3.5 - 5.0 g/dL   AST 39 15 - 41 U/L   ALT 60 (H) 0 - 44 U/L   Alkaline Phosphatase 50 38 - 126 U/L   Total Bilirubin 1.0 0.3 - 1.2 mg/dL   GFR, Estimated 60 (L) >60 mL/min    Comment: (NOTE) Calculated using the CKD-EPI Creatinine Equation (2021)    Anion gap 9 5 - 15    Comment: Performed at [facility], [address] [place], [place] [address]  Troponin I (High Sensitivity)     Status: None   Collection Time: 10/21/21 12:54 AM  Result Value Ref Range   Troponin I (High Sensitivity) 3 <18 ng/L    Comment: (NOTE) Elevated high sensitivity troponin I (hsTnI) values and significant  changes across serial measurements may suggest ACS but many other  chronic and acute conditions are known to elevate hsTnI results.  Refer to the "Links" section for chest pain algorithms and additional  guidance. Performed at [facility], [address] [place], [place] [address]   Resp Panel by RT-PCR (Flu A&B, Covid) Nasopharyngeal Swab     Status: None   Collection Time: 10/21/21 12:57 AM   Specimen: Nasopharyngeal Swab; Nasopharyngeal(NP) swabs in vial transport medium  Result Value Ref Range   SARS Coronavirus 2 by RT PCR NEGATIVE NEGATIVE    Comment: (NOTE) SARS-CoV-2 target nucleic acids are NOT DETECTED.  The SARS-CoV-2 RNA is generally detectable in upper respiratory specimens during the acute phase of infection. The lowest concentration of SARS-CoV-2 viral copies this assay can detect is 138 copies/mL. A negative result does not preclude SARS-Cov-2 infection and should not be used as the sole basis for treatment or other patient management decisions. A negative result may occur with  improper specimen collection/handling, submission of specimen other than nasopharyngeal swab, presence of viral mutation(s) within the areas targeted by this assay, and inadequate number of viral copies(<138  copies/mL). A negative result must be combined with clinical observations, patient history, and epidemiological information. The expected result is Negative.  Fact Sheet for Patients:  EntrepreneurPulse.com.au  Fact Sheet for Healthcare Providers:  IncredibleEmployment.be  This test is no t yet approved or cleared by the [place] FDA and  has been authorized for detection and/or diagnosis of SARS-CoV-2 by FDA under an Emergency  Use Authorization (EUA). This EUA will remain  in effect (meaning this test can be used) for the duration of the COVID-19 declaration under Section 564(b)(1) of the Act, 21 U.S.C.section 360bbb-3(b)(1), unless the authorization is terminated  or revoked sooner.       Influenza A by PCR NEGATIVE NEGATIVE   Influenza B by PCR NEGATIVE NEGATIVE    Comment: (NOTE) The Xpert Xpress SARS-CoV-2/FLU/RSV plus assay is intended as an aid in the diagnosis of influenza from Nasopharyngeal swab specimens and should not be used as a sole basis for treatment. Nasal washings and aspirates are unacceptable for Xpert Xpress SARS-CoV-2/FLU/RSV testing.  Fact Sheet for Patients: EntrepreneurPulse.com.au  Fact Sheet for Healthcare Providers: IncredibleEmployment.be  This test is not yet approved or cleared by the [place] FDA and has been authorized for detection and/or diagnosis of SARS-CoV-2 by FDA under an Emergency Use Authorization (EUA). This EUA will remain in effect (meaning this test can be used) for the duration of the COVID-19 declaration under Section 564(b)(1) of the Act, 21 U.S.C. section 360bbb-3(b)(1), unless the authorization is terminated or revoked.  Performed at [facility], [address] [place], [place] [address]   Troponin I (High Sensitivity)     Status: None   Collection Time: 10/21/21  2:28 AM  Result Value Ref Range   Troponin I (High  Sensitivity) 4 <18 ng/L    Comment: (NOTE) Elevated high sensitivity troponin I (hsTnI) values and significant  changes across serial measurements may suggest ACS but many other  chronic and acute conditions are known to elevate hsTnI results.  Refer to the "Links" section for chest pain algorithms and additional  guidance. Performed at [facility], [address] [place], [place] [address]   Urinalysis, Routine w reflex microscopic Urine, Clean Catch     Status: Abnormal   Collection Time: 10/21/21  3:30 AM  Result Value Ref Range   Color, Urine YELLOW YELLOW   APPearance CLEAR CLEAR   Specific Gravity, Urine <=1.005 1.005 - 1.030   pH 5.5 5.0 - 8.0   Glucose, UA 250 (A) NEGATIVE mg/dL   Hgb urine dipstick TRACE (A) NEGATIVE   Bilirubin Urine NEGATIVE NEGATIVE   Ketones, ur NEGATIVE NEGATIVE mg/dL   Protein, ur NEGATIVE NEGATIVE mg/dL   Nitrite NEGATIVE NEGATIVE   Leukocytes,Ua NEGATIVE NEGATIVE    Comment: Performed at [facility], [address] [place], [place] [address]  Urinalysis, Microscopic (reflex)     Status: Abnormal   Collection Time: 10/21/21  3:30 AM  Result Value Ref Range   RBC / HPF 0-5 0 - 5 RBC/hpf   WBC, UA 0-5 0 - 5 WBC/hpf   Bacteria, UA RARE (A) NONE SEEN   Squamous Epithelial / LPF 0-5 0 - 5   Mucus PRESENT     Comment: Performed at [facility], [address] [place], [place] [address]   CT Angio Chest/Abd/Pel for Dissection W and/or Wo Contrast  Result Date: 10/21/2021 CLINICAL DATA:  Syncopal episode. EXAM: CT ANGIOGRAPHY CHEST, ABDOMEN AND PELVIS TECHNIQUE: Non-contrast CT of the chest was initially obtained. Multidetector CT imaging through the chest, abdomen and pelvis was performed using the standard protocol during bolus administration of intravenous contrast. Multiplanar reconstructed images and MIPs were obtained and reviewed to evaluate the vascular anatomy. RADIATION DOSE REDUCTION: This exam was  performed according to the departmental dose-optimization program which includes automated exposure control, adjustment of the mA  and/or kV according to patient size and/or use of iterative reconstruction technique. CONTRAST:  146m OMNIPAQUE IOHEXOL 350 MG/ML SOLN COMPARISON:  None Available. FINDINGS: CTA CHEST FINDINGS Cardiovascular: Satisfactory opacification of the pulmonary arteries to the segmental level. No evidence of pulmonary embolism. Normal heart size. No pericardial effusion. Mediastinum/Nodes: No enlarged mediastinal, hilar, or axillary lymph nodes. Thyroid gland, trachea, and esophagus demonstrate no significant findings. Lungs/Pleura: Lungs are clear. No pleural effusion or pneumothorax. Musculoskeletal: No chest wall abnormality. No acute or significant osseous findings. Review of the MIP images confirms the above findings. CTA ABDOMEN AND PELVIS FINDINGS VASCULAR Aorta: Normal caliber aorta without aneurysm, dissection, vasculitis or significant stenosis. Celiac: Patent without evidence of aneurysm, dissection, vasculitis or significant stenosis. SMA: Patent without evidence of aneurysm, dissection, vasculitis or significant stenosis. Renals: Both renal arteries are patent without evidence of aneurysm, dissection, vasculitis, fibromuscular dysplasia or significant stenosis. IMA: Patent without evidence of aneurysm, dissection, vasculitis or significant stenosis. Inflow: Patent without evidence of aneurysm, dissection, vasculitis or significant stenosis. Veins: No obvious venous abnormality within the limitations of this arterial phase study. Review of the MIP images confirms the above findings. NON-VASCULAR Hepatobiliary: There is diffuse fatty infiltration of the liver parenchyma. No focal liver abnormality is seen. No gallstones, gallbladder wall thickening, or biliary dilatation. Pancreas: Unremarkable. No pancreatic ductal dilatation or surrounding inflammatory changes. Spleen: Normal in size  without focal abnormality. Adrenals/Urinary Tract: Adrenal glands are unremarkable. Kidneys are normal in size, without obstructing renal calculi or hydronephrosis. A 4.1 cm x 4.0 cm simple cyst is seen within the posteromedial aspect of the mid to upper right kidney. An additional smaller simple cyst is seen within the posterior aspect of the mid to lower right kidney. No additional follow-up imaging is recommended. Multiple subcentimeter nonobstructing renal calculi are seen within the right kidney. Bladder is unremarkable. Stomach/Bowel: Stomach is within normal limits. Appendix appears normal. No evidence of bowel wall thickening, distention, or inflammatory changes. Lymphatic: No abnormal abdominal or pelvic lymph nodes are identified. Reproductive: Prostate is unremarkable. Other: No abdominal wall hernia or abnormality. No abdominopelvic ascites. Musculoskeletal: No acute or significant osseous findings. Review of the MIP images confirms the above findings. IMPRESSION: 1. No evidence of pulmonary embolism or other acute intrathoracic process. 2. Hepatic steatosis. 3. Multiple subcentimeter nonobstructing right renal calculi. Electronically Signed   By: TVirgina NorfolkM.D.   On: 10/21/2021 02:01    Pending Labs Unresulted Labs (From admission, onward)    None       Vitals/Pain Today's Vitals   10/21/21 0730 10/21/21 0800 10/21/21 0940 10/21/21 1100  BP: (!) 125/99 124/87  125/90  Pulse: (!) 110 (!) 109  (!) 102  Resp: (!) 30 20  (!) 24  Temp:      TempSrc:      SpO2: 96% 95%  96%  Weight:      Height:      PainSc:   0-No pain     Isolation Precautions Airborne and Contact precautions  Medications Medications  sodium chloride 0.9 % bolus 1,000 mL (0 mLs Intravenous Stopped 10/21/21 0136)  ondansetron (ZOFRAN) injection 4 mg (4 mg Intravenous Given 10/21/21 0045)  iohexol (OMNIPAQUE) 350 MG/ML injection 125 mL (125 mLs Intravenous Contrast Given 10/21/21 0142)  ketorolac (TORADOL)  30 MG/ML injection 30 mg (30 mg Intravenous Given 10/21/21 0304)  sodium chloride 0.9 % bolus 500 mL (0 mLs Intravenous Stopped 10/21/21 0606)    Mobility walks Low fall risk   Focused Assessments Cardiac  Assessment Handoff:    No results found for: CKTOTAL, CKMB, CKMBINDEX, TROPONINI No results found for: DDIMER Does the Patient currently have chest pain? No    R Recommendations: See Admitting Provider Note  Report given to:   Additional Notes:

## 2021-10-21 NOTE — ED Notes (Signed)
Pt states he is not comfortable lying flat. He states he vomited earlier and feels like he aspirated vomit. Pt is setting about 45 degree.

## 2021-10-21 NOTE — ED Notes (Signed)
Called [facility] for consult to Hospitalist per Physician - Vasovagal syncope, Right bundle branch block, Tachycardia '@4'$ :21. Spoke with Tammy

## 2021-10-21 NOTE — H&P (Signed)
History and Physical    Patient: Paul Warner IEP:[id number] DOB: 1961/01/08 DOA: 10/21/2021 DOS: the patient was seen and examined on 10/21/2021 PCP: [name], MD  Patient coming from: Home  Chief Complaint:  Chief Complaint  Patient presents with   Loss of Consciousness   HPI: Paul Warner is a 61 y.o. male with medical history significant of HTN, HLD, DM2. Presenting with fainting. He reports that last night he ate dinner. Shortly after he had some chest tightness. He thought it was gas. He went to the bathroom and had an episode of diarrhea. While he was sitting on the commode, he felt sweaty and shaky. He texted his wife to ask her to bring him something to vomit into. When she came into the bathroom, he was slumped to the side and not talking. He tried to wake him, but it took a couple of minutes before he came around. During this time he was vomiting. He does not remember passing out. He states that when he came to, he noticed vomit everywhere. His wife became concerned and drove him to the ED. She reports that he had an episode of passing out a few weeks ago when he tried to go to the bathroom in the middle of the night. He apparently didn't make it. He found himself on the floor. He thought that it may be d/t the melatonin he was taking. He denies any other aggravating or alleviating factors.   Review of Systems: As mentioned in the history of present illness. All other systems reviewed and are negative. Past Medical History:  Diagnosis Date   Allergy    seasonal   Arthritis    Blood transfusion 09/2011   2 units   Diabetes mellitus without complication ([place])    History of adenomatous polyp of colon 10/07/2019   Due 10/03/2022 per Dr. Henrene Pastor   Hyperlipidemia    Hypertension    Renal disorder    Stomach ulcer    Past Surgical History:  Procedure Laterality Date   COLONOSCOPY     ESOPHAGOGASTRODUODENOSCOPY  10/26/2011   Procedure: ESOPHAGOGASTRODUODENOSCOPY (EGD);   Surgeon: Irene Shipper, MD;  Location: [facility] ENDOSCOPY;  Service: Endoscopy;  Laterality: N/A;   FRACTURE SURGERY  broken collar bone   POLYPECTOMY     UPPER GASTROINTESTINAL ENDOSCOPY     Social History:  reports that he has never smoked. He has never used smokeless tobacco. He reports current alcohol use of about 1.0 standard drink per week. He reports that he does not use drugs.  Allergies  Allergen Reactions   Codeine Nausea And Vomiting   Nsaids Other (See Comments)    Bleeding ulcer 2013    Family History  Adopted: Yes  Problem Relation Age of Onset   Breast cancer Mother    Colon cancer Neg Hx    Colon polyps Neg Hx    Esophageal cancer Neg Hx    Rectal cancer Neg Hx    Stomach cancer Neg Hx     Prior to Admission medications   Medication Sig Start Date End Date Taking? Authorizing Provider  aspirin 81 MG tablet Take 81 mg by mouth daily. Reported on 09/23/2015    [provider]  Blood Glucose Monitoring Suppl (FREESTYLE LITE) DEVI 1 Device by Does not apply route 3 (three) times daily. 09/05/18   [name], NP  Cholecalciferol (VITAMIN D PO) Take 10,000 Units by mouth daily.    [provider]  glipiZIDE (GLUCOTROL) 5 MG tablet  TAKE 1 TABLET 3 TIMES A DAY WITH MEALS FOR DIABETES 05/06/20   [name], MD  glucose blood (FREESTYLE LITE) test strip Check blood sugar 1 to 2 times daily. [id number] 08/17/20   [name], MD  Lancets (FREESTYLE) lancets Use as instructed 09/05/18   [name], NP  meloxicam (MOBIC) 7.5 MG tablet TAKE 1 TABLET (7.5 MG TOTAL) BY MOUTH DAILY FOR TWO WEEKS, THEN AS NEEDED. 05/11/20   Garnet Sierras, NP  metFORMIN (GLUCOPHAGE-XR) 500 MG 24 hr tablet TAKE 2 TABLETS 2 TIMES A DAY WITH MEALS FOR DIABETES 05/15/21   [name], NP  Multiple Vitamins-Minerals (MULTIVITAMIN WITH MINERALS) tablet Take 1 tablet by mouth daily.    [provider]  olmesartan-hydrochlorothiazide (BENICAR HCT) 20-12.5 MG tablet TAKE  1 TABLET DAILY FOR BLOOD PRESSURE 06/01/21   [name], NP  Omega-3 Fatty Acids (FISH OIL OMEGA-3 PO) Take 1 capsule by mouth daily.    [provider]  ondansetron (ZOFRAN) 4 MG tablet Take 1 tablet (4 mg total) by mouth every 6 (six) hours as needed for nausea. 1/61/09   Delora Fuel, MD  oxyCODONE-acetaminophen (PERCOCET/ROXICET) 5-325 MG tablet Take 1 tablet by mouth every 6 (six) hours as needed for severe pain. 10/31/52   Delora Fuel, MD  predniSONE (DELTASONE) 50 MG tablet Take 1 tablet (50 mg total) by mouth daily. 0/98/11   Delora Fuel, MD  rosuvastatin (CRESTOR) 10 MG tablet Take  1 tablet  Daily  for Cholesterol Patient not taking: Reported on 07/07/2021 05/06/21   [name], NP  tirzepatide [facility]) 12.5 MG/0.5ML Pen Inject 12.5 mg into the skin once a week. 07/07/21   Magda Bernheim, NP  Zinc 50 MG TABS Take 1 tablet Daily 12/14/18   [name], MD    Physical Exam: Vitals:   10/21/21 0730 10/21/21 0800 10/21/21 1100 10/21/21 1254  BP: (!) 125/99 124/87 125/90 136/86  Pulse: (!) 110 (!) 109 (!) 102 (!) 109  Resp: (!) 30 20 (!) 24 (!) 24  Temp:    98.4 F (36.9 C)  TempSrc:    Oral  SpO2: 96% 95% 96% 99%  Weight:      Height:       General: 61 y.o. male resting in bed in NAD Eyes: PERRL, normal sclera ENMT: Nares patent w/o discharge, orophaynx clear, dentition normal, ears w/o discharge/lesions/ulcers Neck: Supple, trachea midline Cardiovascular: tachy, +S1, S2, no m/g/r, equal pulses throughout Respiratory: CTABL, no w/r/r, normal WOB GI: BS+, NDNT, no masses noted, no organomegaly noted MSK: No e/c/c Neuro: A&O x 3, no focal deficits Psyc: Appropriate interaction and affect, calm/cooperative  Data Reviewed:  Na+  137 Glucose  223 Scr  1.36 WBC  11.6  CTA chest/ab/pelvis 1. No evidence of pulmonary embolism or other acute intrathoracic process. 2. Hepatic steatosis. 3. Multiple subcentimeter nonobstructing right renal calculi.  EKG:  sinus tach, RBBB, no st elevations  Assessment and Plan: Syncope     - place in obs, tele     - sounds like he's had multiple syncopal episodes over the last few weeks     - repeat orthostatics after fluids     - check echo     - add EEG     - trp negative, UA negative, no infection noted on imaging     - UDS     - may need tilt table if the above w/u is negative   HTN     - hold home regimen for now  DM2     - A1c, SSI, glucose checks, DM diet  HLD     - continue statin  AKI     - imaging without obstruction     - watch nephrotoxins     - fluids  Advance Care Planning:   Code Status: FULL  Consults: None  Family Communication: w/ wife at bedside  Severity of Illness: The appropriate patient status for this patient is OBSERVATION. Observation status is judged to be reasonable and necessary in order to provide the required intensity of service to ensure the patient's safety. The patient's presenting symptoms, physical exam findings, and initial radiographic and laboratory data in the context of their medical condition is felt to place them at decreased risk for further clinical deterioration. Furthermore, it is anticipated that the patient will be medically stable for discharge from the hospital within 2 midnights of admission.   Author: Jonnie Finner, DO 10/21/2021 1:23 PM  For on call review [email or web address].

## 2021-10-21 NOTE — Progress Notes (Signed)
  Transition of Care [facility]) Screening Note   Patient Details  Name: Paul Warner Date of Birth: Jul 17, 1960   Transition of Care [facility]) CM/SW Contact:    Dessa Phi, RN Phone Number: 10/21/2021, 1:26 PM    Transition of Care Department [facility]) has reviewed patient and no TOC needs have been identified at this time. We will continue to monitor patient advancement through interdisciplinary progression rounds. If new patient transition needs arise, please place a TOC consult.

## 2021-10-21 NOTE — ED Notes (Signed)
Patient transported to CT 

## 2021-10-22 ENCOUNTER — Observation Stay (HOSPITAL_COMMUNITY)
Admit: 2021-10-22 | Discharge: 2021-10-22 | Disposition: A | Discharge status: Home / Self Care | Payer: Commercial Managed Care - PPO | Attending: Internal Medicine | Admitting: Internal Medicine

## 2021-10-22 ENCOUNTER — Other Ambulatory Visit: Payer: Self-pay | Admitting: Student

## 2021-10-22 DIAGNOSIS — R197 Diarrhea, unspecified: Secondary | ICD-10-CM | POA: Diagnosis not present

## 2021-10-22 DIAGNOSIS — R55 Syncope and collapse: Secondary | ICD-10-CM | POA: Diagnosis not present

## 2021-10-22 DIAGNOSIS — N179 Acute kidney failure, unspecified: Secondary | ICD-10-CM | POA: Diagnosis not present

## 2021-10-22 DIAGNOSIS — E119 Type 2 diabetes mellitus without complications: Secondary | ICD-10-CM | POA: Diagnosis not present

## 2021-10-22 DIAGNOSIS — I10 Essential (primary) hypertension: Secondary | ICD-10-CM

## 2021-10-22 LAB — COMPREHENSIVE METABOLIC PANEL
ALT: 42 U/L (ref 0–44)
AST: 26 U/L (ref 15–41)
Albumin: 3.9 g/dL (ref 3.5–5.0)
Alkaline Phosphatase: 42 U/L (ref 38–126)
Anion gap: 8 (ref 5–15)
BUN: 14 mg/dL (ref 6–20)
CO2: 23 mmol/L (ref 22–32)
Calcium: 9 mg/dL (ref 8.9–10.3)
Chloride: 109 mmol/L (ref 98–111)
Creatinine, Ser: 1.07 mg/dL (ref 0.61–1.24)
GFR, Estimated: >60 mL/min (ref >60)
Glucose, Bld: 182 mg/dL — ABNORMAL HIGH (ref 70–99)
Potassium: 3.8 mmol/L (ref 3.5–5.1)
Sodium: 140 mmol/L (ref 135–145)
Total Bilirubin: 2.1 mg/dL — ABNORMAL HIGH (ref 0.3–1.2)
Total Protein: 7 g/dL (ref 6.5–8.1)

## 2021-10-22 LAB — GLUCOSE, CAPILLARY
Glucose-Capillary: 183 mg/dL — ABNORMAL HIGH (ref 70–99)
Glucose-Capillary: 154 mg/dL — ABNORMAL HIGH (ref 70–99)
Glucose-Capillary: 161 mg/dL — ABNORMAL HIGH (ref 70–99)
Glucose-Capillary: 264 mg/dL — ABNORMAL HIGH (ref 70–99)
Glucose-Capillary: 232 mg/dL — ABNORMAL HIGH (ref 70–99)

## 2021-10-22 LAB — CBC
HCT: 41.9 % (ref 39.0–52.0)
Hemoglobin: 14.6 g/dL (ref 13.0–17.0)
MCH: 32.2 pg (ref 26.0–34.0)
MCHC: 34.8 g/dL (ref 30.0–36.0)
MCV: 92.3 fL (ref 80.0–100.0)
Platelets: 178 10*3/uL (ref 150–400)
RBC: 4.54 MIL/uL (ref 4.22–5.81)
RDW: 12.4 % (ref 11.5–15.5)
WBC: 13.7 10*3/uL — ABNORMAL HIGH (ref 4.0–10.5)
nRBC: 0 % (ref 0.0–0.2)

## 2021-10-22 LAB — HIV ANTIBODY (ROUTINE TESTING W REFLEX): HIV Screen 4th Generation wRfx: NONREACTIVE

## 2021-10-22 MED ORDER — PREDNISONE 20 MG PO TABS
40.0000 mg | ORAL_TABLET | Freq: Every day | ORAL | Status: DC
  Administered 2021-10-22 – 2021-10-23 (×2): 40 mg via ORAL
  Filled 2021-10-22 (×2): qty 2

## 2021-10-22 MED ORDER — IRBESARTAN 150 MG PO TABS
150.0000 mg | ORAL_TABLET | Freq: Every day | ORAL | Status: DC
Start: 2021-10-22 — End: 2021-10-23
  Administered 2021-10-22: 150 mg via ORAL
  Filled 2021-10-22: qty 1

## 2021-10-22 MED ORDER — ENOXAPARIN SODIUM 40 MG/0.4ML IJ SOSY
40.0000 mg | PREFILLED_SYRINGE | INTRAMUSCULAR | Status: DC
Start: 2021-10-22 — End: 2021-10-23
  Administered 2021-10-22: 40 mg via SUBCUTANEOUS
  Filled 2021-10-22: qty 0.4

## 2021-10-22 NOTE — Progress Notes (Signed)
Ordered 2 week Zio monitor for further evaluation of syncope at the request of Dr. Darrick Meigs. Patient has never been seen by our office so will also arrange a New Patient visit with Dr. Marlou Porch (doctor of the day today) in 4-6 weeks to review monitor results.  Darreld Mclean, PA-C 10/22/2021 10:31 AM

## 2021-10-22 NOTE — Progress Notes (Signed)
[facility]  PROGRESS NOTE  Paul Warner VFI:[id number] DOB: 09-Oct-1960 DOA: 10/21/2021 PCP: [name], MD   Brief HPI:   61 year old male with medical history of hypertension, hyperlipidemia, diabetes mellitus type 2 presented with fainting.  Patient states that after he ate dinner he had some chest tightness.  He thought it was a gas.  He went to the bathroom had episode of diarrhea.  After that he felt sweaty and shaky and then passed out.  He also vomited.  Patient had a similar episode of passing out few weeks ago when it woke up from the bed to go to bathroom in the middle of night and then passed out. Patient states that these episodes started after he started taking new medication for diabetes, tirzepatide, weekly injections.  Patient also takes Benicar and HCTZ at home In the hospital patient did not have orthostatic hypotension He was given IV fluids. CTA chest was negative for pulmonary embolism    Subjective   Patient seen and examined, no further episodes of dizziness or syncope.  Telemetry shows normal sinus rhythm.  Echocardiogram showed EF of 65 to 41%, grade 1 diastolic dysfunction.   Assessment/Plan:   Syncope -Likely vasovagal versus orthostatic hypotension from dehydration/diarrhea -Patient was having diarrhea and vomited with syncope -Improved after IV hydration -Troponin 3 -We will discontinue HCTZ at discharge -Also recommended to stop taking tirzepatide injections -Echocardiogram shows EF of 65 to 66%, grade 1 diastolic dysfunction -EEG is currently pending -Have called cardiology to set up 2-week ZIO monitor as outpatient; patient will follow-up with cardiology in 4 to 6 weeks  Diabetes mellitus type 2 -Patient takes metformin and tirzepatide injections at home -Patient states that this episode started after he started taking his appetite -He was experiencing vomiting and diarrhea; side effect of tirzepatide -We will discontinue  tirzepatide injections at discharge -Continue metformin and will start Amaryl 2 mg every morning -Hemoglobin A1c is 9.0 which is worse from December at that time A1c was 6.6  Hypertension -Blood pressure is elevated -We will restart olmesartan 20 mg daily and hold HCTZ -Monitor patient's blood pressure in the hospital -Continue to check orthostatic vital signs  Acute kidney injury -Creatinine was elevated 1.36 -Resolved with IV fluids -We will discontinue IV fluids -Likely from HCTZ and diarrhea with dehydration -Likely side effect of tirzepatide  Gout -Patient having gout flare in foot -Start prednisone 40 mg p.o. daily for 5 days  Medications     guaiFENesin  600 mg Oral BID   insulin aspart  0-15 Units Subcutaneous TID WC   insulin aspart  0-5 Units Subcutaneous QHS   pantoprazole  40 mg Oral Daily   predniSONE  40 mg Oral Q breakfast   sodium chloride flush  3 mL Intravenous Q12H     Data Reviewed:   CBG:  Recent Labs  Lab 10/21/21 1641 10/21/21 2149 10/22/21 0506 10/22/21 0726 10/22/21 1133  GLUCAP 151* 169* 183* 154* 161*    SpO2: 100 %    Vitals:   10/22/21 0244 10/22/21 0500 10/22/21 0509 10/22/21 1236  BP: 118/74  127/76 (!) 153/86  Pulse: 98  (!) 108 97  Resp: 20  20   Temp: 98.9 F (37.2 C)  99.5 F (37.5 C)   TempSrc: Oral  Oral   SpO2: 96%  96% 100%  Weight:  115.8 kg    Height:          Data Reviewed:  Basic Metabolic Panel: Recent Labs  Lab  10/21/21 0052 10/22/21 0520  NA 137 140  K 3.6 3.8  CL 100 109  CO2 28 23  GLUCOSE 223* 182*  BUN 17 14  CREATININE 1.36* 1.07  CALCIUM 9.4 9.0    CBC: Recent Labs  Lab 10/21/21 0052 10/22/21 0520  WBC 11.1* 13.7*  NEUTROABS 5.3  --   HGB 16.1 14.6  HCT 44.4 41.9  MCV 88.8 92.3  PLT 239 178    LFT Recent Labs  Lab 10/21/21 0052 10/22/21 0520  AST 39 26  ALT 60* 42  ALKPHOS 50 42  BILITOT 1.0 2.1*  PROT 7.8 7.0  ALBUMIN 4.2 3.9      Antibiotics: Anti-infectives (From admission, onward)    None        DVT prophylaxis: Lovenox  Code Status: Full code  Family Communication: No family at bedside   CONSULTS    Objective    Physical Examination:   General-appears in no acute distress Heart-S1-S2, regular, no murmur auscultated Lungs-clear to auscultation bilaterally, no wheezing or crackles auscultated Abdomen-soft, nontender, no organomegaly Extremities-no edema in the lower extremities Neuro-alert, oriented x3, no focal deficit noted  Status is: Inpatient: Syncope           [place]   [facility] If 7PM-7AM, please contact night-coverage at [email or web address], Office  [phone]   10/22/2021, 1:57 PM  LOS: 0 days

## 2021-10-22 NOTE — Progress Notes (Signed)
EEG complete - results pending 

## 2021-10-23 DIAGNOSIS — R55 Syncope and collapse: Secondary | ICD-10-CM | POA: Diagnosis not present

## 2021-10-23 DIAGNOSIS — N179 Acute kidney failure, unspecified: Secondary | ICD-10-CM | POA: Diagnosis not present

## 2021-10-23 DIAGNOSIS — E119 Type 2 diabetes mellitus without complications: Secondary | ICD-10-CM | POA: Diagnosis not present

## 2021-10-23 DIAGNOSIS — R197 Diarrhea, unspecified: Secondary | ICD-10-CM | POA: Diagnosis not present

## 2021-10-23 LAB — GLUCOSE, CAPILLARY
Glucose-Capillary: 185 mg/dL — ABNORMAL HIGH (ref 70–99)
Glucose-Capillary: 151 mg/dL — ABNORMAL HIGH (ref 70–99)

## 2021-10-23 MED ORDER — PREDNISONE 10 MG PO TABS: ORAL_TABLET | ORAL | 0 refills | Status: DC

## 2021-10-23 MED ORDER — GLIMEPIRIDE 2 MG PO TABS: 2.0000 mg | ORAL_TABLET | Freq: Every day | ORAL | 11 refills | Status: DC

## 2021-10-23 MED ORDER — PANTOPRAZOLE SODIUM 40 MG PO TBEC: 40.0000 mg | DELAYED_RELEASE_TABLET | Freq: Every day | ORAL | 0 refills | Status: DC

## 2021-10-23 MED ORDER — OLMESARTAN MEDOXOMIL 20 MG PO TABS: 20.0000 mg | ORAL_TABLET | Freq: Every day | ORAL | 2 refills | Status: DC

## 2021-10-23 NOTE — Procedures (Signed)
Patient Name: [name]  MRN: [id number]  Epilepsy Attending: Lora Havens  Referring Physician/Provider: Jonnie Finner, DO Date: 10/22/2021 Duration: 23.20 mins  Patient history: 61yo M with syncope. EEG to evaluate for seizure  Level of alertness: Awake, asleep  AEDs during EEG study: None  Technical aspects: This EEG study was done with scalp electrodes positioned according to the 10-20 International system of electrode placement. Electrical activity was acquired at a sampling rate of '500Hz'$  and reviewed with a high frequency filter of '70Hz'$  and a low frequency filter of '1Hz'$ . EEG data were recorded continuously and digitally stored.   Description: The posterior dominant rhythm consists of 9-10 Hz activity of moderate voltage (25-35 uV) seen predominantly in posterior head regions, symmetric and reactive to eye opening and eye closing. Sleep was characterized by vertex waves, sleep spindles (12 to 14 Hz), maximal frontocentral region. Hyperventilation and photic stimulation were not performed.     IMPRESSION: This study is within normal limits. No seizures or epileptiform discharges were seen throughout the recording.  Paul Warner

## 2021-10-23 NOTE — Discharge Summary (Signed)
Physician Discharge Summary   Patient: Paul Warner MRN: [id number] DOB: 1960/07/09  Admit date:     10/21/2021  Discharge date: 10/23/21  Discharge Physician: Oswald Hillock   PCP: [name], MD   Recommendations at discharge:   Follow-up cardiology in 4 weeks Follow-up PCP in 1 week Stop taking tirzepatide injections. Benicar HCTZ has been changed to Benicar 20 mg p.o. daily Continue TED hose daily  Discharge Diagnoses: Principal Problem:   Syncope Active Problems:   Hypertension   Hyperlipidemia associated with type 2 diabetes mellitus ([place])   T2_NIDDM ([place])   AKI (acute kidney injury) ([place])  Resolved Problems:   * No resolved hospital problems. *  Hospital Course:  61 year old male with medical history of hypertension, hyperlipidemia, diabetes mellitus type 2 presented with fainting.  Patient states that after he ate dinner he had some chest tightness.  He thought it was a gas.  He went to the bathroom had episode of diarrhea.  After that he felt sweaty and shaky and then passed out.  He also vomited.  Patient had a similar episode of passing out few weeks ago when it woke up from the bed to go to bathroom in the middle of night and then passed out. Patient states that these episodes started after he started taking new medication for diabetes, tirzepatide, weekly injections.  Patient also takes Benicar and HCTZ at home In the hospital patient did not have orthostatic hypotension He was given IV fluids. CTA chest was negative for pulmonary embolism  Assessment and Plan:  Syncope -Likely vasovagal versus orthostatic hypotension from dehydration/diarrhea -Patient was having diarrhea and vomited with syncope -Improved after IV hydration -Troponin 3 -We will discontinue HCTZ at discharge -Also recommended to stop taking tirzepatide injections -Echocardiogram shows EF of 65 to 08%, grade 1 diastolic dysfunction -EEG was done and was unremarkable -Have called  cardiology to set up 2-week ZIO AT 2-week monitor as outpatient; patient will follow-up with cardiology in 4 to 6 weeks   Diabetes mellitus type 2 -Patient takes metformin and tirzepatide injections at home -Patient states that this episode started after he started taking his appetite -He was experiencing vomiting and diarrhea; side effect of tirzepatide -We will discontinue tirzepatide injections at discharge -Continue metformin and will start Amaryl 2 mg every morning -Hemoglobin A1c is 9.0 which is worse from December at that time A1c was 6.6 -Patient will need adjustment of Amaryl and also might need Lantus; he will follow-up with PCP in 1 week to discuss changes in medications based on patient's blood glucose   Hypertension -Blood pressure is elevated -We will restart olmesartan 20 mg daily and hold HCTZ -Monitor patient's blood pressure in the hospital -He has mild orthostatic hypotension, will start TED hose   Acute kidney injury -Creatinine was elevated 1.36 -Resolved with IV fluids -We will discontinue IV fluids -Likely from HCTZ and diarrhea with dehydration -Diarrhea and dehydration from likely side effect of tirzepatide   Gout -Patient having gout flare in foot -Start prednisone 40 mg p.o. daily  -We will discharge on prednisone taper for 4 more days         Consultants:  Procedures performed:  Disposition: Home Diet recommendation:  Discharge Diet Orders (From admission, onward)     Start     Ordered   10/23/21 0000  Diet Carb Modified        10/23/21 1001           Carb modified diet DISCHARGE MEDICATION: Allergies  as of 10/23/2021       Reactions   Codeine Itching   Nsaids Other (See Comments)   Bleeding ulcer 2013        Medication List     STOP taking these medications    glipiZIDE 5 MG tablet Commonly known as: GLUCOTROL   meloxicam 7.5 MG tablet Commonly known as: MOBIC   Mounjaro 12.5 MG/0.5ML Pen Generic drug: tirzepatide    Mounjaro 7.5 MG/0.5ML Pen Generic drug: tirzepatide   olmesartan-hydrochlorothiazide 20-12.5 MG tablet Commonly known as: BENICAR HCT   oxyCODONE-acetaminophen 5-325 MG tablet Commonly known as: PERCOCET/ROXICET   TUMS PO       TAKE these medications    aspirin 81 MG tablet Take 81 mg by mouth daily. Reported on 09/23/2015   FISH OIL OMEGA-3 PO Take 1 capsule by mouth daily.   freestyle lancets Use as instructed   FreeStyle Lite Devi 1 Device by Does not apply route 3 (three) times daily.   FREESTYLE LITE test strip Generic drug: glucose blood Check blood sugar 1 to 2 times daily. DX-E11.9   glimepiride 2 MG tablet Commonly known as: Amaryl Take 1 tablet (2 mg total) by mouth daily with breakfast.   ibuprofen 200 MG tablet Commonly known as: ADVIL Take 200 mg by mouth daily as needed (pain).   metFORMIN 500 MG 24 hr tablet Commonly known as: GLUCOPHAGE-XR TAKE 2 TABLETS 2 TIMES A DAY WITH MEALS FOR DIABETES What changed:  how much to take how to take this when to take this additional instructions   multivitamin with minerals tablet Take 1 tablet by mouth daily.   olmesartan 20 MG tablet Commonly known as: Benicar Take 1 tablet (20 mg total) by mouth daily.   ondansetron 4 MG tablet Commonly known as: ZOFRAN Take 1 tablet (4 mg total) by mouth every 6 (six) hours as needed for nausea.   OVER THE COUNTER MEDICATION Take 1 tablet by mouth daily. Focus factor   pantoprazole 40 MG tablet Commonly known as: PROTONIX Take 1 tablet (40 mg total) by mouth daily.   predniSONE 10 MG tablet Commonly known as: DELTASONE Prednisone 30 mg po daily x 1 day then Prednisone 20 mg po daily x 1 day then Prednisone 10 mg daily x 1 day then stop... What changed:  medication strength how much to take how to take this when to take this additional instructions   rosuvastatin 10 MG tablet Commonly known as: Crestor Take  1 tablet  Daily  for Cholesterol    VITAMIN D PO Take 1 capsule by mouth daily.   Zinc 50 MG Tabs Take 1 tablet Daily What changed:  how much to take how to take this when to take this additional instructions        Follow-up Information     Jerline Pain, MD Follow up.   Specialty: Cardiology Why: Appointment with Cardiology scheduled for 11/23/2021 at 1:40pm to review heart monitor results. Please arrive 15 minutes early for check-in. If this date/time does not work for you, please call our office to reschedule. Contact information: [address] [place] [address] [phone]         [name], MD Follow up in 1 week(s).   Specialty: Internal Medicine Contact information: [id number] [place] [id number] [phone]                Discharge Exam: Filed Weights   10/21/21 0009 10/22/21 0500 10/23/21 0500  Weight: 113.4  kg 115.8 kg 115.6 kg   General-appears in no acute distress Heart-S1-S2, regular, no murmur auscultated Lungs-clear to auscultation bilaterally, no wheezing or crackles auscultated Abdomen-soft, nontender, no organomegaly Extremities-no edema in the lower extremities Neuro-alert, oriented x3, no focal deficit noted  Condition at discharge: good  The results of significant diagnostics from this hospitalization (including imaging, microbiology, ancillary and laboratory) are listed below for reference.   Imaging Studies: EEG adult  Result Date: 11-07-21 Lora Havens, MD     2021-11-07  7:40 AM Patient Name: Paul Warner MRN: [id number] Epilepsy Attending: Lora Havens Referring Physician/Provider: Jonnie Finner, DO Date: 10/22/2021 Duration: 23.20 mins Patient history: 62yo M with syncope. EEG to evaluate for seizure Level of alertness: Awake, asleep AEDs during EEG study: None Technical aspects: This EEG study was done with scalp electrodes positioned according to the 10-20 International system of electrode  placement. Electrical activity was acquired at a sampling rate of '500Hz'$  and reviewed with a high frequency filter of '70Hz'$  and a low frequency filter of '1Hz'$ . EEG data were recorded continuously and digitally stored. Description: The posterior dominant rhythm consists of 9-10 Hz activity of moderate voltage (25-35 uV) seen predominantly in posterior head regions, symmetric and reactive to eye opening and eye closing. Sleep was characterized by vertex waves, sleep spindles (12 to 14 Hz), maximal frontocentral region. Hyperventilation and photic stimulation were not performed.   IMPRESSION: This study is within normal limits. No seizures or epileptiform discharges were seen throughout the recording. Lora Havens   ECHOCARDIOGRAM COMPLETE  Result Date: 10/21/2021    ECHOCARDIOGRAM REPORT   Patient Name:   Paul Warner Binning Date of Exam: 10/21/2021 Medical Rec #:  [id number]        Height:       72.0 in Accession #:    [id number]       Weight:       250.0 lb Date of Birth:  08/08/1960       BSA:          2.343 m Patient Age:    47 years         BP:           129/84 mmHg Patient Gender: M                HR:           102 bpm. Exam Location:  Inpatient Procedure: 2D Echo, Cardiac Doppler, Color Doppler and Intracardiac            Opacification Agent Indications:    Syncope  History:        Patient has no prior history of Echocardiogram examinations.                 Signs/Symptoms:Chest Pain; Risk Factors:Hypertension and                 Diabetes.  Sonographer:    Joette Catching RCS Referring Phys: [id number] [place] Comments: No subcostal window. Image acquisition challenging due to patient body habitus. IMPRESSIONS  1. Left ventricular ejection fraction, by estimation, is 65 to 70%. Left ventricular ejection fraction by 2D MOD biplane is 66.6 %. The left ventricle has normal function. The left ventricle has no regional wall motion abnormalities. There is mild left ventricular hypertrophy. Left  ventricular diastolic parameters are consistent with Grade I diastolic dysfunction (impaired relaxation).  2. Right ventricular systolic function is normal. The right ventricular size is normal.  3. The mitral valve is grossly normal. No evidence of mitral valve regurgitation.  4. The aortic valve is tricuspid. Aortic valve regurgitation is not visualized. Aortic valve sclerosis is present, with no evidence of aortic valve stenosis. Comparison(s): No prior Echocardiogram. FINDINGS  Left Ventricle: Left ventricular ejection fraction, by estimation, is 65 to 70%. Left ventricular ejection fraction by 2D MOD biplane is 66.6 %. The left ventricle has normal function. The left ventricle has no regional wall motion abnormalities. Definity contrast agent was given IV to delineate the left ventricular endocardial borders. The left ventricular internal cavity size was normal in size. There is mild left ventricular hypertrophy. Left ventricular diastolic parameters are consistent with Grade I diastolic dysfunction (impaired relaxation). Indeterminate filling pressures. Right Ventricle: The right ventricular size is normal. No increase in right ventricular wall thickness. Right ventricular systolic function is normal. Left Atrium: Left atrial size was normal in size. Right Atrium: Right atrial size was normal in size. Pericardium: There is no evidence of pericardial effusion. Mitral Valve: The mitral valve is grossly normal. No evidence of mitral valve regurgitation. Tricuspid Valve: The tricuspid valve is grossly normal. Tricuspid valve regurgitation is not demonstrated. Aortic Valve: The aortic valve is tricuspid. Aortic valve regurgitation is not visualized. Aortic valve sclerosis is present, with no evidence of aortic valve stenosis. Aortic valve mean gradient measures 5.0 mmHg. Aortic valve peak gradient measures 8.4  mmHg. Aortic valve area, by VTI measures 3.80 cm. Pulmonic Valve: The pulmonic valve was normal in  structure. Pulmonic valve regurgitation is not visualized. Aorta: The aortic root and ascending aorta are structurally normal, with no evidence of dilitation. IAS/Shunts: No atrial level shunt detected by color flow Doppler.  LEFT VENTRICLE PLAX 2D                        Biplane EF (MOD) LVIDd:         4.25 cm         LV Biplane EF:   Left LVIDs:         2.95 cm                          ventricular LV PW:         1.00 cm                          ejection LV IVS:        1.10 cm                          fraction by LVOT diam:     2.30 cm                          2D MOD LV SV:         84                               biplane is LV SV Index:   36                               66.6 %. LVOT Area:     4.15 cm  Diastology                                LV e' medial:    5.98 cm/s LV Volumes (MOD)               LV E/e' medial:  9.8 LV vol d, MOD    118.0 ml      LV e' lateral:   10.70 cm/s A2C:                           LV E/e' lateral: 5.5 LV vol d, MOD    119.0 ml A4C: LV vol s, MOD    40.8 ml A2C: LV vol s, MOD    38.8 ml A4C: LV SV MOD A2C:   77.2 ml LV SV MOD A4C:   119.0 ml LV SV MOD BP:    82.8 ml RIGHT VENTRICLE RV Basal diam:  3.10 cm RV Mid diam:    2.90 cm RV S prime:     19.40 cm/s TAPSE (M-mode): 2.4 cm LEFT ATRIUM             Index        RIGHT ATRIUM           Index LA diam:        3.00 cm 1.28 cm/m   RA Area:     10.40 cm LA Vol (A2C):   48.3 ml 20.62 ml/m  RA Volume:   22.40 ml  9.56 ml/m LA Vol (A4C):   48.1 ml 20.53 ml/m LA Biplane Vol: 48.2 ml 20.58 ml/m  AORTIC VALVE                     PULMONIC VALVE AV Area (Vmax):    3.87 cm      PR End Diast Vel: 6.97 msec AV Area (Vmean):   3.74 cm AV Area (VTI):     3.80 cm AV Vmax:           145.00 cm/s AV Vmean:          102.000 cm/s AV VTI:            0.220 m AV Peak Grad:      8.4 mmHg AV Mean Grad:      5.0 mmHg LVOT Vmax:         135.00 cm/s LVOT Vmean:        91.800 cm/s LVOT VTI:          0.201 m LVOT/AV VTI ratio:  0.91  AORTA Ao Root diam: 3.60 cm Ao Asc diam:  3.30 cm MITRAL VALVE MV Area (PHT): 4.17 cm    SHUNTS MV Decel Time: 182 msec    Systemic VTI:  0.20 m MV E velocity: 58.50 cm/s  Systemic Diam: 2.30 cm MV A velocity: 93.20 cm/s MV E/A ratio:  0.63 Lyman Bishop MD Electronically signed by Lyman Bishop MD Signature Date/Time: 10/21/2021/5:01:00 PM    Final    CT Angio Chest/Abd/Pel for Dissection W and/or Wo Contrast  Result Date: 10/21/2021 CLINICAL DATA:  Syncopal episode. EXAM: CT ANGIOGRAPHY CHEST, ABDOMEN AND PELVIS TECHNIQUE: Non-contrast CT of the chest was initially obtained. Multidetector CT imaging through the chest, abdomen and pelvis was performed using the standard protocol during bolus administration of intravenous contrast. Multiplanar reconstructed images and MIPs were obtained and reviewed to evaluate the vascular anatomy.  RADIATION DOSE REDUCTION: This exam was performed according to the departmental dose-optimization program which includes automated exposure control, adjustment of the mA and/or kV according to patient size and/or use of iterative reconstruction technique. CONTRAST:  19m OMNIPAQUE IOHEXOL 350 MG/ML SOLN COMPARISON:  None Available. FINDINGS: CTA CHEST FINDINGS Cardiovascular: Satisfactory opacification of the pulmonary arteries to the segmental level. No evidence of pulmonary embolism. Normal heart size. No pericardial effusion. Mediastinum/Nodes: No enlarged mediastinal, hilar, or axillary lymph nodes. Thyroid gland, trachea, and esophagus demonstrate no significant findings. Lungs/Pleura: Lungs are clear. No pleural effusion or pneumothorax. Musculoskeletal: No chest wall abnormality. No acute or significant osseous findings. Review of the MIP images confirms the above findings. CTA ABDOMEN AND PELVIS FINDINGS VASCULAR Aorta: Normal caliber aorta without aneurysm, dissection, vasculitis or significant stenosis. Celiac: Patent without evidence of aneurysm, dissection,  vasculitis or significant stenosis. SMA: Patent without evidence of aneurysm, dissection, vasculitis or significant stenosis. Renals: Both renal arteries are patent without evidence of aneurysm, dissection, vasculitis, fibromuscular dysplasia or significant stenosis. IMA: Patent without evidence of aneurysm, dissection, vasculitis or significant stenosis. Inflow: Patent without evidence of aneurysm, dissection, vasculitis or significant stenosis. Veins: No obvious venous abnormality within the limitations of this arterial phase study. Review of the MIP images confirms the above findings. NON-VASCULAR Hepatobiliary: There is diffuse fatty infiltration of the liver parenchyma. No focal liver abnormality is seen. No gallstones, gallbladder wall thickening, or biliary dilatation. Pancreas: Unremarkable. No pancreatic ductal dilatation or surrounding inflammatory changes. Spleen: Normal in size without focal abnormality. Adrenals/Urinary Tract: Adrenal glands are unremarkable. Kidneys are normal in size, without obstructing renal calculi or hydronephrosis. A 4.1 cm x 4.0 cm simple cyst is seen within the posteromedial aspect of the mid to upper right kidney. An additional smaller simple cyst is seen within the posterior aspect of the mid to lower right kidney. No additional follow-up imaging is recommended. Multiple subcentimeter nonobstructing renal calculi are seen within the right kidney. Bladder is unremarkable. Stomach/Bowel: Stomach is within normal limits. Appendix appears normal. No evidence of bowel wall thickening, distention, or inflammatory changes. Lymphatic: No abnormal abdominal or pelvic lymph nodes are identified. Reproductive: Prostate is unremarkable. Other: No abdominal wall hernia or abnormality. No abdominopelvic ascites. Musculoskeletal: No acute or significant osseous findings. Review of the MIP images confirms the above findings. IMPRESSION: 1. No evidence of pulmonary embolism or other acute  intrathoracic process. 2. Hepatic steatosis. 3. Multiple subcentimeter nonobstructing right renal calculi. Electronically Signed   By: TVirgina NorfolkM.D.   On: 10/21/2021 02:01    Microbiology: Results for orders placed or performed during the hospital encounter of 10/21/21  Resp Panel by RT-PCR (Flu A&B, Covid) Nasopharyngeal Swab     Status: None   Collection Time: 10/21/21 12:57 AM   Specimen: Nasopharyngeal Swab; Nasopharyngeal(NP) swabs in vial transport medium  Result Value Ref Range Status   SARS Coronavirus 2 by RT PCR NEGATIVE NEGATIVE Final    Comment: (NOTE) SARS-CoV-2 target nucleic acids are NOT DETECTED.  The SARS-CoV-2 RNA is generally detectable in upper respiratory specimens during the acute phase of infection. The lowest concentration of SARS-CoV-2 viral copies this assay can detect is 138 copies/mL. A negative result does not preclude SARS-Cov-2 infection and should not be used as the sole basis for treatment or other patient management decisions. A negative result may occur with  improper specimen collection/handling, submission of specimen other than nasopharyngeal swab, presence of viral mutation(s) within the areas targeted by this assay, and inadequate number of  viral copies(<138 copies/mL). A negative result must be combined with clinical observations, patient history, and epidemiological information. The expected result is Negative.  Fact Sheet for Patients:  EntrepreneurPulse.com.au  Fact Sheet for Healthcare Providers:  IncredibleEmployment.be  This test is no t yet approved or cleared by the [place] FDA and  has been authorized for detection and/or diagnosis of SARS-CoV-2 by FDA under an Emergency Use Authorization (EUA). This EUA will remain  in effect (meaning this test can be used) for the duration of the COVID-19 declaration under Section 564(b)(1) of the Act, 21 U.S.C.section 360bbb-3(b)(1), unless the  authorization is terminated  or revoked sooner.       Influenza A by PCR NEGATIVE NEGATIVE Final   Influenza B by PCR NEGATIVE NEGATIVE Final    Comment: (NOTE) The Xpert Xpress SARS-CoV-2/FLU/RSV plus assay is intended as an aid in the diagnosis of influenza from Nasopharyngeal swab specimens and should not be used as a sole basis for treatment. Nasal washings and aspirates are unacceptable for Xpert Xpress SARS-CoV-2/FLU/RSV testing.  Fact Sheet for Patients: EntrepreneurPulse.com.au  Fact Sheet for Healthcare Providers: IncredibleEmployment.be  This test is not yet approved or cleared by the [place] FDA and has been authorized for detection and/or diagnosis of SARS-CoV-2 by FDA under an Emergency Use Authorization (EUA). This EUA will remain in effect (meaning this test can be used) for the duration of the COVID-19 declaration under Section 564(b)(1) of the Act, 21 U.S.C. section 360bbb-3(b)(1), unless the authorization is terminated or revoked.  Performed at [facility], [address] [place], [place] [address]     Labs: CBC: Recent Labs  Lab 10/21/21 0052 10/22/21 0520  WBC 11.1* 13.7*  NEUTROABS 5.3  --   HGB 16.1 14.6  HCT 44.4 41.9  MCV 88.8 92.3  PLT 239 476   Basic Metabolic Panel: Recent Labs  Lab 10/21/21 0052 10/22/21 0520  NA 137 140  K 3.6 3.8  CL 100 109  CO2 28 23  GLUCOSE 223* 182*  BUN 17 14  CREATININE 1.36* 1.07  CALCIUM 9.4 9.0   Liver Function Tests: Recent Labs  Lab 10/21/21 0052 10/22/21 0520  AST 39 26  ALT 60* 42  ALKPHOS 50 42  BILITOT 1.0 2.1*  PROT 7.8 7.0  ALBUMIN 4.2 3.9   CBG: Recent Labs  Lab 10/22/21 1133 10/22/21 1753 10/22/21 2159 10/23/21 0503 10/23/21 0730  GLUCAP 161* 264* 232* 185* 151*    Discharge time spent: greater than 30 minutes.  Signed: Oswald Hillock, MD [facility] 10/23/2021

## 2021-10-26 ENCOUNTER — Encounter: Payer: Self-pay | Admitting: Internal Medicine

## 2021-10-26 ENCOUNTER — Ambulatory Visit (INDEPENDENT_AMBULATORY_CARE_PROVIDER_SITE_OTHER): Payer: Commercial Managed Care - PPO

## 2021-10-26 DIAGNOSIS — R55 Syncope and collapse: Secondary | ICD-10-CM

## 2021-10-26 NOTE — Progress Notes (Unsigned)
Enrolled for [employer] to mail a ZIO XT long term holter monitor to the patients address on file.  

## 2021-10-27 ENCOUNTER — Ambulatory Visit: Payer: Commercial Managed Care - PPO | Admitting: Adult Health

## 2021-10-27 ENCOUNTER — Encounter: Payer: Self-pay | Admitting: Internal Medicine

## 2021-10-28 NOTE — Progress Notes (Unsigned)
Future Appointments  Date Time Provider [place]  10/29/2021 11:00 AM [name], MD [facility] None  11/23/2021  1:40 PM Jerline Pain, MD [facility]  11/04/2022 11:00 AM [name], MD [facility] None    [place]     This very nice 61 y.o.male was admitted to the hospital on  10/21/2021  and patient was discharged from the hospital  6 days ago on  10/23/2021  . The patient now presents for follow up for transition from recent hospitalization.  The day after discharge  our clinical staff contacted the patient to assure stability and schedule a follow up appointment. The discharge summary, medications and diagnostic test results were reviewed before meeting with the patient. The patient was admitted for:   Diagnoses     Syncope   Essential hypertension   Hyperlipidemia associated with type 2 diabetes mellitus ([place])   Type 2 diabetes mellitus with stage 2 chronic kidney disease, without long-term current use of insulin (HCC)   Acute kidney injury ([place])   Acute Gout Flare   Medication management       The patient is a very nice 61 yo single WM who was admitted with Syncope for telemetry monitoring. Chest CTA was negative for PE. Patient improved with IVF & HCTZ was d/c'd at hospital discharge. Cardiology scheduled Zio patch post discharge. Kidney functions improved with IV rehydration.  Patient was also treated at discharge with a Prednisone taper for a Gout Flare.      Hospitalization discharge instructions and medications are reconciled with the patient.      Patient is also followed with Hypertension, Hyperlipidemia, Pre-Diabetes and Vitamin D Deficiency.      Patient is treated for HTN (2001) & BP has been controlled at home. Today's  . Patient has had no complaints of any cardiac type chest pain, palpitations, dyspnea/orthopnea/PND, dizziness, claudication, or dependent edema.     Hyperlipidemia is not controlled with diet & off  Rosuvastatin. Last Lipids were not at goal :   Lab Results  Component Value Date   CHOL 205 (H) 05/05/2021   HDL 46 05/05/2021   LDLCALC 132 (H) 05/05/2021   TRIG 156 (H) 05/05/2021   CHOLHDL 4.5 05/05/2021      Also, the patient has history of T2_NIDDM  (A1c 7.1% /2017) w/CKD2  (GFR 60)  and has had no symptoms of reactive hypoglycemia, diabetic polys, paresthesias or visual blurring.  Last A1c was not at goal:  Lab Results  Component Value Date   HGBA1C 9.0 (H) 10/21/2021      Further, the patient also has history of Vitamin D Deficiency ("20" /2008) and supplements vitamin D without any suspected side-effects. Last vitamin D was not at goal  (70-100) :  Lab Results  Component Value Date   VD25OH 54 08/12/2020   Current Outpatient Medications on File Prior to Visit  Medication Sig   aspirin 81 MG tablet Take  daily   VITAMIN D  Take 1 capsule daily.   glimepiride  2 MG tablet Take 1 tablet daily with breakfast.   ibuprofen  200 MG tablet Take 200 mg by mouth daily as needed (pain).   metFORMIN -XR 500 MG 2 TAKE 2 TABLETS 1 TIME A DAY    olmesartan 20 MG tablet Take 1 tablet daily.   pantoprazole (PROTONIX) 40 MG tablet Take 1 tablet daily.    (Patient not taking: Reported on 07/07/2021)   Zinc 50 MG TABS Take 1 tablet Daily  Allergies  Allergen Reactions   Codeine Itching   Nsaids Other (See Comments)    Bleeding ulcer 2013   PMHx:   Past Medical History:  Diagnosis Date   Allergy    seasonal   Arthritis    Blood transfusion 09/2011   2 units   Diabetes mellitus without complication ([place])    History of adenomatous polyp of colon 10/07/2019   Due 10/03/2022 per Dr. Henrene Pastor   Hyperlipidemia    Hypertension    Renal disorder    Stomach ulcer    Immunization History  Administered Date(s) Administered   PPD Test 01/02/2014, 03/11/2015, 03/31/2016, 05/03/2017, 06/04/2018, 07/23/2019, 08/12/2020   Pneumococcal Polysaccharide-23 10/04/2010, 05/03/2017   Tdap  10/10/2011   Past Surgical History:  Procedure Laterality Date   COLONOSCOPY     ESOPHAGOGASTRODUODENOSCOPY  10/26/2011   Procedure: ESOPHAGOGASTRODUODENOSCOPY (EGD);  Surgeon: Irene Shipper, MD;  Location: [facility] ENDOSCOPY;  Service: Endoscopy;  Laterality: N/A;   FRACTURE SURGERY  broken collar bone   POLYPECTOMY     UPPER GASTROINTESTINAL ENDOSCOPY     FHx:    Reviewed / unchanged  SHx:    Reviewed / unchanged  Systems Review:  Constitutional: Denies fever, chills, wt changes, headaches, insomnia, fatigue, night sweats, change in appetite. Eyes: Denies redness, blurred vision, diplopia, discharge, itchy, watery eyes.  ENT: Denies discharge, congestion, post nasal drip, epistaxis, sore throat, earache, hearing loss, dental pain, tinnitus, vertigo, sinus pain, snoring.  CV: Denies chest pain, palpitations, irregular heartbeat, syncope, dyspnea, diaphoresis, orthopnea, PND, claudication or edema. Respiratory: denies cough, dyspnea, DOE, pleurisy, hoarseness, laryngitis, wheezing.  Gastrointestinal: Denies dysphagia, odynophagia, heartburn, reflux, water brash, abdominal pain or cramps, nausea, vomiting, bloating, diarrhea, constipation, hematemesis, melena, hematochezia  or hemorrhoids. Genitourinary: Denies dysuria, frequency, urgency, nocturia, hesitancy, discharge, hematuria or flank pain. Musculoskeletal: Denies arthralgias, myalgias, stiffness, jt. swelling, pain, limping or strain/sprain.  Skin: Denies pruritus, rash, hives, warts, acne, eczema or change in skin lesion(s). Neuro: No weakness, tremor, incoordination, spasms, paresthesia or pain. Psychiatric: Denies confusion, memory loss or sensory loss. Endo: Denies change in weight, skin or hair change.  Heme/Lymph: No excessive bleeding, bruising or enlarged lymph nodes.  Physical Exam  There were no vitals taken for this visit.  Appears well nourished, well groomed  and in no distress.  Eyes: PERRLA, EOMs, conjunctiva no  swelling or erythema. Sinuses: No frontal/maxillary tenderness ENT/Mouth: EAC's clear, TM's nl w/o erythema, bulging. Nares clear w/o erythema, swelling, exudates. Oropharynx clear without erythema or exudates. Oral hygiene is good. Tongue normal, non obstructing. Hearing intact.  Neck: Supple. Thyroid nl. Car 2+/2+ without bruits, nodes or JVD. Chest: Respirations nl with BS clear & equal w/o rales, rhonchi, wheezing or stridor.  Cor: Heart sounds normal w/ regular rate and rhythm without sig. murmurs, gallops, clicks or rubs. Peripheral pulses normal and equal  without edema.  Abdomen: Soft & bowel sounds normal. Non-tender w/o guarding, rebound, hernias, masses or organomegaly.  Lymphatics: Unremarkable.  Musculoskeletal: Full ROM all peripheral extremities, joint stability, 5/5 strength and normal gait.  Skin: Warm, dry without exposed rashes, lesions or ecchymosis apparent.  Neuro: Cranial nerves intact, reflexes equal bilaterally. Sensory-motor testing grossly intact. Tendon reflexes grossly intact.  Pysch: Alert & oriented x 3.  Insight and judgement nl & appropriate. No ideations.        Discussed  regular exercise, BP monitoring, weight control to achieve/maintain BMI less than 25 and discussed meds and SE's. Recommended labs to assess and monitor clinical status with further  disposition pending results of labs. Over 30 minutes of exam, counseling, chart review was performed.   Kirtland Bouchard, MD

## 2021-10-28 NOTE — Patient Instructions (Signed)

## 2021-10-29 ENCOUNTER — Encounter: Payer: Self-pay | Admitting: Internal Medicine

## 2021-10-29 ENCOUNTER — Ambulatory Visit: Payer: Commercial Managed Care - PPO | Admitting: Internal Medicine

## 2021-10-29 VITALS — BP 134/80 | HR 79 | Temp 97.9°F | Resp 16 | Ht 71.5 in | Wt 261.4 lb

## 2021-10-29 DIAGNOSIS — Z79899 Other long term (current) drug therapy: Secondary | ICD-10-CM

## 2021-10-29 DIAGNOSIS — R55 Syncope and collapse: Secondary | ICD-10-CM | POA: Diagnosis not present

## 2021-10-29 DIAGNOSIS — M1000 Idiopathic gout, unspecified site: Secondary | ICD-10-CM

## 2021-10-29 DIAGNOSIS — I10 Essential (primary) hypertension: Secondary | ICD-10-CM

## 2021-10-29 DIAGNOSIS — N182 Chronic kidney disease, stage 2 (mild): Secondary | ICD-10-CM

## 2021-10-29 DIAGNOSIS — J042 Acute laryngotracheitis: Secondary | ICD-10-CM

## 2021-10-29 DIAGNOSIS — N179 Acute kidney failure, unspecified: Secondary | ICD-10-CM

## 2021-10-29 DIAGNOSIS — E1122 Type 2 diabetes mellitus with diabetic chronic kidney disease: Secondary | ICD-10-CM | POA: Diagnosis not present

## 2021-10-29 DIAGNOSIS — E1169 Type 2 diabetes mellitus with other specified complication: Secondary | ICD-10-CM | POA: Diagnosis not present

## 2021-10-29 DIAGNOSIS — E785 Hyperlipidemia, unspecified: Secondary | ICD-10-CM

## 2021-10-29 MED ORDER — BENZONATATE 200 MG PO CAPS: ORAL_CAPSULE | ORAL | 1 refills | Status: DC

## 2021-10-29 MED ORDER — ALLOPURINOL 300 MG PO TABS: ORAL_TABLET | ORAL | 3 refills | Status: DC

## 2021-10-29 MED ORDER — ACETAMINOPHEN-CODEINE 300-30 MG PO TABS: ORAL_TABLET | ORAL | 0 refills | Status: DC

## 2021-10-29 MED ORDER — DEXAMETHASONE 4 MG PO TABS: ORAL_TABLET | ORAL | 0 refills | Status: DC

## 2021-10-30 DIAGNOSIS — R55 Syncope and collapse: Secondary | ICD-10-CM | POA: Diagnosis not present

## 2021-10-30 LAB — LIPID PANEL
Cholesterol: 176 mg/dL (ref <200)
HDL: 40 mg/dL (ref >=40)
LDL Cholesterol (Calc): 103 mg/dL (calc) — ABNORMAL HIGH
Non-HDL Cholesterol (Calc): 136 mg/dL (calc) — ABNORMAL HIGH (ref <130)
Total CHOL/HDL Ratio: 4.4 (calc) (ref <5.0)
Triglycerides: 209 mg/dL — ABNORMAL HIGH (ref <150)

## 2021-10-30 LAB — COMPLETE METABOLIC PANEL WITH GFR
AG Ratio: 1.5 (calc) (ref 1.0–2.5)
ALT: 29 U/L (ref 9–46)
AST: 22 U/L (ref 10–35)
Albumin: 4 g/dL (ref 3.6–5.1)
Alkaline phosphatase (APISO): 46 U/L (ref 35–144)
BUN: 14 mg/dL (ref 7–25)
CO2: 26 mmol/L (ref 20–32)
Calcium: 9.4 mg/dL (ref 8.6–10.3)
Chloride: 104 mmol/L (ref 98–110)
Creat: 0.97 mg/dL (ref 0.70–1.35)
Globulin: 2.6 g/dL (calc) (ref 1.9–3.7)
Glucose, Bld: 134 mg/dL — ABNORMAL HIGH (ref 65–99)
Potassium: 4.3 mmol/L (ref 3.5–5.3)
Sodium: 140 mmol/L (ref 135–146)
Total Bilirubin: 0.6 mg/dL (ref 0.2–1.2)
Total Protein: 6.6 g/dL (ref 6.1–8.1)
eGFR: 89 mL/min/{1.73_m2} (ref >=60)

## 2021-10-30 LAB — CBC WITH DIFFERENTIAL/PLATELET
Absolute Monocytes: 696 cells/uL (ref 200–950)
Basophils Absolute: 48 cells/uL (ref 0–200)
Basophils Relative: 0.6 %
Eosinophils Absolute: 296 cells/uL (ref 15–500)
Eosinophils Relative: 3.7 %
HCT: 43.3 % (ref 38.5–50.0)
Hemoglobin: 14.8 g/dL (ref 13.2–17.1)
Lymphs Abs: 2480 cells/uL (ref 850–3900)
MCH: 31.8 pg (ref 27.0–33.0)
MCHC: 34.2 g/dL (ref 32.0–36.0)
MCV: 93.1 fL (ref 80.0–100.0)
MPV: 9.7 fL (ref 7.5–12.5)
Monocytes Relative: 8.7 %
Neutro Abs: 4480 cells/uL (ref 1500–7800)
Neutrophils Relative %: 56 %
Platelets: 269 10*3/uL (ref 140–400)
RBC: 4.65 10*6/uL (ref 4.20–5.80)
RDW: 12.3 % (ref 11.0–15.0)
Total Lymphocyte: 31 %
WBC: 8 10*3/uL (ref 3.8–10.8)

## 2021-10-30 LAB — URIC ACID: Uric Acid, Serum: 6.6 mg/dL (ref 4.0–8.0)

## 2021-10-30 LAB — MAGNESIUM: Magnesium: 1.9 mg/dL (ref 1.5–2.5)

## 2021-10-30 LAB — TSH: TSH: 3.94 mIU/L (ref 0.40–4.50)

## 2021-10-30 NOTE — Progress Notes (Signed)
<><><><><><><><><><><><><><><><><><><><><><><><><><><><><><><><><> <><><><><><><><><><><><><><><><><><><><><><><><><><><><><><><><><> -   Test results slightly outside the reference range are not unusual. If there is anything important, I will review this with you,  otherwise it is considered normal test values.  If you have further questions,  please do not hesitate to contact me at the office or via My Chart.  <><><><><><><><><><><><><><><><><><><><><><><><><><><><><><><><><> <><><><><><><><><><><><><><><><><><><><><><><><><><><><><><><><><>  -  Total  Chol =      176    - Excellent            (  Ideal  or  Goal is less than 180  !  )  & -  Bad / Dangerous LDL  Chol =   103  -  Elevated              (  Ideal  or  Goal is less than 70  !  )   - LDL Cholesterol is too high - Recommend a stricter low cholesterol diet   - Cholesterol only comes from animal sources  - ie. meat, dairy, egg yolks  - Eat all the vegetables you want.  - Avoid Meat, Avoid Meat,  Avoid Meat - especially Red Meat - Beef AND Pork .  - Avoid cheese & dairy - milk & ice cream.     - Cheese is the most concentrated form of trans-fats which                                                               is the worst thing to clog up our arteries.   - Veggie cheese is OK which can be found in the fresh                           produce section at [employer] or [employer] or [employer] <><><><><><><><><><><><><><><><><><><><><><><><><><><><><><><><><>  - also Triglycerides (    209   ) or fats in blood are too high             (  Ideal or  Goal is less than 150)    - Recommend avoid fried & greasy foods,  sweets / candy,   - Avoid white rice  (brown or wild rice or Quinoa is OK),   - Avoid white potatoes  (sweet potatoes are OK)   - Avoid anything made from white flour  - bagels, doughnuts, rolls, buns, biscuits, white and   wheat breads, pizza crust and traditional  pasta made of white flour & egg  white  - (vegetarian pasta or spinach or wheat pasta is OK).    - Multi-grain bread is OK - like multi-grain flat bread or  sandwich thins.   - Avoid alcohol in excess.   - Exercise is also important. <><><><><><><><><><><><><><><><><><><><><><><><><><><><><><><><><>  - Uric Acid Gout test is Normal / OK  - so probably not a gout attack in foot <><><><><><><><><><><><><><><><><><><><><><><><><><><><><><><><><>  - All Else - CBC - Kidneys - Electrolytes - Liver - Magnesium & Thyroid    - all  Normal / OK <><><><><><><><><><><><><><><><><><><><><><><><><><><><><><><><><> <><><><><><><><><><><><><><><><><><><><><><><><><><><><><><><><><>

## 2021-10-31 ENCOUNTER — Encounter: Payer: Self-pay | Admitting: Internal Medicine

## 2021-11-23 ENCOUNTER — Encounter: Payer: Self-pay | Admitting: Cardiology

## 2021-11-23 ENCOUNTER — Ambulatory Visit: Payer: Commercial Managed Care - PPO | Admitting: Cardiology

## 2021-11-23 DIAGNOSIS — I10 Essential (primary) hypertension: Secondary | ICD-10-CM | POA: Diagnosis not present

## 2021-11-23 DIAGNOSIS — R55 Syncope and collapse: Secondary | ICD-10-CM | POA: Diagnosis not present

## 2021-11-23 NOTE — Assessment & Plan Note (Signed)
Agree with hospital assessment.  Likely vasovagal syncope.  Classic prodrome.  Dehydrated.  Ate [place] food previously, had been on [place].  Acute kidney injury noted, resolved after hydration.  If prodrome occurs again, he knows to sit down or lay down with his feet follow-up.  No signs of myocardial injury.  Excellent echocardiogram.  ZIO monitor was reassuring.  Occasional PVCs and PACs.  No evidence of pauses or atrial fibrillation. Brief atrial tachycardia noted.  This is benign.  Continue to hydrate well.  Please contact us if further assistance is needed.  Coronary arteries show no evidence of calcification.  CT scan personally reviewed and interpreted.  Extensive hospital results reviewed.  Data reviewed.  Lab work reviewed.

## 2021-11-23 NOTE — Assessment & Plan Note (Signed)
Currently well controlled.  Okay to continue with olmesartan 20 mg a day.

## 2022-02-13 ENCOUNTER — Encounter: Payer: Self-pay | Admitting: Internal Medicine

## 2022-02-13 NOTE — Progress Notes (Unsigned)
Annual  Screening/Preventative Visit  & Comprehensive Evaluation & Examination  Future Appointments  Date Time Provider Department  02/14/2022 10:00 AM [name], MD [facility]  02/16/2023 10:00 AM [name], MD [facility]        This very nice 61 y.o. MWM  presents for a Screening /Preventative Visit & comprehensive evaluation and management of multiple medical co-morbidities.  Patient has been followed for HTN, HLD, T2_NIDDM and Vitamin D Deficiency. Patient has hx/o Gout quiescent on his Allopurinol.      HTN predates since 2001 . Patient's BP has not been monitored at home.  Patient has hx/o non-compliance with medications .  Today's BP is  elevated at 150/90 .  Patient denies any cardiac symptoms as chest pain, palpitations, shortness of breath, dizziness or ankle swelling.      Patient's hyperlipidemia is not controlled with diet and medications. Patient denies myalgias or other medication SE's. Last lipids were near goal albeit elevated Trig's :  Lab Results  Component Value Date   CHOL 176 10/29/2021   HDL 40 10/29/2021   LDLCALC 103 (H) 10/29/2021   TRIG 209 (H) 10/29/2021   CHOLHDL 4.4 10/29/2021        Patient is on Metformin & Glipizide for T2_NIDDM (A1c 7.1% /2017) w/CKD2 (GFR 89) and patient denies reactive hypoglycemic symptoms, visual blurring, diabetic polys or paresthesias.   Last A1c was not at goal :  Lab Results  Component Value Date   HGBA1C 9.0 (H) 10/21/2021        Patient has hx/o Testosterone deficiency and was sporadic with his testosterone injections & finally stopped for perceived lack of benefit.        Finally, patient has history of Vitamin D Deficiency ("20" /2008) and last vitamin D was not at goal (70-100):  Lab Results  Component Value Date   VD25OH 54 08/12/2020           Current Outpatient Medications:    allopurinol (ZYLOPRIM) 300 MG tablet, Take 1 tablet Daily to Prevent Gout, Disp: 90 tablet, Rfl: 3   aspirin  81 MG tablet, Take 81 mg by mouth daily. Reported on 09/23/2015, Disp: , Rfl:    Blood Glucose Monitoring Suppl (FREESTYLE LITE) DEVI, 1 Device by Does not apply route 3 (three) times daily., Disp: 1 each, Rfl: 0   Cholecalciferol (VITAMIN D PO), Take 1 capsule by mouth daily., Disp: , Rfl:    glimepiride (AMARYL) 2 MG tablet, Take 1 tablet (2 mg total) by mouth daily with breakfast., Disp: 30 tablet, Rfl: 11   glucose blood (FREESTYLE LITE) test strip, Check blood sugar 1 to 2 times daily. DX-E11.9, Disp: 100 each, Rfl: 12   ibuprofen (ADVIL) 200 MG tablet, Take 200 mg by mouth daily as needed (pain)., Disp: , Rfl:    Lancets (FREESTYLE) lancets, Use as instructed, Disp: 100 each, Rfl: 12   olmesartan (BENICAR) 20 MG tablet, Take 1 tablet (20 mg total) by mouth daily., Disp: 30 tablet, Rfl: 2   OVER THE COUNTER MEDICATION, Take 1 tablet by mouth daily. Focus factor, Disp: , Rfl:    tirzepatide (MOUNJARO) 2.5 MG/0.5ML Pen, Inject 2.5 mg into Skin every 7 days for Diabetes  (Dx: e11.29), Disp: 2 mL, Rfl: 0   Zinc 50 MG TABS, Take 1 tablet Daily (Patient taking differently: Take 50 mg by mouth daily.), Disp:  , Rfl: 0     Allergies  Allergen Reactions   Codeine Nausea And Vomiting   Nsaids Other (See  Comments)    Bleeding ulcer 2013    Past Medical History:  Diagnosis Date   Allergy    seasonal   Arthritis    Blood transfusion 09/2011   2 units   Diabetes mellitus without complication ([place])    History of adenomatous polyp of colon 10/07/2019   Due 10/03/2022 per Dr. Henrene Pastor   Hyperlipidemia    Hypertension    Renal disorder    Stomach ulcer     Health Maintenance  Topic Date Due   COVID-19 Vaccine (1) Never done   INFLUENZA VACCINE  Never done   OPHTHALMOLOGY EXAM  06/07/2020   FOOT EXAM  07/21/2020   HEMOGLOBIN A1C  11/09/2020   TETANUS/TDAP  10/09/2021   COLONOSCOPY (Pts 45-31yr Insurance coverage will need to be confirmed)  10/03/2022   PNEUMOCOCCAL POLYSACCHARIDE VACCINE  AGE 30-64 HIGH RISK  Completed   Hepatitis C Screening  Completed   HIV Screening  Completed   HPV VACCINES  Aged Out    Immunization History  Administered Date(s) Administered   PPD Test 01/02/2014, 03/11/2015, 03/31/2016, 05/03/2017, 06/04/2018, 07/23/2019   Pneumococcal Polysaccharide-23 10/04/2010, 05/03/2017   Tdap 10/10/2011    Last Colon - 10/03/2019 - Dr PHenrene Pastorrecc 3 year f/u due May 2024.  Past Surgical History:  Procedure Laterality Date   COLONOSCOPY     ESOPHAGOGASTRODUODENOSCOPY  10/26/2011    JIrene Shipper MD   FRACTURE SURGERY  broken collar bone   POLYPECTOMY     UPPER GASTROINTESTINAL ENDOSCOPY      Family History  Adopted: Yes  Problem Relation Age of Onset   Breast cancer Mother    Colon cancer Neg Hx    Colon polyps Neg Hx    Esophageal cancer Neg Hx    Rectal cancer Neg Hx    Stomach cancer Neg Hx     Social History   Socioeconomic History   Marital status: Married    Spouse name: KMarita [place]  Number of children: 1 son   Occupational History   Sales  Tobacco Use   Smoking status: Never Smoker   Smokeless tobacco: Never Used  Substance and Sexual Activity   Alcohol use: Yes    Alcohol/week: 1.0 standard drink    Types: 1 Cans of beer per week    Comment: occasionally    Drug use: No    ROS Constitutional: Denies fever, chills, weight loss/gain, headaches, insomnia,  night sweats or change in appetite. Does c/o fatigue. Eyes: Denies redness, blurred vision, diplopia, discharge, itchy or watery eyes.  ENT: Denies discharge, congestion, post nasal drip, epistaxis, sore throat, earache, hearing loss, dental pain, Tinnitus, Vertigo, Sinus pain or snoring.  Cardio: Denies chest pain, palpitations, irregular heartbeat, syncope, dyspnea, diaphoresis, orthopnea, PND, claudication or edema Respiratory: denies cough, dyspnea, DOE, pleurisy, hoarseness, laryngitis or wheezing.  Gastrointestinal: Denies dysphagia, heartburn, reflux, water brash, pain,  cramps, nausea, vomiting, bloating, diarrhea, constipation, hematemesis, melena, hematochezia, jaundice or hemorrhoids Genitourinary: Denies dysuria, frequency, urgency, nocturia, hesitancy, discharge, hematuria or flank pain Musculoskeletal: Denies arthralgia, myalgia, stiffness, Jt. Swelling, pain, limp or strain/sprain. Denies Falls. Skin: Denies puritis, rash, hives, warts, acne, eczema or change in skin lesion Neuro: No weakness, tremor, incoordination, spasms, paresthesia or pain Psychiatric: Denies confusion, memory loss or sensory loss. Denies Depression. Endocrine: Denies change in weight, skin, hair change, nocturia, and paresthesia, diabetic polys, visual blurring or hyper / hypo glycemic episodes.  Heme/Lymph: No excessive bleeding, bruising or enlarged lymph nodes.  Physical Exam  BP (Marland Kitchen  150/90   Pulse 78   Temp 97.9 F (36.6 C)   Resp 16   Ht 5' 11.5" (1.816 m)   Wt 257 lb (116.6 kg)   SpO2 98%   BMI 35.34 kg/m   General Appearance: over nourished and well groomed and in no apparent distress.  Eyes: PERRLA, EOMs, conjunctiva no swelling or erythema, normal fundi and vessels. Sinuses: No frontal/maxillary tenderness ENT/Mouth: EACs patent / TMs  nl. Nares clear without erythema, swelling, mucoid exudates. Oral hygiene is good. No erythema, swelling, or exudate. Tongue normal, non-obstructing. Tonsils not swollen or erythematous. Hearing normal.  Neck: Supple, thyroid not palpable. No bruits, nodes or JVD. Respiratory: Respiratory effort normal.  BS equal and clear bilateral without rales, rhonci, wheezing or stridor. Cardio: Heart sounds are normal with regular rate and rhythm and no murmurs, rubs or gallops. Peripheral pulses are normal and equal bilaterally without edema. No aortic or femoral bruits. Chest: symmetric with normal excursions and percussion.  Abdomen: Soft, with Nl bowel sounds. Nontender, no guarding, rebound, hernias, masses, or organomegaly.   Lymphatics: Non tender without lymphadenopathy.  Musculoskeletal: Full ROM all peripheral extremities, joint stability, 5/5 strength, and normal gait. Skin: Warm and dry without rashes, lesions, cyanosis, clubbing or  ecchymosis.  Neuro: Cranial nerves intact, reflexes equal bilaterally. Normal muscle tone, no cerebellar symptoms. Sensation intact.  Pysch: Alert and oriented X 3 with normal affect, insight and judgment appropriate.   Assessment and Plan  1. Annual Preventative/Screening Exam    2. Essential hypertension  - EKG 12-Lead - [place], RETROPERITNL ABD,  LTD - Urinalysis, Routine w reflex microscopic - Microalbumin / creatinine urine ratio - CBC with Differential/Platelet - COMPLETE METABOLIC PANEL WITH GFR - Magnesium - TSH  3. Hyperlipidemia associated with type 2 diabetes mellitus ([place])  - EKG 12-Lead - [place], RETROPERITNL ABD,  LTD - Lipid panel - TSH  4. Type 2 diabetes mellitus with stage 2 chronic kidney     disease, without long-term current use of insulin (HCC)  - EKG 12-Lead - [place], RETROPERITNL ABD,  LTD - Urinalysis, Routine w reflex microscopic - Microalbumin / creatinine urine ratio - HM DIABETES FOOT EXAM - LOW EXTREMITY NEUR EXAM DOCUM - Hemoglobin A1c - Insulin, random  5. Vitamin D deficiency  - VITAMIN D 25 Hydroxy   6. Idiopathic gout  - Uric acid  7. OSA on CPAP   8. Testosterone deficiency  - Testosterone  9. BPH with obstruction/lower urinary tract symptoms  - PSA  10. Class 2 severe obesity due to excess calories with serious  comorbidity and body mass index (BMI) of 38.0 to 38.9 in adult ([place])   11. Screening for colorectal cancer  - POC Hemoccult Bld/Stl   12. Screening examination for pulmonary tuberculosis  - TB Skin Test  13. Prostate cancer screening  - PSA  14. Screening for ischemic heart disease  - EKG 12-Lead  15. FH: hypertension  - EKG 12-Lead - [place], RETROPERITNL ABD,  LTD  16. Screening for AAA  (aortic abdominal aneurysm)  - [place], RETROPERITNL ABD,  LTD  17. Fatigue, unspecified type  - Iron, Total/Total Iron Binding Cap - Vitamin B12 - CBC with Differential/Platelet - TSH  18. Medication management  - Urinalysis, Routine w reflex microscopic - Microalbumin / creatinine urine ratio - Testosterone - Uric acid - CBC with Differential/Platelet - COMPLETE METABOLIC PANEL WITH GFR - Magnesium - Lipid panel - TSH - Hemoglobin A1c - Insulin, random - VITAMIN D 25 Hydroxy  Patient was counseled in prudent diet, weight control to achieve/maintain BMI less than 25, BP monitoring, regular exercise and medications as discussed.  Discussed med effects and SE's. Routine screening labs and tests as requested with regular follow-up as recommended. Over 40 minutes of exam, counseling, chart review and high complex critical decision making was performed   Kirtland Bouchard, MD

## 2022-02-13 NOTE — Patient Instructions (Signed)

## 2022-02-14 ENCOUNTER — Encounter: Payer: Self-pay | Admitting: Internal Medicine

## 2022-02-14 ENCOUNTER — Ambulatory Visit (INDEPENDENT_AMBULATORY_CARE_PROVIDER_SITE_OTHER): Payer: Commercial Managed Care - PPO | Admitting: Internal Medicine

## 2022-02-14 VITALS — BP 150/90 | HR 78 | Temp 97.9°F | Resp 16 | Ht 71.5 in | Wt 257.0 lb

## 2022-02-14 DIAGNOSIS — E1122 Type 2 diabetes mellitus with diabetic chronic kidney disease: Secondary | ICD-10-CM

## 2022-02-14 DIAGNOSIS — Z125 Encounter for screening for malignant neoplasm of prostate: Secondary | ICD-10-CM

## 2022-02-14 DIAGNOSIS — I10 Essential (primary) hypertension: Secondary | ICD-10-CM

## 2022-02-14 DIAGNOSIS — Z0000 Encounter for general adult medical examination without abnormal findings: Secondary | ICD-10-CM

## 2022-02-14 DIAGNOSIS — G4733 Obstructive sleep apnea (adult) (pediatric): Secondary | ICD-10-CM

## 2022-02-14 DIAGNOSIS — Z1211 Encounter for screening for malignant neoplasm of colon: Secondary | ICD-10-CM

## 2022-02-14 DIAGNOSIS — Z136 Encounter for screening for cardiovascular disorders: Secondary | ICD-10-CM | POA: Diagnosis not present

## 2022-02-14 DIAGNOSIS — E1169 Type 2 diabetes mellitus with other specified complication: Secondary | ICD-10-CM

## 2022-02-14 DIAGNOSIS — M1000 Idiopathic gout, unspecified site: Secondary | ICD-10-CM

## 2022-02-14 DIAGNOSIS — Z0001 Encounter for general adult medical examination with abnormal findings: Secondary | ICD-10-CM

## 2022-02-14 DIAGNOSIS — Z8249 Family history of ischemic heart disease and other diseases of the circulatory system: Secondary | ICD-10-CM

## 2022-02-14 DIAGNOSIS — I700 Atherosclerosis of aorta: Secondary | ICD-10-CM

## 2022-02-14 DIAGNOSIS — R5383 Other fatigue: Secondary | ICD-10-CM

## 2022-02-14 DIAGNOSIS — E559 Vitamin D deficiency, unspecified: Secondary | ICD-10-CM

## 2022-02-14 DIAGNOSIS — E349 Endocrine disorder, unspecified: Secondary | ICD-10-CM

## 2022-02-14 DIAGNOSIS — Z111 Encounter for screening for respiratory tuberculosis: Secondary | ICD-10-CM

## 2022-02-14 DIAGNOSIS — N401 Enlarged prostate with lower urinary tract symptoms: Secondary | ICD-10-CM

## 2022-02-14 DIAGNOSIS — Z79899 Other long term (current) drug therapy: Secondary | ICD-10-CM

## 2022-02-14 MED ORDER — TIRZEPATIDE 2.5 MG/0.5ML ~~LOC~~ SOAJ: SUBCUTANEOUS | 0 refills | Status: DC

## 2022-02-15 ENCOUNTER — Encounter: Payer: Self-pay | Admitting: Internal Medicine

## 2022-02-15 LAB — URINALYSIS, ROUTINE W REFLEX MICROSCOPIC
Bilirubin Urine: NEGATIVE
Hgb urine dipstick: NEGATIVE
Ketones, ur: NEGATIVE
Leukocytes,Ua: NEGATIVE
Nitrite: NEGATIVE
Protein, ur: NEGATIVE
Specific Gravity, Urine: 1.016 (ref 1.001–1.035)
pH: 6.5 (ref 5.0–8.0)

## 2022-02-15 LAB — COMPLETE METABOLIC PANEL WITH GFR
AG Ratio: 1.4 (calc) (ref 1.0–2.5)
ALT: 42 U/L (ref 9–46)
AST: 38 U/L — ABNORMAL HIGH (ref 10–35)
Albumin: 4.2 g/dL (ref 3.6–5.1)
Alkaline phosphatase (APISO): 50 U/L (ref 35–144)
BUN: 18 mg/dL (ref 7–25)
CO2: 31 mmol/L (ref 20–32)
Calcium: 9.6 mg/dL (ref 8.6–10.3)
Chloride: 104 mmol/L (ref 98–110)
Creat: 1.02 mg/dL (ref 0.70–1.35)
Globulin: 2.9 g/dL (calc) (ref 1.9–3.7)
Glucose, Bld: 207 mg/dL — ABNORMAL HIGH (ref 65–99)
Potassium: 4.2 mmol/L (ref 3.5–5.3)
Sodium: 141 mmol/L (ref 135–146)
Total Bilirubin: 0.8 mg/dL (ref 0.2–1.2)
Total Protein: 7.1 g/dL (ref 6.1–8.1)
eGFR: 84 mL/min/{1.73_m2} (ref >=60)

## 2022-02-15 LAB — CBC WITH DIFFERENTIAL/PLATELET
Absolute Monocytes: 431 cells/uL (ref 200–950)
Basophils Absolute: 59 cells/uL (ref 0–200)
Basophils Relative: 1 %
Eosinophils Absolute: 313 cells/uL (ref 15–500)
Eosinophils Relative: 5.3 %
HCT: 47.7 % (ref 38.5–50.0)
Hemoglobin: 16.7 g/dL (ref 13.2–17.1)
Lymphs Abs: 2301 cells/uL (ref 850–3900)
MCH: 32.2 pg (ref 27.0–33.0)
MCHC: 35 g/dL (ref 32.0–36.0)
MCV: 92.1 fL (ref 80.0–100.0)
MPV: 9.9 fL (ref 7.5–12.5)
Monocytes Relative: 7.3 %
Neutro Abs: 2797 cells/uL (ref 1500–7800)
Neutrophils Relative %: 47.4 %
Platelets: 204 10*3/uL (ref 140–400)
RBC: 5.18 10*6/uL (ref 4.20–5.80)
RDW: 11.7 % (ref 11.0–15.0)
Total Lymphocyte: 39 %
WBC: 5.9 10*3/uL (ref 3.8–10.8)

## 2022-02-15 LAB — MICROALBUMIN / CREATININE URINE RATIO
Creatinine, Urine: 65 mg/dL (ref 20–320)
Microalb Creat Ratio: 14 mcg/mg creat (ref <30)
Microalb, Ur: 0.9 mg/dL

## 2022-02-15 LAB — TESTOSTERONE: Testosterone: 369 ng/dL (ref 250–827)

## 2022-02-15 LAB — IRON, TOTAL/TOTAL IRON BINDING CAP
%SAT: 52 % (calc) — ABNORMAL HIGH (ref 20–48)
Iron: 181 ug/dL — ABNORMAL HIGH (ref 50–180)
TIBC: 350 mcg/dL (calc) (ref 250–425)

## 2022-02-15 LAB — MAGNESIUM: Magnesium: 1.8 mg/dL (ref 1.5–2.5)

## 2022-02-15 LAB — HEMOGLOBIN A1C
Hgb A1c MFr Bld: 7.9 % of total Hgb — ABNORMAL HIGH (ref <5.7)
Mean Plasma Glucose: 180 mg/dL
eAG (mmol/L): 10 mmol/L

## 2022-02-15 LAB — PSA: PSA: 0.31 ng/mL (ref <=4.00)

## 2022-02-15 LAB — VITAMIN D 25 HYDROXY (VIT D DEFICIENCY, FRACTURES): Vit D, 25-Hydroxy: 44 ng/mL (ref 30–100)

## 2022-02-15 LAB — LIPID PANEL
Cholesterol: 209 mg/dL — ABNORMAL HIGH (ref <200)
HDL: 40 mg/dL (ref >=40)
LDL Cholesterol (Calc): 129 mg/dL (calc) — ABNORMAL HIGH
Non-HDL Cholesterol (Calc): 169 mg/dL (calc) — ABNORMAL HIGH (ref <130)
Total CHOL/HDL Ratio: 5.2 (calc) — ABNORMAL HIGH (ref <5.0)
Triglycerides: 263 mg/dL — ABNORMAL HIGH (ref <150)

## 2022-02-15 LAB — INSULIN, RANDOM: Insulin: 90.6 u[IU]/mL — ABNORMAL HIGH

## 2022-02-15 LAB — VITAMIN B12: Vitamin B-12: 512 pg/mL (ref 200–1100)

## 2022-02-15 LAB — URIC ACID: Uric Acid, Serum: 5.6 mg/dL (ref 4.0–8.0)

## 2022-02-15 LAB — TSH: TSH: 2.24 mIU/L (ref 0.40–4.50)

## 2022-02-15 NOTE — Progress Notes (Signed)
<><><><><><><><><><><><><><><><><><><><><><><><><><><><><><><><><> <><><><><><><><><><><><><><><><><><><><><><><><><><><><><><><><><> - Test results slightly outside the reference range are not unusual. If there is anything important, I will review this with you,  otherwise it is considered normal test values.  If you have further questions,  please do not hesitate to contact me at the office or via My Chart.  <><><><><><><><><><><><><><><><><><><><><><><><><><><><><><><><><> <><><><><><><><><><><><><><><><><><><><><><><><><><><><><><><><><>  -  Iron Levels & Vitamin B12 levels are Normal & OK   <><><><><><><><><><><><><><><><><><><><><><><><><><><><><><><><><> <><><><><><><><><><><><><><><><><><><><><><><><><><><><><><><><><>  -  A1c = 7.9%  is still way too high -             (  Ideal or Goal is less than 5.7%  !  )    Being diabetic has a  300% increased risk for heart attack,  stroke, cancer, and alzheimer- type vascular dementia.   It is very important that you work harder with diet by  avoiding all foods that are white except chicken,   fish & calliflower.  - Avoid white rice  (brown & wild rice is OK),   - Avoid white potatoes  (sweet potatoes in moderation is OK),   White bread or wheat bread or anything made out of   white flour like bagels, donuts, rolls, buns, biscuits, cakes,  - pastries, cookies, pizza crust, and pasta (made from  white flour & egg whites)   - vegetarian pasta or spinach or wheat pasta is OK.  - Multigrain breads like Arnold's, Pepperidge Farm or   multigrain sandwich thins or high fiber breads like   [place] bread or "Dave's Killer" breads that are  4 to 5 grams fiber per slice !  are best.    Diet, exercise and weight loss can reverse and cure  diabetes in the early stages.    - Diet, exercise and weight loss is very important in the   control and prevention of complications of diabetes which  affects every system in your body, ie.    -Brain - dementia/stroke,  - eyes - glaucoma/blindness,  - heart - heart attack/heart failure,  - kidneys - dialysis,  - stomach - gastric paralysis,  - intestines - malabsorption,  - nerves - severe painful neuritis,  - circulation - gangrene & loss of a leg(s)  - and finally  . . . . . . . . . . . . . . . . . .    - cancer and Alzheimers.   - So , Ultimately who will pay the price for your bad diet & poor Diabetic Control   ?  <><><><><><><><><><><><><><><><><><><><><><><><><><><><><><><><><> <><><><><><><><><><><><><><><><><><><><><><><><><><><><><><><><><>  - Total Chol = 209 is very high risk for Heart Attack /Stroke /Vascular Dementia     ( Ideal or Goal is less than 180 ! )  & - Bad /Dangerous LDL Chol = 129   - - >> Sitting on a time Bomb !     ( Ideal or Goal is less than 70 ! )   - Treating with meds to lower Cholesterol is treating the result                                          & NOT treating the cause - The cause is Bad Diet !  - Read or listen to   Dr Wyman Songster 's book    " How Not to Die ! "   - Recommend a stricter plant based low cholesterol diet   -  Cholesterol only comes from animal sources                                                                    - ie. meat, dairy, egg yolks  - Eat all the vegetables you want.  - Avoid meat,   Avoid Meat,  Avoid Meat                                                               - especially red meat - Beef AND Pork .  - Avoid cheese & dairy - milk & ice cream.    - Cheese is the most concentrated form of trans-fats which                                                             is the worst thing to clog up our arteries.   - Veggie cheese is OK which can be found in the fresh   produce section at [employer] or [employer] or  [employer] <><><><><><><><><><><><><><><><><><><><><><><><><><><><><><><><><> <><><><><><><><><><><><><><><><><><><><><><><><><><><><><><><><><>  - Also , Triglycerides (   263   ) or fats in blood are too high                 (   Ideal or  Goal is less than 150  !  )    - Recommend avoid fried & greasy foods,  sweets / candy,   - Avoid white rice  (brown or wild rice or Quinoa is OK),   - Avoid white potatoes  (sweet potatoes are OK)   - Avoid anything made from white flour  - bagels, doughnuts, rolls, buns, biscuits, white and   wheat breads, pizza crust and traditional  pasta made of white flour & egg white  - (vegetarian pasta or spinach or wheat pasta is OK).    - Multi-grain bread is OK - like multi-grain flat bread or  sandwich thins.   - Avoid alcohol in excess.   - Exercise is also important.  <><><><><><><><><><><><><><><><><><><><><><><><><><><><><><><><><> <><><><><><><><><><><><><><><><><><><><><><><><><><><><><><><><><>  -  PSA - is Low - Great ! <><><><><><><><><><><><><><><><><><><><><><><><><><><><><><><><><> <><><><><><><><><><><><><><><><><><><><><><><><><><><><><><><><><>  -  Testosterone is back Normal - So please continue your Zinc supplement <><><><><><><><><><><><><><><><><><><><><><><><><><><><><><><><><> <><><><><><><><><><><><><><><><><><><><><><><><><><><><><><><><><>  -  Uric acid / Gout test is normal -  so continue your Allopurinol  <><><><><><><><><><><><><><><><><><><><><><><><><><><><><><><><><> <><><><><><><><><><><><><><><><><><><><><><><><><><><><><><><><><>  -   Magnesium  -   1.8  -  very  low- goal is betw 2.0 - 2.5,   - So..............Marland Kitchen  Recommend that you take                                       Magnesium 500 mg tablet  2 x /day   - also important to eat lots of  leafy green vegetables   -  spinach - Kale - collards - greens - okra - asparagus  - broccoli - quinoa - squash - almonds   - black, red, white beans  -   peas - green beans  <><><><><><><><><><><><><><><><><><><><><><><><><><><><><><><><><> <><><><><><><><><><><><><><><><><><><><><><><><><><><><><><><><><>  -  Vitamin DE = 44 - Very low   - Vitamin D goal is between 70-100.   - Please make sure that you are taking your Vitamin D as directed.   - It is very important as a natural anti-inflammatory and helping the  immune system protect against viral infections, like the Covid-19    helping hair, skin, and nails, as well as reducing stroke and  heart attack risk.   - It helps your bones and helps with mood.  - It also decreases numerous cancer risks so please  take it as directed.   - Low Vit D is associated with a 200-300% higher risk for  CANCER   and 200-300% higher risk for HEART   ATTACK  &  STROKE.    - It is also associated with higher death rate at younger ages,   autoimmune diseases like Rheumatoid arthritis, Lupus,  Multiple Sclerosis.     - Also many other serious conditions, like depression, Alzheimer's  Dementia, infertility, muscle aches, fatigue, fibromyalgia   <><><><><><><><><><><><><><><><><><><><><><><><><><><><><><><><><> <><><><><><><><><><><><><><><><><><><><><><><><><><><><><><><><><>  -  All Else - CBC - Kidneys - Electrolytes - Liver - Magnesium & Thyroid    - all  Normal / OK <><><><><><><><><><><><><><><><><><><><><><><><><><><><><><><><><> <><><><><><><><><><><><><><><><><><><><><><><><><><><><><><><><><>

## 2022-02-15 NOTE — Telephone Encounter (Signed)
Can you enter this

## 2022-05-17 ENCOUNTER — Ambulatory Visit: Payer: Commercial Managed Care - PPO | Admitting: Nurse Practitioner

## 2022-05-25 NOTE — Progress Notes (Signed)
FOLLOW UP  Assessment and Plan:   Hypertension Elevated; reports improved med compliance and was controlled at home No more redbulls BP safe alternatives suggested Monitor blood pressure at home; patient to call if consistently greater than 140/80 and will increase benicar dose Continue DASH diet.   Reminder to go to the ER if any CP, SOB, nausea, dizziness, severe HA, changes vision/speech, left arm numbness and tingling and jaw pain. Follow up recheck 2 weeks with home BP log  Hyperlipidemia associated with Type 2 Diabetes Mellitus(HCC) Currently at goal; continue lipitor 40 mg Continue low cholesterol diet and exercise.  Check lipid panel.   Type 2 diabetes mellitus with Stage 2 CKD(HCC) Taking glimepiride, not taking Metformin Restarting Mounjaro at 7.5 mg and will titrate back up as tolerated Continue diet and exercise.  Perform daily foot/skin check, notify office of any concerning changes.  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) Increase fluids, avoid NSAIDS, monitor sugars, will monitor - CBC, CMP  Obesity Restarting Mounjaro at 7.5 mg and will titrate back up as tolerated Long discussion about weight loss, diet, and exercise Recommended diet heavy in fruits and veggies and low in animal meats, cheeses, and dairy products, appropriate calorie intake Discussed ideal weight for height Doing well on mounjaro; increase to 7.5 mg weekly at next refill Add exercise Will follow up in 3 months  Vitamin D Def At goal, continue supplementation Goal of 60-100 Defer checking vitamin D level  Gout He reports improved with lifestyle, not taking allopurinol  Diet discussed Check uric acid as needed  Medication Management - CBC, CMP  Continue diet and meds as discussed. Further disposition pending results of labs. Discussed med's effects and SE's.   Over 30 minutes of exam, counseling, chart review, and critical decision making was performed.   Future  Appointments  Date Time Provider [place]  08/16/2022  9:30 AM [name], MD [facility] None  02/16/2023 10:00 AM [name], MD [facility] None    ----------------------------------------------------------------------------------------------------------------------  HPI 61 y.o. male  presents for 3 month follow up on hypertension, cholesterol, diabetes with CKD, morbid obesity, gout and vitamin D deficiency.   Recovering from URI after conference, was started on steroid taper, zpak, tessalon and improving.   BMI is Body mass index is 35.81 kg/m., he is working on diet right now, active on his job. Has cut out soda, doing mostly unsweet tea. Has cut portions, will not finish his plate. Avoiding fries.  Wt Readings from Last 3 Encounters:  05/26/22 260 lb 6.4 oz (118.1 kg)  02/14/22 257 lb (116.6 kg)  11/23/21 261 lb (118.4 kg)   He reports when he takes meds BP at home is good (can't recall numbers), admits he took sudafed this AM, today their BP is BP: (!) 140/90,  BP Readings from Last 3 Encounters:  05/26/22 (!) 140/90  02/14/22 (!) 150/90  11/23/21 130/70  He reports has picked up pill dispenser to help with compliance/consistency and doing much better with this  He does not workout. He denies chest pain, shortness of breath, dizziness.   He has been working on diet and exercise for T2 diabetes CKD stage 2 he is on benicar 20 mg QD Hyperlipidemia rosuvastatin 10 mg, at LDL goal  Metformin not currently taking  glipizide 5 mg - taking 1 tab daily with breakfast,  Was started on Mounjaro 12.5 but was stopped but willing to restart denies foot ulcerations, hyperglycemia, increased appetite, nausea, paresthesia of the feet,  polydipsia, polyuria, visual disturbances, vomiting and weight loss.  He has not been checking his blood sugars Last A1C in the office was:  Lab Results  Component Value Date   HGBA1C 7.9 (H) 02/14/2022   He has CKD stage 2 associated  with T2DM monitored at this office, on olmesartan:  Lab Results  Component Value Date   EGFR 84 02/14/2022    Lab Results  Component Value Date   CHOL 209 (H) 02/14/2022   HDL 40 02/14/2022   LDLCALC 129 (H) 02/14/2022   TRIG 263 (H) 02/14/2022   CHOLHDL 5.2 (H) 02/14/2022   Patient is on Vitamin D supplement and at goal at last check:   Lab Results  Component Value Date   VD25OH 44 02/14/2022     Patient is prescribed allopurinol for gout but doesn't take, and does not report a recent flare. No flares since cutting out drinks.  Lab Results  Component Value Date   LABURIC 5.6 02/14/2022   He has a history of testosterone deficiency and is on zinc supplement, has declined supplement. No debilitating sx.  Lab Results  Component Value Date   TESTOSTERONE 369 02/14/2022   Hx of LFT elevations, did have negative hepatitis panel in 2015, recent normal iron in 07/2019, fatty liver per abd [place] 11/2019.  Lab Results  Component Value Date   ALT 42 02/14/2022   AST 38 (H) 02/14/2022   ALKPHOS 42 10/22/2021   BILITOT 0.8 02/14/2022      Current Medications:  Current Outpatient Medications on File Prior to Visit  Medication Sig   allopurinol (ZYLOPRIM) 300 MG tablet Take 1 tablet Daily to Prevent Gout   aspirin 81 MG tablet Take 81 mg by mouth daily. Reported on 09/23/2015   Blood Glucose Monitoring Suppl (FREESTYLE LITE) DEVI 1 Device by Does not apply route 3 (three) times daily.   Cholecalciferol (VITAMIN D PO) Take 1 capsule by mouth daily.   glimepiride (AMARYL) 2 MG tablet Take 1 tablet (2 mg total) by mouth daily with breakfast.   glucose blood (FREESTYLE LITE) test strip Check blood sugar 1 to 2 times daily. [id number]   ibuprofen (ADVIL) 200 MG tablet Take 200 mg by mouth daily as needed (pain).   Lancets (FREESTYLE) lancets Use as instructed   MAGNESIUM PO Take by mouth.   olmesartan (BENICAR) 20 MG tablet Take 1 tablet (20 mg total) by mouth daily.   Zinc 50 MG TABS Take  1 tablet Daily (Patient taking differently: Take 50 mg by mouth daily.)   OVER THE COUNTER MEDICATION Take 1 tablet by mouth daily. Focus factor (Patient not taking: Reported on 05/26/2022)   No current facility-administered medications on file prior to visit.     Allergies:  Allergies  Allergen Reactions   Codeine Itching   Nsaids Other (See Comments)    Bleeding ulcer 2013     Medical History:  Past Medical History:  Diagnosis Date   Allergy    seasonal   Arthritis    Blood transfusion 09/2011   2 units   Diabetes mellitus without complication ([place])    History of adenomatous polyp of colon 10/07/2019   Due 10/03/2022 per Dr. Henrene Pastor   Hyperlipidemia    Hypertension    Renal disorder    Stomach ulcer    Syncope    Family history- Reviewed and unchanged Social history- Reviewed and unchanged   Review of Systems:  Review of Systems  Constitutional:  Negative for malaise/fatigue and weight loss.  HENT:  Negative for congestion, hearing loss and tinnitus.   Eyes:  Negative for blurred vision and double vision.  Respiratory:  Negative for cough, sputum production, shortness of breath and wheezing.   Cardiovascular:  Negative for chest pain, palpitations, orthopnea, claudication and leg swelling.  Gastrointestinal:  Negative for abdominal pain, blood in stool, constipation, diarrhea, heartburn, melena, nausea and vomiting.  Genitourinary: Negative.   Musculoskeletal:  Negative for joint pain and myalgias.  Skin:  Negative for rash.  Neurological:  Negative for dizziness, tingling, sensory change, weakness and headaches.  Endo/Heme/Allergies:  Negative for environmental allergies and polydipsia.  Psychiatric/Behavioral: Negative.    All other systems reviewed and are negative.    Physical Exam: BP (!) 140/90   Pulse 76   Temp (!) 97.5 F (36.4 C)   Ht 5' 11.5" (1.816 m)   Wt 260 lb 6.4 oz (118.1 kg)   SpO2 96%   BMI 35.81 kg/m  Wt Readings from Last 3 Encounters:   05/26/22 260 lb 6.4 oz (118.1 kg)  02/14/22 257 lb (116.6 kg)  11/23/21 261 lb (118.4 kg)   General Appearance: Well nourished, in no apparent distress. Eyes: PERRLA, EOMs, conjunctiva no swelling or erythema Sinuses: No Frontal/maxillary tenderness ENT/Mouth: Ext aud canals clear, TMs without erythema, bulging. No erythema, swelling, or exudate on post pharynx.  Tonsils not swollen or erythematous. Hearing normal.  Neck: Supple, thyroid normal.  Respiratory: Respiratory effort normal, BS equal bilaterally without rales, rhonchi, wheezing or stridor.  Cardio: RRR with no MRGs. Brisk peripheral pulses without edema.  Abdomen: Soft, + BS.  Non tender, no guarding, rebound, hernias, masses. Lymphatics: Non tender without lymphadenopathy.  Musculoskeletal: Full ROM, 5/5 strength, Normal gait Skin: Warm, dry without rashes, lesions, ecchymosis.  Neuro: Cranial nerves intact. No cerebellar symptoms.  Psych: Awake and oriented X 3, normal affect, Insight and Judgment appropriate.    Alycia Rossetti, NP 12:04 PM [facility]

## 2022-05-26 ENCOUNTER — Encounter: Payer: Self-pay | Admitting: Nurse Practitioner

## 2022-05-26 ENCOUNTER — Ambulatory Visit (INDEPENDENT_AMBULATORY_CARE_PROVIDER_SITE_OTHER): Payer: Commercial Managed Care - PPO | Admitting: Nurse Practitioner

## 2022-05-26 VITALS — BP 140/90 | HR 76 | Temp 97.5°F | Ht 71.5 in | Wt 260.4 lb

## 2022-05-26 DIAGNOSIS — E1122 Type 2 diabetes mellitus with diabetic chronic kidney disease: Secondary | ICD-10-CM | POA: Diagnosis not present

## 2022-05-26 DIAGNOSIS — M1000 Idiopathic gout, unspecified site: Secondary | ICD-10-CM

## 2022-05-26 DIAGNOSIS — E785 Hyperlipidemia, unspecified: Secondary | ICD-10-CM

## 2022-05-26 DIAGNOSIS — E1169 Type 2 diabetes mellitus with other specified complication: Secondary | ICD-10-CM

## 2022-05-26 DIAGNOSIS — I10 Essential (primary) hypertension: Secondary | ICD-10-CM | POA: Diagnosis not present

## 2022-05-26 DIAGNOSIS — E66812 Obesity, class 2: Secondary | ICD-10-CM

## 2022-05-26 DIAGNOSIS — Z6838 Body mass index (BMI) 38.0-38.9, adult: Secondary | ICD-10-CM

## 2022-05-26 DIAGNOSIS — Z79899 Other long term (current) drug therapy: Secondary | ICD-10-CM

## 2022-05-26 DIAGNOSIS — E559 Vitamin D deficiency, unspecified: Secondary | ICD-10-CM

## 2022-05-26 DIAGNOSIS — R7989 Other specified abnormal findings of blood chemistry: Secondary | ICD-10-CM

## 2022-05-26 DIAGNOSIS — N182 Chronic kidney disease, stage 2 (mild): Secondary | ICD-10-CM

## 2022-05-26 MED ORDER — ROSUVASTATIN CALCIUM 10 MG PO TABS: ORAL_TABLET | ORAL | 3 refills | Status: DC

## 2022-05-26 MED ORDER — MOUNJARO 7.5 MG/0.5ML ~~LOC~~ SOAJ: 7.5000 mg | SUBCUTANEOUS | 3 refills | Status: DC

## 2022-05-26 NOTE — Patient Instructions (Signed)
Please check blood sugars regularly Restart Mounjaro 7.5 mg weekly  Diabetes Mellitus and Nutrition, Adult When you have diabetes, or diabetes mellitus, it is very important to have healthy eating habits because your blood sugar (glucose) levels are greatly affected by what you eat and drink. Eating healthy foods in the right amounts, at about the same times every day, can help you: Manage your blood glucose. Lower your risk of heart disease. Improve your blood pressure. Reach or maintain a healthy weight. What can affect my meal plan? Every person with diabetes is different, and each person has different needs for a meal plan. Your health care provider may recommend that you work with a dietitian to make a meal plan that is best for you. Your meal plan may vary depending on factors such as: The calories you need. The medicines you take. Your weight. Your blood glucose, blood pressure, and cholesterol levels. Your activity level. Other health conditions you have, such as heart or kidney disease. How do carbohydrates affect me? Carbohydrates, also called carbs, affect your blood glucose level more than any other type of food. Eating carbs raises the amount of glucose in your blood. It is important to know how many carbs you can safely have in each meal. This is different for every person. Your dietitian can help you calculate how many carbs you should have at each meal and for each snack. How does alcohol affect me? Alcohol can cause a decrease in blood glucose (hypoglycemia), especially if you use insulin or take certain diabetes medicines by mouth. Hypoglycemia can be a life-threatening condition. Symptoms of hypoglycemia, such as sleepiness, dizziness, and confusion, are similar to symptoms of having too much alcohol. Do not drink alcohol if: Your health care provider tells you not to drink. You are pregnant, may be pregnant, or are planning to become pregnant. If you drink alcohol: Limit  how much you have to: 0-1 drink a day for women. 0-2 drinks a day for men. Know how much alcohol is in your drink. In the [place] one drink equals one 12 oz bottle of beer (355 mL), one 5 oz glass of wine (148 mL), or one 1 oz glass of hard liquor (44 mL). Keep yourself hydrated with water, diet soda, or unsweetened iced tea. Keep in mind that regular soda, juice, and other mixers may contain a lot of sugar and must be counted as carbs. What are tips for following this plan?  Reading food labels Start by checking the serving size on the Nutrition Facts label of packaged foods and drinks. The number of calories and the amount of carbs, fats, and other nutrients listed on the label are based on one serving of the item. Many items contain more than one serving per package. Check the total grams (g) of carbs in one serving. Check the number of grams of saturated fats and trans fats in one serving. Choose foods that have a low amount or none of these fats. Check the number of milligrams (mg) of salt (sodium) in one serving. Most people should limit total sodium intake to less than 2,300 mg per day. Always check the nutrition information of foods labeled as "low-fat" or "nonfat." These foods may be higher in added sugar or refined carbs and should be avoided. Talk to your dietitian to identify your daily goals for nutrients listed on the label. Shopping Avoid buying canned, pre-made, or processed foods. These foods tend to be high in fat, sodium, and added sugar. Shop around  the outside edge of the grocery store. This is where you will most often find fresh fruits and vegetables, bulk grains, fresh meats, and fresh dairy products. Cooking Use low-heat cooking methods, such as baking, instead of high-heat cooking methods, such as deep frying. Cook using healthy oils, such as olive, canola, or sunflower oil. Avoid cooking with butter, cream, or high-fat meats. Meal planning Eat meals and snacks  regularly, preferably at the same times every day. Avoid going long periods of time without eating. Eat foods that are high in fiber, such as fresh fruits, vegetables, beans, and whole grains. Eat 4-6 oz (112-168 g) of lean protein each day, such as lean meat, chicken, fish, eggs, or tofu. One ounce (oz) (28 g) of lean protein is equal to: 1 oz (28 g) of meat, chicken, or fish. 1 egg.  cup (62 g) of tofu. Eat some foods each day that contain healthy fats, such as avocado, nuts, seeds, and fish. What foods should I eat? Fruits Berries. Apples. Oranges. Peaches. Apricots. Plums. Grapes. Mangoes. Papayas. Pomegranates. Kiwi. Cherries. Vegetables Leafy greens, including lettuce, spinach, kale, chard, collard greens, mustard greens, and cabbage. Beets. Cauliflower. Broccoli. Carrots. Green beans. Tomatoes. Peppers. Onions. Cucumbers. Brussels sprouts. Grains Whole grains, such as whole-wheat or whole-grain bread, crackers, tortillas, cereal, and pasta. Unsweetened oatmeal. Quinoa. Brown or wild rice. Meats and other proteins Seafood. Poultry without skin. Lean cuts of poultry and beef. Tofu. Nuts. Seeds. Dairy Low-fat or fat-free dairy products such as milk, yogurt, and cheese. The items listed above may not be a complete list of foods and beverages you can eat and drink. Contact a dietitian for more information. What foods should I avoid? Fruits Fruits canned with syrup. Vegetables Canned vegetables. Frozen vegetables with butter or cream sauce. Grains Refined white flour and flour products such as bread, pasta, snack foods, and cereals. Avoid all processed foods. Meats and other proteins Fatty cuts of meat. Poultry with skin. Breaded or fried meats. Processed meat. Avoid saturated fats. Dairy Full-fat yogurt, cheese, or milk. Beverages Sweetened drinks, such as soda or iced tea. The items listed above may not be a complete list of foods and beverages you should avoid. Contact a  dietitian for more information. Questions to ask a health care provider Do I need to meet with a certified diabetes care and education specialist? Do I need to meet with a dietitian? What number can I call if I have questions? When are the best times to check my blood glucose? Where to find more information: American Diabetes Association: diabetes.org Academy of Nutrition and Dietetics: eatright.[employer] of Diabetes and Digestive and Kidney Diseases: [employer] Association of Diabetes Care & Education Specialists: diabeteseducator.org Summary It is important to have healthy eating habits because your blood sugar (glucose) levels are greatly affected by what you eat and drink. It is important to use alcohol carefully. A healthy meal plan will help you manage your blood glucose and lower your risk of heart disease. Your health care provider may recommend that you work with a dietitian to make a meal plan that is best for you. This information is not intended to replace advice given to you by your health care provider. Make sure you discuss any questions you have with your health care provider. Document Revised: 12/18/2019 Document Reviewed: 12/18/2019 Elsevier Patient Education  [place].

## 2022-05-27 ENCOUNTER — Telehealth: Payer: Self-pay

## 2022-05-27 ENCOUNTER — Encounter: Payer: Self-pay | Admitting: Internal Medicine

## 2022-05-27 LAB — COMPLETE METABOLIC PANEL WITH GFR
AG Ratio: 1.4 (calc) (ref 1.0–2.5)
ALT: 48 U/L — ABNORMAL HIGH (ref 9–46)
AST: 38 U/L — ABNORMAL HIGH (ref 10–35)
Albumin: 4.3 g/dL (ref 3.6–5.1)
Alkaline phosphatase (APISO): 60 U/L (ref 35–144)
BUN: 15 mg/dL (ref 7–25)
CO2: 30 mmol/L (ref 20–32)
Calcium: 9.7 mg/dL (ref 8.6–10.3)
Chloride: 102 mmol/L (ref 98–110)
Creat: 1.09 mg/dL (ref 0.70–1.35)
Globulin: 3.1 g/dL (calc) (ref 1.9–3.7)
Glucose, Bld: 279 mg/dL — ABNORMAL HIGH (ref 65–99)
Potassium: 4.9 mmol/L (ref 3.5–5.3)
Sodium: 140 mmol/L (ref 135–146)
Total Bilirubin: 0.8 mg/dL (ref 0.2–1.2)
Total Protein: 7.4 g/dL (ref 6.1–8.1)
eGFR: 77 mL/min/{1.73_m2} (ref >=60)

## 2022-05-27 LAB — CBC WITH DIFFERENTIAL/PLATELET
Absolute Monocytes: 562 cells/uL (ref 200–950)
Basophils Absolute: 59 cells/uL (ref 0–200)
Basophils Relative: 0.8 %
Eosinophils Absolute: 385 cells/uL (ref 15–500)
Eosinophils Relative: 5.2 %
HCT: 48 % (ref 38.5–50.0)
Hemoglobin: 16.9 g/dL (ref 13.2–17.1)
Lymphs Abs: 2842 cells/uL (ref 850–3900)
MCH: 32.1 pg (ref 27.0–33.0)
MCHC: 35.2 g/dL (ref 32.0–36.0)
MCV: 91.3 fL (ref 80.0–100.0)
MPV: 10.3 fL (ref 7.5–12.5)
Monocytes Relative: 7.6 %
Neutro Abs: 3552 cells/uL (ref 1500–7800)
Neutrophils Relative %: 48 %
Platelets: 196 10*3/uL (ref 140–400)
RBC: 5.26 10*6/uL (ref 4.20–5.80)
RDW: 12 % (ref 11.0–15.0)
Total Lymphocyte: 38.4 %
WBC: 7.4 10*3/uL (ref 3.8–10.8)

## 2022-05-27 LAB — LIPID PANEL
Cholesterol: 161 mg/dL (ref <200)
HDL: 44 mg/dL (ref >=40)
LDL Cholesterol (Calc): 82 mg/dL (calc)
Non-HDL Cholesterol (Calc): 117 mg/dL (calc) (ref <130)
Total CHOL/HDL Ratio: 3.7 (calc) (ref <5.0)
Triglycerides: 269 mg/dL — ABNORMAL HIGH (ref <150)

## 2022-05-27 LAB — HEMOGLOBIN A1C
Hgb A1c MFr Bld: 9.6 % of total Hgb — ABNORMAL HIGH (ref <5.7)
Mean Plasma Glucose: 229 mg/dL
eAG (mmol/L): 12.7 mmol/L

## 2022-05-27 NOTE — Telephone Encounter (Signed)
Mounjaro prior auth completed and submitted.   Prior auth approved 05/27/23.

## 2022-06-04 ENCOUNTER — Other Ambulatory Visit: Payer: Self-pay | Admitting: Internal Medicine

## 2022-06-04 ENCOUNTER — Encounter: Payer: Self-pay | Admitting: Internal Medicine

## 2022-06-04 DIAGNOSIS — M1000 Idiopathic gout, unspecified site: Secondary | ICD-10-CM

## 2022-06-04 MED ORDER — COLCHICINE 0.6 MG PO TABS: ORAL_TABLET | ORAL | 0 refills | Status: DC

## 2022-06-10 ENCOUNTER — Other Ambulatory Visit: Payer: Self-pay | Admitting: Internal Medicine

## 2022-06-10 DIAGNOSIS — E1122 Type 2 diabetes mellitus with diabetic chronic kidney disease: Secondary | ICD-10-CM

## 2022-06-10 MED ORDER — GLIPIZIDE 5 MG PO TABS: ORAL_TABLET | ORAL | 11 refills | Status: AC

## 2022-06-10 MED ORDER — METFORMIN HCL ER 500 MG PO TB24: ORAL_TABLET | ORAL | 3 refills | Status: DC

## 2022-06-10 MED ORDER — DEXAMETHASONE 4 MG PO TABS: ORAL_TABLET | ORAL | 0 refills | Status: DC

## 2022-07-29 NOTE — Progress Notes (Unsigned)
Assessment and Plan:  There are no diagnoses linked to this encounter.    Further disposition pending results of labs. Discussed med's effects and SE's.   Over 30 minutes of exam, counseling, chart review, and critical decision making was performed.   Future Appointments  Date Time Provider [place]  08/01/2022  9:30 AM Alycia Rossetti, NP [facility] None  08/29/2022  9:30 AM [name], MD [facility] None  02/16/2023 10:00 AM [name], MD [facility] None    ------------------------------------------------------------------------------------------------------------------   HPI There were no vitals taken for this visit. 62 y.o.male presents for  Past Medical History:  Diagnosis Date   Allergy    seasonal   Arthritis    Blood transfusion 09/2011   2 units   Diabetes mellitus without complication ([place])    History of adenomatous polyp of colon 10/07/2019   Due 10/03/2022 per Dr. Henrene Pastor   Hyperlipidemia    Hypertension    Renal disorder    Stomach ulcer    Syncope      Allergies  Allergen Reactions   Codeine Itching   Nsaids Other (See Comments)    Bleeding ulcer 2013    Current Outpatient Medications on File Prior to Visit  Medication Sig   allopurinol (ZYLOPRIM) 300 MG tablet Take 1 tablet Daily to Prevent Gout   aspirin 81 MG tablet Take 81 mg by mouth daily. Reported on 09/23/2015   Blood Glucose Monitoring Suppl (FREESTYLE LITE) DEVI 1 Device by Does not apply route 3 (three) times daily.   Cholecalciferol (VITAMIN D PO) Take 1 capsule by mouth daily.   dexamethasone (DECADRON) 4 MG tablet Take 1 tab 3 x /day for 2 days,      then 2 x /day for 2  Days,     then 1 tab daily   glipiZIDE (GLUCOTROL) 5 MG tablet Take  1/2 to 1 tablet  2 x /day  with Lunch & Supper  if Glucose is over 200 mg%   glucose blood (FREESTYLE LITE) test strip Check blood sugar 1 to 2 times daily. DX-E11.9   ibuprofen (ADVIL) 200 MG tablet Take 200 mg by mouth daily as  needed (pain).   Lancets (FREESTYLE) lancets Use as instructed   MAGNESIUM PO Take by mouth.   metFORMIN (GLUCOPHAGE-XR) 500 MG 24 hr tablet TAKE 2 TABLETS 2 TIMES A DAY WITH MEALS FOR DIABETES   olmesartan (BENICAR) 20 MG tablet Take 1 tablet (20 mg total) by mouth daily.   rosuvastatin (CRESTOR) 10 MG tablet Take  1 tablet  Daily  for Cholesterol   tirzepatide (MOUNJARO) 7.5 MG/0.5ML Pen Inject 7.5 mg into the skin once a week.   Zinc 50 MG TABS Take 1 tablet Daily (Patient taking differently: Take 50 mg by mouth daily.)   No current facility-administered medications on file prior to visit.    ROS: all negative except above.   Physical Exam:  There were no vitals taken for this visit.  General Appearance: Well nourished, in no apparent distress. Eyes: PERRLA, EOMs, conjunctiva no swelling or erythema Sinuses: No Frontal/maxillary tenderness ENT/Mouth: Ext aud canals clear, TMs without erythema, bulging. No erythema, swelling, or exudate on post pharynx.  Tonsils not swollen or erythematous. Hearing normal.  Neck: Supple, thyroid normal.  Respiratory: Respiratory effort normal, BS equal bilaterally without rales, rhonchi, wheezing or stridor.  Cardio: RRR with no MRGs. Brisk peripheral pulses without edema.  Abdomen: Soft, + BS.  Non tender, no guarding, rebound, hernias, masses. Lymphatics: Non tender without  lymphadenopathy.  Musculoskeletal: Full ROM, 5/5 strength, normal gait.  Skin: Warm, dry without rashes, lesions, ecchymosis.  Neuro: Cranial nerves intact. Normal muscle tone, no cerebellar symptoms. Sensation intact.  Psych: Awake and oriented X 3, normal affect, Insight and Judgment appropriate.     Alycia Rossetti, NP 8:35 AM [facility]

## 2022-08-01 ENCOUNTER — Encounter: Payer: Self-pay | Admitting: Nurse Practitioner

## 2022-08-01 ENCOUNTER — Ambulatory Visit: Payer: Commercial Managed Care - PPO | Admitting: Nurse Practitioner

## 2022-08-01 ENCOUNTER — Ambulatory Visit
Admission: RE | Admit: 2022-08-01 | Discharge: 2022-08-01 | Disposition: A | Discharge status: Home / Self Care | Payer: Commercial Managed Care - PPO | Source: Ambulatory Visit | Attending: Nurse Practitioner | Admitting: Nurse Practitioner

## 2022-08-01 VITALS — BP 130/88 | HR 89 | Temp 98.1°F | Ht 71.5 in | Wt 260.4 lb

## 2022-08-01 DIAGNOSIS — M25561 Pain in right knee: Secondary | ICD-10-CM

## 2022-08-01 DIAGNOSIS — I10 Essential (primary) hypertension: Secondary | ICD-10-CM

## 2022-08-01 MED ORDER — CYCLOBENZAPRINE HCL 5 MG PO TABS: 5.0000 mg | ORAL_TABLET | Freq: Three times a day (TID) | ORAL | 0 refills | Status: DC | PRN

## 2022-08-01 NOTE — Patient Instructions (Signed)
Right knee xray has been ordered at [facility] [place] M-F 8:30-3:45

## 2022-08-01 NOTE — Addendum Note (Signed)
Addended by: Lorenda Peck E on: 08/01/2022 09:29 AM   Modules accepted: Orders

## 2022-08-16 ENCOUNTER — Ambulatory Visit: Payer: Commercial Managed Care - PPO | Admitting: Internal Medicine

## 2022-08-17 ENCOUNTER — Other Ambulatory Visit: Payer: Self-pay | Admitting: Nurse Practitioner

## 2022-08-17 DIAGNOSIS — M25561 Pain in right knee: Secondary | ICD-10-CM

## 2022-08-28 ENCOUNTER — Encounter: Payer: Self-pay | Admitting: Internal Medicine

## 2022-08-28 DIAGNOSIS — N3281 Overactive bladder: Secondary | ICD-10-CM

## 2022-08-28 MED ORDER — MIRABEGRON ER 25 MG PO TB24: ORAL_TABLET | ORAL | 0 refills | Status: DC

## 2022-08-28 NOTE — Progress Notes (Unsigned)
Future Appointments  Date Time Provider Department  08/29/2022  9:30 AM [name], MD [facility]  02/16/2023 10:00 AM [name], MD [facility]    History of Present Illness:           This very nice 62 y.o. MWM presents for 6  month follow up with HTN, HLD,  T2_NIDDM and Vitamin D Deficiency.  Patient  also has hx/o Gout and has sporadically taken his Allopurinol.         Patient is treated for HTN  (2001) & BP has been controlled at home. Today's BP is elevated at 154/80. Patient has had no complaints of any cardiac type chest pain, palpitations, dyspnea Vertell Limber /PND, dizziness, claudication or dependent edema.        Hyperlipidemia is controlled with diet & Rosuvastatin. Patient denies myalgias or other med SE's. Last Lipids were at goal except elevated  Trig's :  Lab Results  Component Value Date   CHOL 161 05/26/2022   HDL 44 05/26/2022   LDLCALC 82 05/26/2022   TRIG 269 (H) 05/26/2022   CHOLHDL 3.7 05/26/2022      Also, the patient has history of T2_NIDDM (A1c 7.1% /2017) w/CKD2 (GFR 89) and  is on Metformin , Glipizide & Mounjaro ( states he's been off of his Mounjaro for 3 weeks ) .  He has had no symptoms of reactive hypoglycemia, diabetic polys, paresthesias or visual blurring.  Last A1c was not at goal  & he was started on Mounjaro :  Lab Results  Component Value Date   HGBA1C 9.6 (H) 05/26/2022   Wt Readings from Last 3 Encounters:  08/29/22 259 lb 3.2 oz (117.6 kg)  08/01/22 260 lb 6.4 oz (118.1 kg)  05/26/22 260 lb 6.4 oz (118.1 kg)        Further, the patient also has history of Vitamin D Deficiency and supplements vitamin D . Last vitamin D was low & dose was recommended increased :  Lab Results  Component Value Date   VD25OH 44 02/14/2022     Current Outpatient Medications on File Prior to Visit  Medication Sig   allopurinol (ZYLOPRIM) 300 MG tablet Take 1 tablet Daily to Prevent Gout   Cholecalciferol (VITAMIN D PO) Take 1  capsule by mouth daily.   cyclobenzaprine (FLEXERIL) 5 MG tablet Take 1 tablet (5 mg total) by mouth 3 (three) times daily as needed for muscle spasms.   glipiZIDE (GLUCOTROL) 5 MG tablet Take  1/2 to 1 tablet  2 x /day  with Lunch & Supper  if Glucose is over 200 mg%   ibuprofen (ADVIL) 200 MG tablet Take 200 mg by mouth daily as needed (pain).   MAGNESIUM PO Take by mouth.   MYRBETRIQ  25 MG  Take 1 tablet  Daily for Bladder control   olmesartan (BENICAR) 20 MG tablet Take 1 tablet (20 mg total) by mouth daily.   rosuvastatin (CRESTOR) 10 MG tablet Take  1 tablet  Daily  for Cholesterol   tirzepatide (MOUNJARO) 7.5 MG/0.5ML Pen Inject 7.5 mg into the skin once a week.   Zinc 50 MG TABS Take 1 tablet Daily      Allergies  Allergen Reactions   Codeine Itching   Nsaids Other (See Comments)    Bleeding ulcer 2013     PMHx:   Past Medical History:  Diagnosis Date   Allergy    seasonal   Arthritis    Blood transfusion 09/2011   2 units  Diabetes mellitus without complication ([place])    History of adenomatous polyp of colon 10/07/2019   Due 10/03/2022 per Dr. Henrene Pastor   Hyperlipidemia    Hypertension    Renal disorder    Stomach ulcer    Syncope      Immunization History  Administered Date(s) Administered   PPD Test 07/23/2019, 08/12/2020, 02/14/2022   Pneumococcal -23 10/04/2010, 05/03/2017   Tdap 10/10/2011      Past Surgical History:  Procedure Laterality Date   COLONOSCOPY     ESOPHAGOGASTRODUODENOSCOPY  10/26/2011   Procedure: ESOPHAGOGASTRODUODENOSCOPY (EGD);  Surgeon: Irene Shipper, MD;  Location: [facility] ENDOSCOPY;  Service: Endoscopy;  Laterality: N/A;   FRACTURE SURGERY  broken collar bone   POLYPECTOMY     UPPER GASTROINTESTINAL ENDOSCOPY       FHx:    Reviewed / unchanged   SHx:    Reviewed / unchanged    Systems Review:  Constitutional: Denies fever, chills, wt changes, headaches, insomnia, fatigue, night sweats, change in appetite. Eyes: Denies  redness, blurred vision, diplopia, discharge, itchy, watery eyes.  ENT: Denies discharge, congestion, post nasal drip, epistaxis, sore throat, earache, hearing loss, dental pain, tinnitus, vertigo, sinus pain, snoring.  CV: Denies chest pain, palpitations, irregular heartbeat, syncope, dyspnea, diaphoresis, orthopnea, PND, claudication or edema. Respiratory: denies cough, dyspnea, DOE, pleurisy, hoarseness, laryngitis, wheezing.  Gastrointestinal: Denies dysphagia, odynophagia, heartburn, reflux, water brash, abdominal pain or cramps, nausea, vomiting, bloating, diarrhea, constipation, hematemesis, melena, hematochezia  or hemorrhoids. Genitourinary: Denies dysuria, frequency, urgency, nocturia, hesitancy, discharge, hematuria or flank pain. Musculoskeletal: Denies arthralgias, myalgias, stiffness, jt. swelling, pain, limping or strain/sprain.  Skin: Denies pruritus, rash, hives, warts, acne, eczema or change in skin lesion(s). Neuro: No weakness, tremor, incoordination, spasms, paresthesia or pain. Psychiatric: Denies confusion, memory loss or sensory loss. Endo: Denies change in weight, skin or hair change.  Heme/Lymph: No excessive bleeding, bruising or enlarged lymph nodes.   Physical Exam  BP (!) 154/80   Pulse 87   Temp 97.9 F (36.6 C)   Resp 16   Ht 5' 11.5" (1.816 m)   Wt 259 lb 3.2 oz (117.6 kg)   SpO2 96%   BMI 35.65 kg/m   Appears  well nourished, well groomed  and in no distress.  Eyes: PERRLA, EOMs, conjunctiva no swelling or erythema. Sinuses: No frontal/maxillary tenderness ENT/Mouth: EAC's clear, TM's nl w/o erythema, bulging. Nares clear w/o erythema, swelling, exudates. Oropharynx clear without erythema or exudates. Oral hygiene is good. Tongue normal, non obstructing. Hearing intact.  Neck: Supple. Thyroid not palpable. Car 2+/2+ without bruits, nodes or JVD. Chest: Respirations nl with BS clear & equal w/o rales, rhonchi, wheezing or stridor.  Cor: Heart sounds  normal w/ regular rate and rhythm without sig. murmurs, gallops, clicks or rubs. Peripheral pulses normal and equal  without edema.  Abdomen: Soft & bowel sounds normal. Non-tender w/o guarding, rebound, hernias, masses or organomegaly.  Lymphatics: Unremarkable.  Musculoskeletal: Full ROM all peripheral extremities, joint stability, 5/5 strength and normal gait.  Skin: Warm, dry without exposed rashes, lesions or ecchymosis apparent.  Neuro: Cranial nerves intact, reflexes equal bilaterally. Sensory-motor testing grossly intact. Tendon reflexes grossly intact.  Pysch: Alert & oriented x 3.  Insight and judgement nl & appropriate. No ideations.   Assessment and Plan:   1. Essential hypertension  - Continue medication, monitor blood pressure at home.  - Continue DASH diet.  Reminder to go to the ER if any CP,  SOB, nausea, dizziness, severe HA,  changes vision/speech.   - CBC with Differential/Platelet - COMPLETE METABOLIC PANEL WITH GFR - Magnesium - TSH  2. Hyperlipidemia associated with type 2 diabetes mellitus  - Continue diet/meds, exercise,& lifestyle modifications.  - Continue monitor periodic cholesterol/liver & renal functions    - Lipid panel - TSH  3. Type 2 diabetes mellitus with stage 2 chronic kidney  disease, without long-term current use of insulin    - Encouraged to Restart his Metformin and also advised  to call different drug stores for availability of Mounjaro.     - Continue diet, exercise  - Lifestyle modifications.  - Monitor appropriate labs  - Hemoglobin A1c - Insulin, random  4. Vitamin D deficiency  - Continue supplementation    - VITAMIN D 25 Hydroxy   5. Idiopathic gout  - Uric acid  6. Medication management  - CBC with Differential/Platelet - COMPLETE METABOLIC PANEL WITH GFR - Magnesium - Lipid panel - TSH - Hemoglobin A1c - Insulin, random - VITAMIN D 25 Hydroxy  - Uric acid       Discussed  regular exercise, BP  monitoring, weight control to achieve/maintain BMI less than 25 and discussed med and SE's. Recommended labs to assess /monitor clinical status .  I discussed the assessment and treatment plan with the patient. The patient was provided an opportunity to ask questions and all were answered. The patient agreed with the plan and demonstrated an understanding of the instructions.  I provided over 30 minutes of exam, counseling, chart review and  complex critical decision making.        The patient was advised to call back or seek an in-person evaluation if the symptoms worsen or if the condition fails to improve as anticipated.   Kirtland Bouchard, MD

## 2022-08-28 NOTE — Patient Instructions (Signed)

## 2022-08-29 ENCOUNTER — Encounter: Payer: Self-pay | Admitting: Internal Medicine

## 2022-08-29 ENCOUNTER — Ambulatory Visit: Payer: Commercial Managed Care - PPO | Admitting: Internal Medicine

## 2022-08-29 DIAGNOSIS — E559 Vitamin D deficiency, unspecified: Secondary | ICD-10-CM | POA: Diagnosis not present

## 2022-08-29 DIAGNOSIS — I10 Essential (primary) hypertension: Secondary | ICD-10-CM | POA: Diagnosis not present

## 2022-08-29 DIAGNOSIS — N182 Chronic kidney disease, stage 2 (mild): Secondary | ICD-10-CM

## 2022-08-29 DIAGNOSIS — E1169 Type 2 diabetes mellitus with other specified complication: Secondary | ICD-10-CM | POA: Diagnosis not present

## 2022-08-29 DIAGNOSIS — M1000 Idiopathic gout, unspecified site: Secondary | ICD-10-CM

## 2022-08-29 DIAGNOSIS — E1122 Type 2 diabetes mellitus with diabetic chronic kidney disease: Secondary | ICD-10-CM | POA: Diagnosis not present

## 2022-08-29 DIAGNOSIS — Z79899 Other long term (current) drug therapy: Secondary | ICD-10-CM

## 2022-08-29 DIAGNOSIS — E785 Hyperlipidemia, unspecified: Secondary | ICD-10-CM

## 2022-08-30 LAB — CBC WITH DIFFERENTIAL/PLATELET
Absolute Monocytes: 428 cells/uL (ref 200–950)
Basophils Absolute: 69 cells/uL (ref 0–200)
Basophils Relative: 1.1 %
Eosinophils Absolute: 277 cells/uL (ref 15–500)
Eosinophils Relative: 4.4 %
HCT: 49.5 % (ref 38.5–50.0)
Hemoglobin: 16.6 g/dL (ref 13.2–17.1)
Lymphs Abs: 2507.4 cells/uL (ref 850–3900)
MCH: 31.1 pg (ref 27.0–33.0)
MCHC: 33.5 g/dL (ref 32.0–36.0)
MCV: 92.9 fL (ref 80.0–100.0)
MPV: 10.1 fL (ref 7.5–12.5)
Monocytes Relative: 6.8 %
Neutro Abs: 3018 cells/uL (ref 1500–7800)
Neutrophils Relative %: 47.9 %
Platelets: 227 10*3/uL (ref 140–400)
RBC: 5.33 10*6/uL (ref 4.20–5.80)
RDW: 11.8 % (ref 11.0–15.0)
Total Lymphocyte: 39.8 %
WBC: 6.3 10*3/uL (ref 3.8–10.8)

## 2022-08-30 LAB — COMPLETE METABOLIC PANEL WITH GFR
AG Ratio: 1.4 (calc) (ref 1.0–2.5)
ALT: 43 U/L (ref 9–46)
AST: 27 U/L (ref 10–35)
Albumin: 4.4 g/dL (ref 3.6–5.1)
Alkaline phosphatase (APISO): 69 U/L (ref 35–144)
BUN: 16 mg/dL (ref 7–25)
CO2: 27 mmol/L (ref 20–32)
Calcium: 9.6 mg/dL (ref 8.6–10.3)
Chloride: 100 mmol/L (ref 98–110)
Creat: 1.06 mg/dL (ref 0.70–1.35)
Globulin: 3.1 g/dL (calc) (ref 1.9–3.7)
Glucose, Bld: 343 mg/dL — ABNORMAL HIGH (ref 65–99)
Potassium: 4.2 mmol/L (ref 3.5–5.3)
Sodium: 136 mmol/L (ref 135–146)
Total Bilirubin: 0.7 mg/dL (ref 0.2–1.2)
Total Protein: 7.5 g/dL (ref 6.1–8.1)
eGFR: 80 mL/min/{1.73_m2} (ref >=60)

## 2022-08-30 LAB — LIPID PANEL
Cholesterol: 204 mg/dL — ABNORMAL HIGH (ref <200)
HDL: 38 mg/dL — ABNORMAL LOW (ref >=40)
LDL Cholesterol (Calc): 112 mg/dL (calc) — ABNORMAL HIGH
Non-HDL Cholesterol (Calc): 166 mg/dL (calc) — ABNORMAL HIGH (ref <130)
Total CHOL/HDL Ratio: 5.4 (calc) — ABNORMAL HIGH (ref <5.0)
Triglycerides: 378 mg/dL — ABNORMAL HIGH (ref <150)

## 2022-08-30 LAB — TSH: TSH: 2.68 mIU/L (ref 0.40–4.50)

## 2022-08-30 LAB — HEMOGLOBIN A1C
Hgb A1c MFr Bld: 9.1 % of total Hgb — ABNORMAL HIGH (ref <5.7)
Mean Plasma Glucose: 214 mg/dL
eAG (mmol/L): 11.9 mmol/L

## 2022-08-30 LAB — URIC ACID: Uric Acid, Serum: 7.8 mg/dL (ref 4.0–8.0)

## 2022-08-30 LAB — MAGNESIUM: Magnesium: 1.9 mg/dL (ref 1.5–2.5)

## 2022-08-30 LAB — VITAMIN D 25 HYDROXY (VIT D DEFICIENCY, FRACTURES): Vit D, 25-Hydroxy: 37 ng/mL (ref 30–100)

## 2022-08-30 LAB — INSULIN, RANDOM: Insulin: 20.9 u[IU]/mL — ABNORMAL HIGH

## 2022-08-30 NOTE — Progress Notes (Signed)
<><><><><><><><><><><><><><><><><><><><><><><><><><><><><><><><><> <><><><><><><><><><><><><><><><><><><><><><><><><><><><><><><><><> - Test results slightly outside the reference range are not unusual. If there is anything important, I will review this with you,  otherwise it is considered normal test values.  If you have further questions,  please do not hesitate to contact me at the office or via My Chart.  <><><><><><><><><><><><><><><><><><><><><><><><><><><><><><><><><> <><><><><><><><><><><><><><><><><><><><><><><><><><><><><><><><><>  -  Glucose = 343 mg% is Dangerously High  !   - A1c = 9.1% is also Way Way too High ! ! !   - Please ReStart your Metformin !   - Also Please restart your Mounjaro <><><><><><><><><><><><><><><><><><><><><><><><><><><><><><><><><>  -    Your blood sugar and A1c are very high risk !   Being diabetic has a  300% increased risk for heart attack,                                                                                          stroke,                                                                                                cancer,                                                                                                         And                                                                                    alzheimer- type vascular dementia.     It is very important that you work harder with diet by                                     avoiding all foods that are white except chicken, fish & calliflower.  - Avoid white rice  (brown & wild rice is OK),   - Avoid white potatoes  (sweet potatoes in moderation is OK),   White bread or wheat bread  or anything made out of   white flour like bagels, donuts, rolls, buns, biscuits, cakes,  - pastries, cookies, pizza crust, and pasta (made from  white flour & egg whites)   - vegetarian pasta or spinach or wheat pasta is OK.  - Multigrain breads like Arnold's, Pepper[employer]  [employer] or   multigrain sandwich thins or high fiber breads like   [place] bread or "Dave's Killer" breads that are  4 to 5 grams fiber per slice !  are best.      Diet, exercise and weight loss can reverse and cure  diabetes in the early stages.     - Diet, exercise and weight loss is very important in the                                               control and prevention of complications of diabetes which   affects every system in your body, ie.   -Brain - dementia /stroke,  - eyes - glaucoma /blindness,  - heart - heart attack /heart failure,  - kidneys - dialysis,  - stomach - gastric paralysis,  - intestines - malabsorption,  - nerves - severe painful neuritis,  - circulation - gangrene & loss of a leg(s)  - and finally  . . . . . . . . . . . . . . . . . .    - cancer and Alzheimers. <><><><><><><><><><><><><><><><><><><><><><><><><><><><><><><><><> <><><><><><><><><><><><><><><><><><><><><><><><><><><><><><><><><>  -  Uric Acid ( Gout Test is OK  ( Barely in safe range ) <><><><><><><><><><><><><><><><><><><><><><><><><><><><><><><><><>  -  Vitamin D = 37 is Dangerously LOW  !  - Vitamin D goal is between 70-100.   xz.xz.xz.xz.xz.xz.xz.xz.xz.xz.xz.xz.xz.xz.xz.xz.xz.xz.xz.xz.xz.xz.xz.xz.xz.xz.xz.xz.xz.xz.  - Please TAKE Vitamin D 5,000 unit caps  x 2 capsules = 10,000 units EVERY day.    xz.xz.xz.xz.xz.xz.xz.xz.xz.xz.xz.xz.xz.xz.xz.xz.xz.xz.xz.xz.xz.xz.xz.xz.xz.xz.xz.xz.xz.xz.  - It is very important as a natural anti-inflammatory and helping the                                 immune system protect against viral infections, like the Covid-19    helping hair, skin, and nails, as well as reducing stroke and heart attack risk.   - It helps your bones and helps with mood.  - It also decreases numerous cancer risks so please                                                                                       take it as directed.   - Low Vit D is associated with  a 200-300% higher risk for   CANCER   and 200-300% higher risk for HEART   ATTACK  &  STROKE.    - It is also associated with higher death rate at younger ages,   autoimmune diseases like Rheumatoid arthritis, Lupus,  Multiple Sclerosis.     - Also many other serious conditions, like depression, Alzheimer's Dementia,  muscle aches, fatigue, fibromyalgia   <><><><><><><><><><><><><><><><><><><><><><><><><><><><><><><><><> <><><><><><><><><><><><><><><><><><><><><><><><><><><><><><><><><>  -  All Else - CBC - Kidneys - Electrolytes - Liver - Magnesium & Thyroid    - all  Normal / OK <><><><><><><><><><><><><><><><><><><><><><><><><><><><><><><><><> <><><><><><><><><><><><><><><><><><><><><><><><><><><><><><><><><>

## 2022-09-26 ENCOUNTER — Encounter: Payer: Self-pay | Admitting: Internal Medicine

## 2022-10-07 ENCOUNTER — Encounter: Payer: Self-pay | Admitting: Internal Medicine

## 2022-10-08 ENCOUNTER — Other Ambulatory Visit: Payer: Self-pay | Admitting: Internal Medicine

## 2022-10-08 DIAGNOSIS — E1122 Type 2 diabetes mellitus with diabetic chronic kidney disease: Secondary | ICD-10-CM

## 2022-10-08 MED ORDER — TIRZEPATIDE 12.5 MG/0.5ML ~~LOC~~ SOAJ: SUBCUTANEOUS | 0 refills | Status: DC

## 2022-10-10 IMAGING — DX DG FOOT COMPLETE 3+V*R*
3 series · 3 of 3 positions shown · non-contrast
Comparison: None.

CLINICAL DATA: Right foot pain.

EXAM:
RIGHT FOOT COMPLETE - 3+ VIEW

[foot ap]
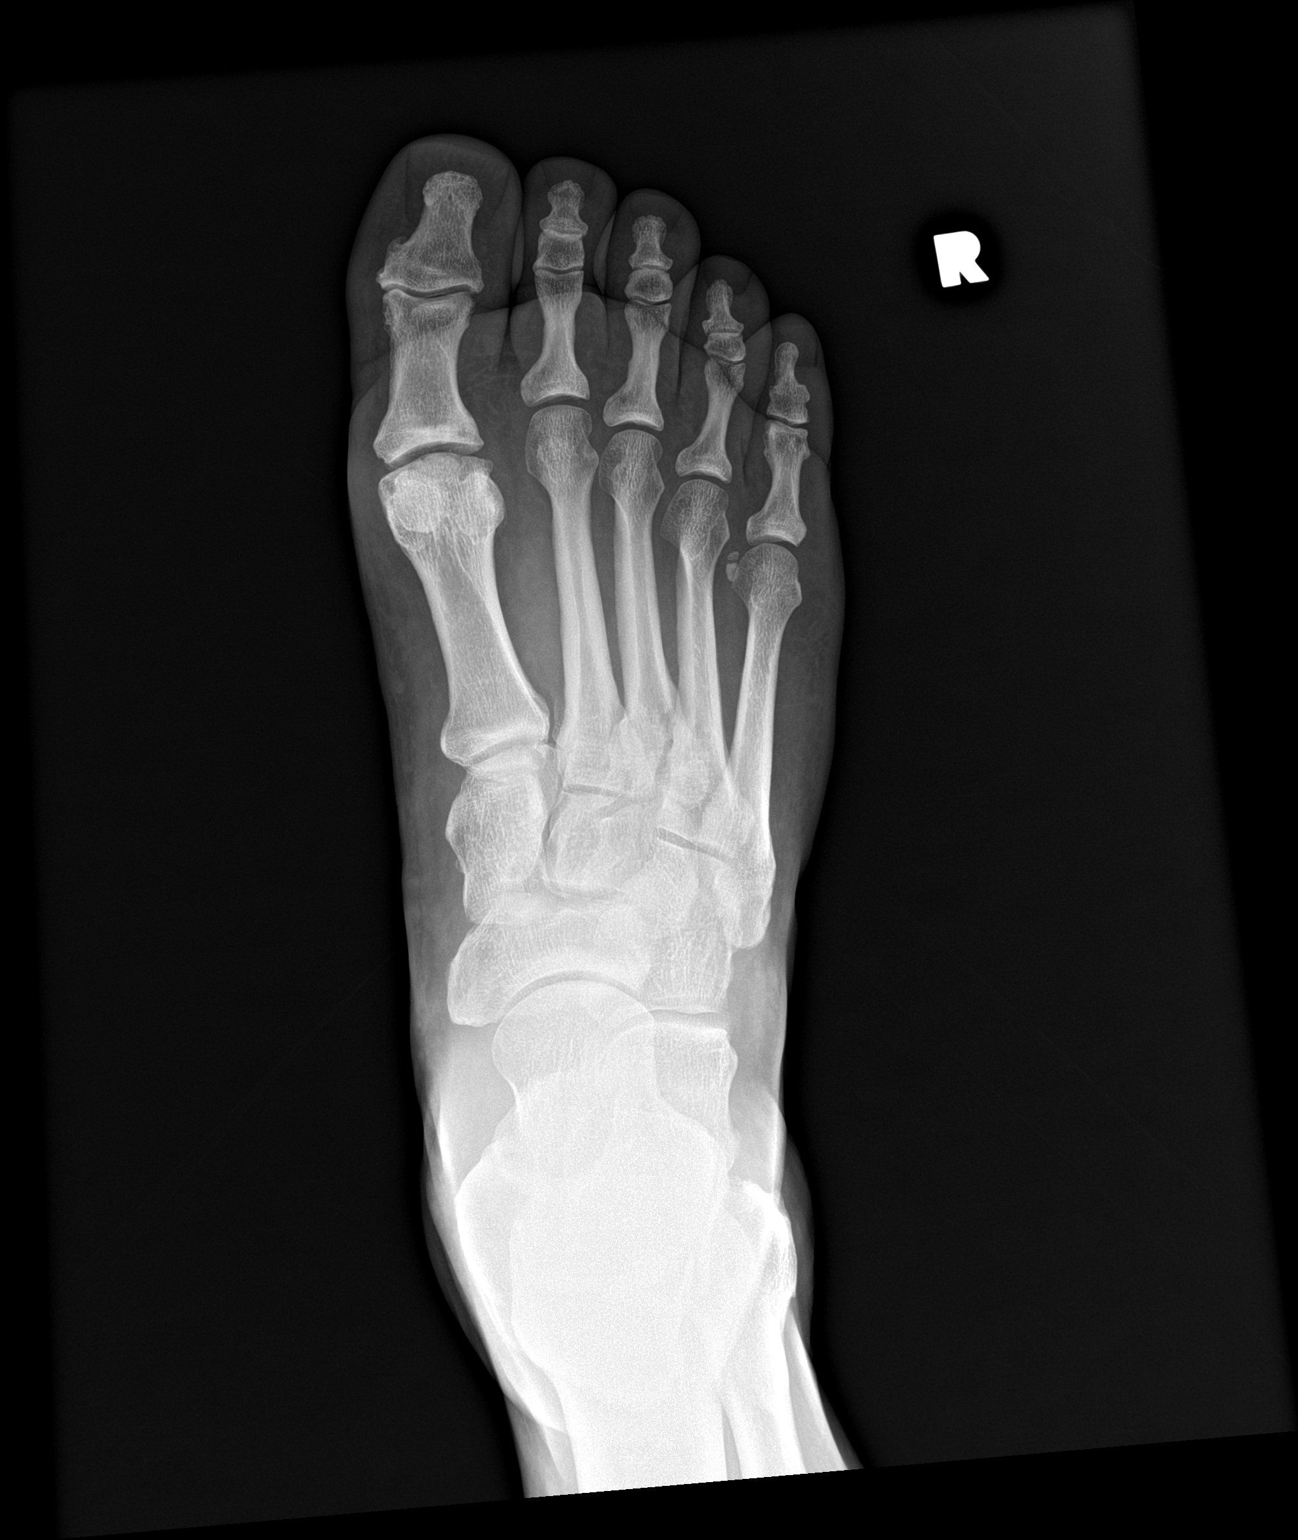

[foot obl]
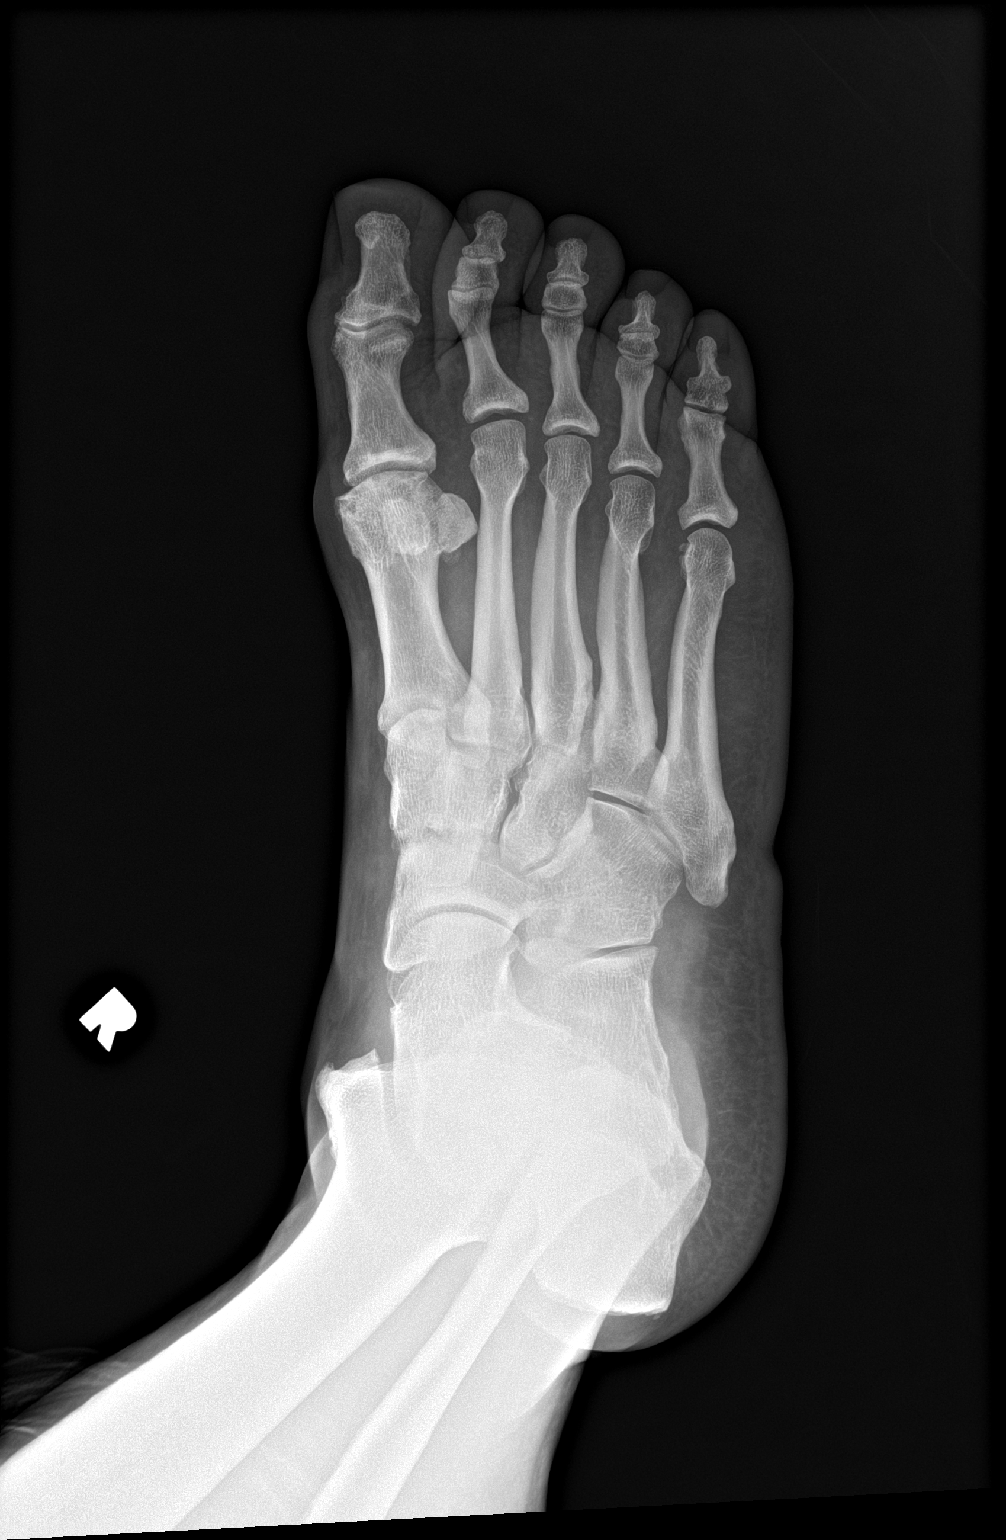

[foot lat]
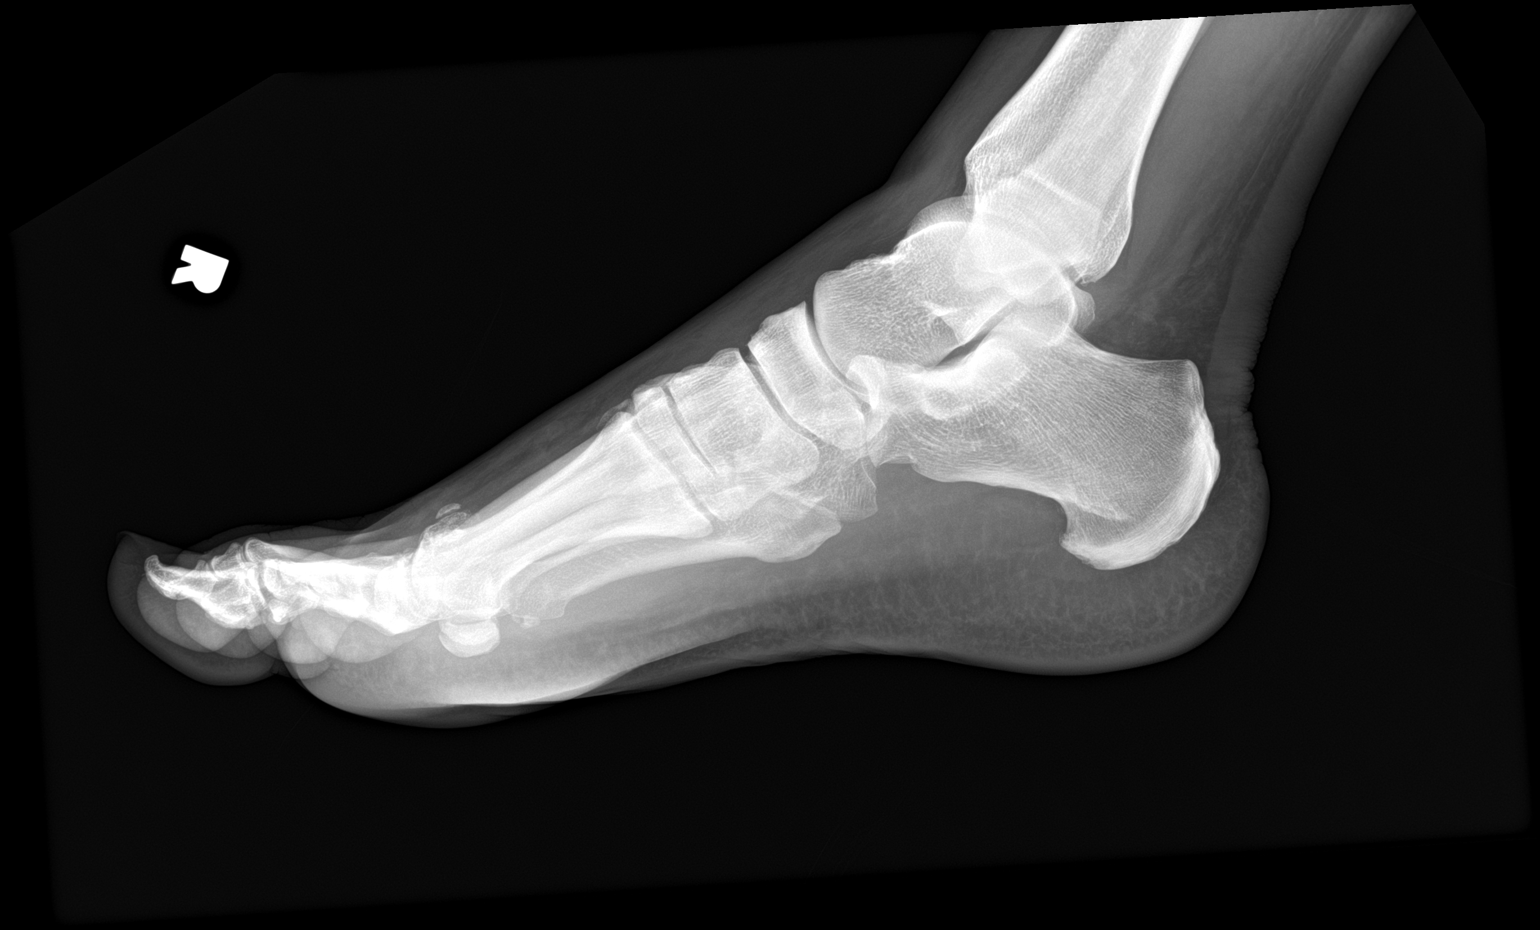

[3 of 3 positions shown; findings below may reference images not displayed]

FINDINGS: No acute fracture or dislocation. The bones are well mineralized. No
significant arthritic changes. The soft tissues are unremarkable.
IMPRESSION: Negative.

## 2022-10-12 ENCOUNTER — Encounter: Payer: Self-pay | Admitting: Internal Medicine

## 2022-10-20 NOTE — Progress Notes (Signed)
Future Appointments  Date Time Provider Department  10/21/2022 10:30 AM Lucky Cowboy, MD [facility]  12/15/2022  9:30 AM Raynelle Dick, NP [facility]  03/27/2023 11:00 AM Lucky Cowboy, MD [facility]    History of Present Illness:       This very nice 62 y.o. MWM  with HTN, HLD,  T2_NIDDM,  hx/o Gout and Vitamin D Deficiency  who presents with  2-3 month hx/o knee pains . Denies injury.   Current Outpatient Medications on File Prior to Visit  Medication Sig   allopurinol (ZYLOPRIM) 300 MG tablet Take 1 tablet Daily to Prevent Gout   Blood Glucose Monitoring Suppl (FREESTYLE LITE) DEVI 1 Device by Does not apply route 3 (three) times daily.   Cholecalciferol (VITAMIN D PO) Take 1 capsule by mouth daily.   cyclobenzaprine (FLEXERIL) 5 MG tablet Take 1 tablet (5 mg total) by mouth 3 (three) times daily as needed for muscle spasms.   glipiZIDE (GLUCOTROL) 5 MG tablet Take  1/2 to 1 tablet  2 x /day  with Lunch & Supper  if Glucose is over 200 mg%   glucose blood (FREESTYLE LITE) test strip Check blood sugar 1 to 2 times daily. DX-E11.9   ibuprofen (ADVIL) 200 MG tablet Take 200 mg by mouth daily as needed (pain).   Lancets (FREESTYLE) lancets Use as instructed   MAGNESIUM PO Take by mouth.   mirabegron ER (MYRBETRIQ) 25 MG TB24 tablet Take 1 tablet  Daily for Bladder control   olmesartan (BENICAR) 20 MG tablet Take 1 tablet (20 mg total) by mouth daily.   rosuvastatin (CRESTOR) 10 MG tablet Take  1 tablet  Daily  for Cholesterol   tirzepatide (MOUNJARO) 12.5 MG/0.5ML Pen Inject   1 pen (12.5 mg)   into Skin  every 7 days   for Diabetes  (Dx: e11.29)   Zinc 50 MG TABS Take 1 tablet Daily (Patient taking differently: Take 50 mg by mouth daily.)     Allergies  Allergen Reactions   Codeine Itching   Nsaids Other (See Comments)    Bleeding ulcer 2013     Problem list  He has Hypertension; Hyperlipidemia associated with type 2 diabetes mellitus ([facility]); Vitamin D  deficiency; Medication management; Testosterone deficiency; Obesity (BMI 30.0-34.9); GERD; T2_NIDDM ([facility]); Gout; CKD stage 2 due to type 2 diabetes mellitus ([facility]); History of adenomatous polyp of colon; Hepatic steatosis; Asymptomatic gallstones; Syncope; and AKI (acute kidney injury) ([facility]) on their problem list.   Observations/Objective:  BP (!) 160/80   Pulse 94   Temp 97.9 F (36.6 C)   Resp 16   Ht 5' 11.5" (1.816 m)   Wt 259 lb 12.8 oz (117.8 kg)   SpO2 98%   BMI 35.73 kg/m   HEENT - WNL. Neck - supple.  Chest - Clear equal BS. Cor - Nl HS. RRR w/o sig MGR. PP 1(+). No edema. MS- FROM w/o deformities.  Gait Nl.  Rt knee - Nl ROM no effusion, synovitis , clicks or crepitus.   Neuro -  Nl w/o focal abnormalities.   Assessment and Plan:   1. Acute pain of right knee  - dexamethasone 4 MG tablet;  Take 1 tab 3 x day - 3 days, then 2 x day - 3 days, then 1 tab daily   Dispense: 20 tablet; Refill: 0   Follow Up Instructions:        I discussed the assessment and treatment plan with the patient. The patient was  provided an opportunity to ask questions and all were answered. The patient agreed with the plan and demonstrated an understanding of the instructions.       The patient was advised to call back or seek an in-person evaluation if the symptoms worsen or if the condition fails to improve as anticipated.    Marinus Maw, MD

## 2022-10-21 ENCOUNTER — Ambulatory Visit: Payer: Commercial Managed Care - PPO | Admitting: Internal Medicine

## 2022-10-21 ENCOUNTER — Encounter: Payer: Self-pay | Admitting: Internal Medicine

## 2022-10-21 VITALS — BP 160/80 | HR 94 | Temp 97.9°F | Resp 16 | Ht 71.5 in | Wt 259.8 lb

## 2022-10-21 DIAGNOSIS — M25561 Pain in right knee: Secondary | ICD-10-CM | POA: Diagnosis not present

## 2022-10-21 MED ORDER — DEXAMETHASONE 4 MG PO TABS: ORAL_TABLET | ORAL | 0 refills | Status: DC

## 2022-10-21 NOTE — Patient Instructions (Signed)
Acute Knee Pain, Adult Acute knee pain is sudden and may be caused by damage, swelling, or irritation of the muscles and tissues that support the knee. Pain may result from: A fall. An injury to the knee from twisting motions. A hit to the knee. Infection. Acute knee pain may go away on its own with time and rest. If it does not, your health care provider may order tests to find the cause of the pain. These may include: Imaging tests, such as an X-ray, MRI, CT scan, or ultrasound. Joint aspiration. In this test, fluid is removed from the knee and evaluated. Arthroscopy. In this test, a lighted tube is inserted into the knee and an image is projected onto a TV screen. Biopsy. In this test, a sample of tissue is removed from the body and studied under a microscope. Follow these instructions at home: If you have a knee sleeve or brace:  Wear the knee sleeve or brace as told by your health care provider. Remove it only as told by your health care provider. Loosen it if your toes tingle, become numb, or turn cold and blue. Keep it clean. If the knee sleeve or brace is not waterproof: Do not let it get wet. Cover it with a watertight covering when you take a bath or shower. Activity Rest your knee. Do not do things that cause pain or make pain worse. Avoid high-impact activities or exercises, such as running, jumping rope, or doing jumping jacks. Work with a physical therapist to make a safe exercise program, as recommended by your health care provider. Do exercises as told by your physical therapist. Managing pain, stiffness, and swelling  If directed, put ice on the affected knee. To do this: If you have a removable knee sleeve or brace, remove it as told by your health care provider. Put ice in a plastic bag. Place a towel between your skin and the bag. Leave the ice on for 20 minutes, 2-3 times a day. Remove the ice if your skin turns bright red. This is very important. If you cannot  feel pain, heat, or cold, you have a greater risk of damage to the area. If directed, use an elastic bandage to put pressure (compression) on your injured knee. This may control swelling, give support, and help with discomfort. Raise (elevate) your knee above the level of your heart while you are sitting or lying down. Sleep with a pillow under your knee. General instructions Take over-the-counter and prescription medicines only as told by your health care provider. Do not use any products that contain nicotine or tobacco, such as cigarettes, e-cigarettes, and chewing tobacco. If you need help quitting, ask your health care provider. If you are overweight, work with your health care provider and a dietitian to set a weight-loss goal that is healthy and reasonable for you. Extra weight can put pressure on your knee. Pay attention to any changes in your symptoms. Keep all follow-up visits. This is important. Contact a health care provider if: Your knee pain continues, changes, or gets worse. You have a fever along with knee pain. Your knee feels warm to the touch or is red. Your knee buckles or locks up. Get help right away if: Your knee swells, and the swelling becomes worse. You cannot move your knee. You have severe pain in your knee that cannot be managed with pain medicine. Summary Acute knee pain can be caused by a fall, an injury, an infection, or damage, swelling, or irritation   of the tissues that support your knee. Your health care provider may perform tests to find out the cause of the pain. Pay attention to any changes in your symptoms. Relieve your pain with rest, medicines, light activity, and the use of ice. Get help right away if your knee swells, you cannot move your knee, or you have severe pain that cannot be managed with medicine. This information is not intended to replace advice given to you by your health care provider. Make sure you discuss any questions you have with  your health care provider. Document Revised: 10/29/2019 Document Reviewed: 10/30/2019 Elsevier Patient Education  2024 Elsevier Inc.  

## 2022-10-26 ENCOUNTER — Other Ambulatory Visit: Payer: Self-pay | Admitting: Nurse Practitioner

## 2022-10-26 DIAGNOSIS — E1122 Type 2 diabetes mellitus with diabetic chronic kidney disease: Secondary | ICD-10-CM

## 2022-11-04 ENCOUNTER — Encounter: Payer: Commercial Managed Care - PPO | Admitting: Internal Medicine

## 2022-12-14 NOTE — Progress Notes (Signed)
FOLLOW UP  Assessment and Plan:   Hypertension Elevated, has not been taking his Olmesartan- STRESSED THE IMPORTANCE OF BP REGULATION AND RISK OF HEART ATTACK AND STROKE WITH UNCONTROLLED HTN- pt verbalizes understanding Monitor blood pressure at home; patient to call if consistently greater than 140/80 and will increase benicar dose Continue DASH diet.   Reminder to go to the ER if any CP, SOB, nausea, dizziness, severe HA, changes vision/speech, left arm numbness and tingling and jaw pain. Follow up recheck 2 weeks with home BP log  Hyperlipidemia associated with Type 2 Diabetes Mellitus(HCC) Stopped rosuvastatin stating it made him have brain fog, declines statin medications Continue low cholesterol diet and exercise.  Check lipid panel.   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  Perform daily foot/skin check, notify office of any concerning changes.  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) Increase fluids, avoid NSAIDS, monitor sugars, will monitor - CBC, CMP  Type 2 Diabetes Mellitus with Morbid Obesity(HCC) Increase Mounjaro to 15 mg SQ QW Long discussion about weight loss, diet, and exercise Recommended diet heavy in fruits and veggies and low in animal meats, cheeses, and dairy products, appropriate calorie intake Discussed ideal weight for height Add exercise Will follow up in 3 months  Vitamin D Def At goal, continue supplementation Goal of 60-100 Defer checking vitamin D level  Gout He reports improved with lifestyle, not taking allopurinol  Diet discussed Check uric acid as needed  Medication Management - CBC, CMP  Continue diet and meds as discussed. Further disposition pending results of labs. Discussed med's effects and SE's.   Over 30 minutes of  exam, counseling, chart review, and critical decision making was performed.   Future Appointments  Date Time Provider Department Center  03/27/2023 11:00 AM Lucky Cowboy, MD [facility] None    ----------------------------------------------------------------------------------------------------------------------  HPI 62 y.o. male  presents for 3 month follow up on hypertension, cholesterol, diabetes with CKD, morbid obesity, gout and vitamin D deficiency.   He has been noticing right knee pain and has been using Meloxicam as needed. The pain will be intermittent.Previously used dexamethasone taper and helped relieve pain.   BMI is Body mass index is 35.23 kg/m., he is working on diet right now, active on his job. Has cut out soda, doing mostly unsweet tea. Has cut portions, will not finish his plate. Avoiding fries. He is active with his job and doing a lot of walking Wt Readings from Last 3 Encounters:  12/15/22 256 lb 3.2 oz (116.2 kg)  10/21/22 259 lb 12.8 oz (117.8 kg)  08/29/22 259 lb 3.2 oz (117.6 kg)   He has been prescribed Olmesartan 20 mg every day but has not been taking, today their BP is BP: (!) 142/92,  BP Readings from Last 3 Encounters:  12/15/22 (!) 142/92  10/21/22 (!) 160/80  08/29/22 (!) 154/80  He reports has picked up pill dispenser to help with compliance/consistency and doing much better with this  He does not workout. He denies chest pain, shortness of breath, dizziness.   He has been working on diet and exercise for T2 diabetes CKD stage 2 he is on benicar 20 mg QD Hyperlipidemia rosuvastatin 10 mg- states makes his head foggy He has very little dairy and fried foods, has not cut  back red meat, eggs Currently on Mounjaro 12.5 mg daily  glipizide 5 mg - taking 1 tab daily with breakfast,  Was started on Mounjaro 12.5 but was stopped but willing to restart denies foot ulcerations, hyperglycemia, increased appetite, nausea, paresthesia of the feet,  polydipsia, polyuria, visual disturbances, vomiting and weight loss.  He has not been checking his blood sugars Last A1C in the office was:  Lab Results  Component Value Date   HGBA1C 9.1 (H) 08/29/2022   He has CKD stage 2 associated with T2DM monitored at this office, on olmesartan:  Lab Results  Component Value Date   EGFR 80 08/29/2022    Lab Results  Component Value Date   CHOL 204 (H) 08/29/2022   HDL 38 (L) 08/29/2022   LDLCALC 112 (H) 08/29/2022   TRIG 378 (H) 08/29/2022   CHOLHDL 5.4 (H) 08/29/2022   Patient is on Vitamin D supplement and at goal at last check:   Lab Results  Component Value Date   VD25OH 37 08/29/2022     Patient is prescribed allopurinol for gout but doesn't take, and does not report a recent flare. No flares since cutting out drinks.  Lab Results  Component Value Date   LABURIC 7.8 08/29/2022   He has a history of testosterone deficiency and is on zinc supplement, has declined supplement. No debilitating sx.  Lab Results  Component Value Date   TESTOSTERONE 369 02/14/2022   Hx of LFT elevations, did have negative hepatitis panel in 2015, recent normal iron in 07/2019, fatty liver per abd [place] 11/2019.  Lab Results  Component Value Date   ALT 43 08/29/2022   AST 27 08/29/2022   ALKPHOS 42 10/22/2021   BILITOT 0.7 08/29/2022      Current Medications:  Current Outpatient Medications on File Prior to Visit  Medication Sig   allopurinol (ZYLOPRIM) 300 MG tablet Take 1 tablet Daily to Prevent Gout   Blood Glucose Monitoring Suppl (FREESTYLE LITE) DEVI 1 Device by Does not apply route 3 (three) times daily.   Cholecalciferol (VITAMIN D PO) Take 1 capsule by mouth daily.   cyclobenzaprine (FLEXERIL) 5 MG tablet Take 1 tablet (5 mg total) by mouth 3 (three) times daily as needed for muscle spasms.   glipiZIDE (GLUCOTROL) 5 MG tablet Take  1/2 to 1 tablet  2 x /day  with Lunch & Supper  if Glucose is over 200 mg%   glucose blood (FREESTYLE LITE)  test strip Check blood sugar 1 to 2 times daily. DX-E11.9   ibuprofen (ADVIL) 200 MG tablet Take 200 mg by mouth daily as needed (pain).   Lancets (FREESTYLE) lancets Use as instructed   MAGNESIUM PO Take by mouth.   mirabegron ER (MYRBETRIQ) 25 MG TB24 tablet Take 1 tablet  Daily for Bladder control   tirzepatide (MOUNJARO) 12.5 MG/0.5ML Pen Inject   1 pen (12.5 mg)   into Skin  every 7 days   for Diabetes  (Dx: e11.29)   Zinc 50 MG TABS Take 1 tablet Daily (Patient taking differently: Take 50 mg by mouth daily.)   dexamethasone (DECADRON) 4 MG tablet Take 1 tab 3 x day - 3 days, then 2 x day - 3 days, then 1 tab daily (Patient not taking: Reported on 12/15/2022)   olmesartan (BENICAR) 20 MG tablet Take 1 tablet (20 mg total) by mouth daily. (Patient not taking: Reported on 12/15/2022)   rosuvastatin (CRESTOR) 10 MG tablet Take  1 tablet  Daily  for Cholesterol (  Patient not taking: Reported on 12/15/2022)   No current facility-administered medications on file prior to visit.     Allergies:  Allergies  Allergen Reactions   Codeine Itching   Nsaids Other (See Comments)    Bleeding ulcer 2013     Medical History:  Past Medical History:  Diagnosis Date   Allergy    seasonal   Arthritis    Blood transfusion 09/2011   2 units   Diabetes mellitus without complication (HCC)    History of adenomatous polyp of colon 10/07/2019   Due 10/03/2022 per Dr. Marina Goodell   Hyperlipidemia    Hypertension    Renal disorder    Stomach ulcer    Syncope    Family history- Reviewed and unchanged Social history- Reviewed and unchanged   Review of Systems:  Review of Systems  Constitutional:  Negative for malaise/fatigue and weight loss.  HENT:  Negative for congestion, hearing loss and tinnitus.   Eyes:  Negative for blurred vision and double vision.  Respiratory:  Negative for cough, sputum production, shortness of breath and wheezing.   Cardiovascular:  Negative for chest pain, palpitations,  orthopnea, claudication and leg swelling.  Gastrointestinal:  Negative for abdominal pain, blood in stool, constipation, diarrhea, heartburn, melena, nausea and vomiting.  Genitourinary: Negative.   Musculoskeletal:  Positive for joint pain (right knee intermittently). Negative for myalgias.  Skin:  Negative for rash.  Neurological:  Negative for dizziness, tingling, sensory change, weakness and headaches.  Endo/Heme/Allergies:  Negative for environmental allergies and polydipsia.  Psychiatric/Behavioral: Negative.    All other systems reviewed and are negative.    Physical Exam: BP (!) 142/92   Pulse 87   Temp (!) 97.5 F (36.4 C)   Ht 5' 11.5" (1.816 m)   Wt 256 lb 3.2 oz (116.2 kg)   SpO2 96%   BMI 35.23 kg/m  Wt Readings from Last 3 Encounters:  12/15/22 256 lb 3.2 oz (116.2 kg)  10/21/22 259 lb 12.8 oz (117.8 kg)  08/29/22 259 lb 3.2 oz (117.6 kg)   General Appearance:Pleasant morbidly obese male, in no apparent distress. Eyes: PERRLA, EOMs, conjunctiva no swelling or erythema Sinuses: No Frontal/maxillary tenderness ENT/Mouth: Ext aud canals clear, TMs without erythema, bulging. No erythema, swelling, or exudate on post pharynx.  Hearing normal.  Neck: Supple, thyroid normal.  Respiratory: Respiratory effort normal, BS equal bilaterally without rales, rhonchi, wheezing or stridor.  Cardio: RRR with no MRGs. Brisk peripheral pulses without edema.  Abdomen: Soft, + BS.  Difficult to examine due to body habitus Lymphatics: Non tender without lymphadenopathy.  Musculoskeletal: Full ROM, 5/5 strength, Normal gait Skin: Warm, dry without rashes, lesions, ecchymosis.  Neuro: Cranial nerves intact. No cerebellar symptoms.  Psych: Awake and oriented X 3, normal affect, Insight and Judgment appropriate.    Raynelle Dick, NP 9:43 AM [facility]

## 2022-12-15 ENCOUNTER — Ambulatory Visit: Payer: Commercial Managed Care - PPO | Admitting: Nurse Practitioner

## 2022-12-15 ENCOUNTER — Encounter: Payer: Self-pay | Admitting: Nurse Practitioner

## 2022-12-15 VITALS — BP 142/92 | HR 87 | Temp 97.5°F | Ht 71.5 in | Wt 256.2 lb

## 2022-12-15 DIAGNOSIS — E1169 Type 2 diabetes mellitus with other specified complication: Secondary | ICD-10-CM

## 2022-12-15 DIAGNOSIS — N182 Chronic kidney disease, stage 2 (mild): Secondary | ICD-10-CM

## 2022-12-15 DIAGNOSIS — E1122 Type 2 diabetes mellitus with diabetic chronic kidney disease: Secondary | ICD-10-CM | POA: Diagnosis not present

## 2022-12-15 DIAGNOSIS — I10 Essential (primary) hypertension: Secondary | ICD-10-CM

## 2022-12-15 DIAGNOSIS — Z79899 Other long term (current) drug therapy: Secondary | ICD-10-CM

## 2022-12-15 DIAGNOSIS — E785 Hyperlipidemia, unspecified: Secondary | ICD-10-CM

## 2022-12-15 DIAGNOSIS — E559 Vitamin D deficiency, unspecified: Secondary | ICD-10-CM

## 2022-12-15 DIAGNOSIS — M1000 Idiopathic gout, unspecified site: Secondary | ICD-10-CM

## 2022-12-15 MED ORDER — MOUNJARO 15 MG/0.5ML ~~LOC~~ SOAJ: 15.0000 mg | SUBCUTANEOUS | 3 refills | Status: DC

## 2022-12-15 MED ORDER — OLMESARTAN MEDOXOMIL 20 MG PO TABS: 20.0000 mg | ORAL_TABLET | Freq: Every day | ORAL | 2 refills | Status: DC

## 2022-12-15 NOTE — Patient Instructions (Signed)
Start olmesartan 20 mg every night at bedtime  Check blood sugar fasting 3 days a week  Increase Mounjaro to 15 mg once a week  Follow up in 2 weeks to recheck BP   Diabetes Mellitus and Nutrition, Adult When you have diabetes, or diabetes mellitus, it is very important to have healthy eating habits because your blood sugar (glucose) levels are greatly affected by what you eat and drink. Eating healthy foods in the right amounts, at about the same times every day, can help you: Manage your blood glucose. Lower your risk of heart disease. Improve your blood pressure. Reach or maintain a healthy weight. What can affect my meal plan? Every person with diabetes is different, and each person has different needs for a meal plan. Your health care provider may recommend that you work with a dietitian to make a meal plan that is best for you. Your meal plan may vary depending on factors such as: The calories you need. The medicines you take. Your weight. Your blood glucose, blood pressure, and cholesterol levels. Your activity level. Other health conditions you have, such as heart or kidney disease. How do carbohydrates affect me? Carbohydrates, also called carbs, affect your blood glucose level more than any other type of food. Eating carbs raises the amount of glucose in your blood. It is important to know how many carbs you can safely have in each meal. This is different for every person. Your dietitian can help you calculate how many carbs you should have at each meal and for each snack. How does alcohol affect me? Alcohol can cause a decrease in blood glucose (hypoglycemia), especially if you use insulin or take certain diabetes medicines by mouth. Hypoglycemia can be a life-threatening condition. Symptoms of hypoglycemia, such as sleepiness, dizziness, and confusion, are similar to symptoms of having too much alcohol. Do not drink alcohol if: Your health care provider tells you not to  drink. You are pregnant, may be pregnant, or are planning to become pregnant. If you drink alcohol: Limit how much you have to: 0-1 drink a day for women. 0-2 drinks a day for men. Know how much alcohol is in your drink. In the [place] one drink equals one 12 oz bottle of beer (355 mL), one 5 oz glass of wine (148 mL), or one 1 oz glass of hard liquor (44 mL). Keep yourself hydrated with water, diet soda, or unsweetened iced tea. Keep in mind that regular soda, juice, and other mixers may contain a lot of sugar and must be counted as carbs. What are tips for following this plan?  Reading food labels Start by checking the serving size on the Nutrition Facts label of packaged foods and drinks. The number of calories and the amount of carbs, fats, and other nutrients listed on the label are based on one serving of the item. Many items contain more than one serving per package. Check the total grams (g) of carbs in one serving. Check the number of grams of saturated fats and trans fats in one serving. Choose foods that have a low amount or none of these fats. Check the number of milligrams (mg) of salt (sodium) in one serving. Most people should limit total sodium intake to less than 2,300 mg per day. Always check the nutrition information of foods labeled as "low-fat" or "nonfat." These foods may be higher in added sugar or refined carbs and should be avoided. Talk to your dietitian to identify your daily goals for nutrients  listed on the label. Shopping Avoid buying canned, pre-made, or processed foods. These foods tend to be high in fat, sodium, and added sugar. Shop around the outside edge of the grocery store. This is where you will most often find fresh fruits and vegetables, bulk grains, fresh meats, and fresh dairy products. Cooking Use low-heat cooking methods, such as baking, instead of high-heat cooking methods, such as deep frying. Cook using healthy oils, such as olive, canola, or  sunflower oil. Avoid cooking with butter, cream, or high-fat meats. Meal planning Eat meals and snacks regularly, preferably at the same times every day. Avoid going long periods of time without eating. Eat foods that are high in fiber, such as fresh fruits, vegetables, beans, and whole grains. Eat 4-6 oz (112-168 g) of lean protein each day, such as lean meat, chicken, fish, eggs, or tofu. One ounce (oz) (28 g) of lean protein is equal to: 1 oz (28 g) of meat, chicken, or fish. 1 egg.  cup (62 g) of tofu. Eat some foods each day that contain healthy fats, such as avocado, nuts, seeds, and fish. What foods should I eat? Fruits Berries. Apples. Oranges. Peaches. Apricots. Plums. Grapes. Mangoes. Papayas. Pomegranates. Kiwi. Cherries. Vegetables Leafy greens, including lettuce, spinach, kale, chard, collard greens, mustard greens, and cabbage. Beets. Cauliflower. Broccoli. Carrots. Green beans. Tomatoes. Peppers. Onions. Cucumbers. Brussels sprouts. Grains Whole grains, such as whole-wheat or whole-grain bread, crackers, tortillas, cereal, and pasta. Unsweetened oatmeal. Quinoa. Brown or wild rice. Meats and other proteins Seafood. Poultry without skin. Lean cuts of poultry and beef. Tofu. Nuts. Seeds. Dairy Low-fat or fat-free dairy products such as milk, yogurt, and cheese. The items listed above may not be a complete list of foods and beverages you can eat and drink. Contact a dietitian for more information. What foods should I avoid? Fruits Fruits canned with syrup. Vegetables Canned vegetables. Frozen vegetables with butter or cream sauce. Grains Refined white flour and flour products such as bread, pasta, snack foods, and cereals. Avoid all processed foods. Meats and other proteins Fatty cuts of meat. Poultry with skin. Breaded or fried meats. Processed meat. Avoid saturated fats. Dairy Full-fat yogurt, cheese, or milk. Beverages Sweetened drinks, such as soda or iced tea. The  items listed above may not be a complete list of foods and beverages you should avoid. Contact a dietitian for more information. Questions to ask a health care provider Do I need to meet with a certified diabetes care and education specialist? Do I need to meet with a dietitian? What number can I call if I have questions? When are the best times to check my blood glucose? Where to find more information: American Diabetes Association: diabetes.org Academy of Nutrition and Dietetics: eatright.[employer] of Diabetes and Digestive and Kidney Diseases: StageSync.si Association of Diabetes Care & Education Specialists: diabeteseducator.org Summary It is important to have healthy eating habits because your blood sugar (glucose) levels are greatly affected by what you eat and drink. It is important to use alcohol carefully. A healthy meal plan will help you manage your blood glucose and lower your risk of heart disease. Your health care provider may recommend that you work with a dietitian to make a meal plan that is best for you. This information is not intended to replace advice given to you by your health care provider. Make sure you discuss any questions you have with your health care provider. Document Revised: 12/18/2019 Document Reviewed: 12/18/2019 Elsevier Patient Education  2024 Elsevier  Inc.

## 2022-12-16 LAB — CBC WITH DIFFERENTIAL/PLATELET
Absolute Monocytes: 757 cells/uL (ref 200–950)
Basophils Absolute: 60 cells/uL (ref 0–200)
Basophils Relative: 0.7 %
Eosinophils Absolute: 292 cells/uL (ref 15–500)
Eosinophils Relative: 3.4 %
HCT: 46.5 % (ref 38.5–50.0)
Hemoglobin: 16.1 g/dL (ref 13.2–17.1)
Lymphs Abs: 2589 cells/uL (ref 850–3900)
MCH: 31.6 pg (ref 27.0–33.0)
MCHC: 34.6 g/dL (ref 32.0–36.0)
MCV: 91.4 fL (ref 80.0–100.0)
MPV: 9.9 fL (ref 7.5–12.5)
Monocytes Relative: 8.8 %
Neutro Abs: 4902 cells/uL (ref 1500–7800)
Neutrophils Relative %: 57 %
Platelets: 226 10*3/uL (ref 140–400)
RBC: 5.09 10*6/uL (ref 4.20–5.80)
RDW: 12.6 % (ref 11.0–15.0)
Total Lymphocyte: 30.1 %
WBC: 8.6 10*3/uL (ref 3.8–10.8)

## 2022-12-16 LAB — COMPLETE METABOLIC PANEL WITH GFR
AG Ratio: 1.7 (calc) (ref 1.0–2.5)
ALT: 38 U/L (ref 9–46)
AST: 25 U/L (ref 10–35)
Albumin: 4.5 g/dL (ref 3.6–5.1)
Alkaline phosphatase (APISO): 55 U/L (ref 35–144)
BUN: 13 mg/dL (ref 7–25)
CO2: 26 mmol/L (ref 20–32)
Calcium: 9.7 mg/dL (ref 8.6–10.3)
Chloride: 103 mmol/L (ref 98–110)
Creat: 1.04 mg/dL (ref 0.70–1.35)
Globulin: 2.6 g/dL (calc) (ref 1.9–3.7)
Glucose, Bld: 174 mg/dL — ABNORMAL HIGH (ref 65–99)
Potassium: 4.7 mmol/L (ref 3.5–5.3)
Sodium: 138 mmol/L (ref 135–146)
Total Bilirubin: 1.1 mg/dL (ref 0.2–1.2)
Total Protein: 7.1 g/dL (ref 6.1–8.1)
eGFR: 82 mL/min/{1.73_m2} (ref >=60)

## 2022-12-16 LAB — LIPID PANEL
Cholesterol: 197 mg/dL (ref <200)
HDL: 44 mg/dL (ref >=40)
LDL Cholesterol (Calc): 128 mg/dL (calc) — ABNORMAL HIGH
Non-HDL Cholesterol (Calc): 153 mg/dL (calc) — ABNORMAL HIGH (ref <130)
Total CHOL/HDL Ratio: 4.5 (calc) (ref <5.0)
Triglycerides: 140 mg/dL (ref <150)

## 2022-12-16 LAB — HEMOGLOBIN A1C W/OUT EAG: Hgb A1c MFr Bld: 8.8 % of total Hgb — ABNORMAL HIGH (ref <5.7)

## 2022-12-28 NOTE — Progress Notes (Unsigned)
FOLLOW UP  Assessment and Plan:   Hypertension Elevated, has not been taking his Olmesartan- STRESSED THE IMPORTANCE OF BP REGULATION AND RISK OF HEART ATTACK AND STROKE WITH UNCONTROLLED HTN- pt verbalizes understanding Monitor blood pressure at home; patient to call if consistently greater than 140/80 and will increase benicar dose Continue DASH diet.   Reminder to go to the ER if any CP, SOB, nausea, dizziness, severe HA, changes vision/speech, left arm numbness and tingling and jaw pain. 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  Perform daily foot/skin check, notify office of any concerning changes.  Check A1C   Type 2 Diabetes Mellitus with Morbid Obesity(HCC) Increase Mounjaro to 15 mg SQ QW Long discussion about weight loss, diet, and exercise Recommended diet heavy in fruits and veggies and low in animal meats, cheeses, and dairy products, appropriate calorie intake Discussed ideal weight for height Add exercise Will follow up in 3 months   Continue diet and meds as discussed. Further disposition pending results of labs. Discussed med's effects and SE's.   Over 30 minutes of exam, counseling, chart review, and critical decision making was performed.   Future Appointments  Date Time Provider Department Center  12/29/2022 10:45 AM Raynelle Dick, NP [facility] None  03/27/2023 11:00 AM Lucky Cowboy, MD [facility] None    ----------------------------------------------------------------------------------------------------------------------  HPI 62 y.o. male  presents for 3 month follow up on hypertension, cholesterol, diabetes with CKD, morbid obesity, gout and vitamin D deficiency.   He has been noticing right knee pain and has been  using Meloxicam as needed. The pain will be intermittent.Previously used dexamethasone taper and helped relieve pain.   BMI is There is no height or weight on file to calculate BMI., he is working on diet right now, active on his job. Has cut out soda, doing mostly unsweet tea. Has cut portions, will not finish his plate. Avoiding fries. He is active with his job and doing a lot of walking Wt Readings from Last 3 Encounters:  12/15/22 256 lb 3.2 oz (116.2 kg)  10/21/22 259 lb 12.8 oz (117.8 kg)  08/29/22 259 lb 3.2 oz (117.6 kg)   He has been prescribed Olmesartan 20 mg every day but has not been taking, today their BP is  ,  BP Readings from Last 3 Encounters:  12/15/22 (!) 142/92  10/21/22 (!) 160/80  08/29/22 (!) 154/80  He reports has picked up pill dispenser to help with compliance/consistency and doing much better with this  He does not workout. He denies chest pain, shortness of breath, dizziness.   He has been working on diet and exercise for T2 diabetes CKD stage 2 he is on benicar 20 mg QD Hyperlipidemia rosuvastatin 10 mg- states makes his head foggy He has very little dairy and fried foods, has not cut back red meat, eggs Currently on Mounjaro 12.5 mg daily  glipizide 5 mg - taking 1 tab daily with breakfast,  Was started on Mounjaro 12.5 but was stopped but willing to restart denies foot ulcerations, hyperglycemia, increased appetite, nausea, paresthesia of the feet, polydipsia, polyuria, visual disturbances, vomiting and weight loss.  He has not been checking his blood sugars Last A1C in the office was:  Lab Results  Component  Value Date   HGBA1C 8.8 (H) 12/15/2022   He has CKD stage 2 associated with T2DM monitored at this office, on olmesartan:  Lab Results  Component Value Date   EGFR 82 12/15/2022    Lab Results  Component Value Date   CHOL 197 12/15/2022   HDL 44 12/15/2022   LDLCALC 128 (H) 12/15/2022   TRIG 140 12/15/2022   CHOLHDL 4.5 12/15/2022    Patient is on Vitamin D supplement and at goal at last check:   Lab Results  Component Value Date   VD25OH 37 08/29/2022     Patient is prescribed allopurinol for gout but doesn't take, and does not report a recent flare. No flares since cutting out drinks.  Lab Results  Component Value Date   LABURIC 7.8 08/29/2022   He has a history of testosterone deficiency and is on zinc supplement, has declined supplement. No debilitating sx.  Lab Results  Component Value Date   TESTOSTERONE 369 02/14/2022   Hx of LFT elevations, did have negative hepatitis panel in 2015, recent normal iron in 07/2019, fatty liver per abd [place] 11/2019.  Lab Results  Component Value Date   ALT 38 12/15/2022   AST 25 12/15/2022   ALKPHOS 42 10/22/2021   BILITOT 1.1 12/15/2022      Current Medications:  Current Outpatient Medications on File Prior to Visit  Medication Sig   allopurinol (ZYLOPRIM) 300 MG tablet Take 1 tablet Daily to Prevent Gout   Blood Glucose Monitoring Suppl (FREESTYLE LITE) DEVI 1 Device by Does not apply route 3 (three) times daily.   Cholecalciferol (VITAMIN D PO) Take 1 capsule by mouth daily.   cyclobenzaprine (FLEXERIL) 5 MG tablet Take 1 tablet (5 mg total) by mouth 3 (three) times daily as needed for muscle spasms.   glipiZIDE (GLUCOTROL) 5 MG tablet Take  1/2 to 1 tablet  2 x /day  with Lunch & Supper  if Glucose is over 200 mg%   glucose blood (FREESTYLE LITE) test strip Check blood sugar 1 to 2 times daily. DX-E11.9   ibuprofen (ADVIL) 200 MG tablet Take 200 mg by mouth daily as needed (pain).   Lancets (FREESTYLE) lancets Use as instructed   MAGNESIUM PO Take by mouth.   mirabegron ER (MYRBETRIQ) 25 MG TB24 tablet Take 1 tablet  Daily for Bladder control   olmesartan (BENICAR) 20 MG tablet Take 1 tablet (20 mg total) by mouth daily.   rosuvastatin (CRESTOR) 10 MG tablet Take  1 tablet  Daily  for Cholesterol (Patient not taking: Reported on 12/15/2022)   tirzepatide  (MOUNJARO) 15 MG/0.5ML Pen Inject 15 mg into the skin once a week.   Zinc 50 MG TABS Take 1 tablet Daily (Patient taking differently: Take 50 mg by mouth daily.)   No current facility-administered medications on file prior to visit.     Allergies:  Allergies  Allergen Reactions   Codeine Itching   Nsaids Other (See Comments)    Bleeding ulcer 2013     Medical History:  Past Medical History:  Diagnosis Date   Allergy    seasonal   Arthritis    Blood transfusion 09/2011   2 units   Diabetes mellitus without complication (HCC)    History of adenomatous polyp of colon 10/07/2019   Due 10/03/2022 per Dr. Marina Goodell   Hyperlipidemia    Hypertension    Renal disorder    Stomach ulcer    Syncope    Family history- Reviewed and unchanged  Social history- Reviewed and unchanged   Review of Systems:  Review of Systems  Constitutional:  Negative for malaise/fatigue and weight loss.  HENT:  Negative for congestion, hearing loss and tinnitus.   Eyes:  Negative for blurred vision and double vision.  Respiratory:  Negative for cough, sputum production, shortness of breath and wheezing.   Cardiovascular:  Negative for chest pain, palpitations, orthopnea, claudication and leg swelling.  Gastrointestinal:  Negative for abdominal pain, blood in stool, constipation, diarrhea, heartburn, melena, nausea and vomiting.  Genitourinary: Negative.   Musculoskeletal:  Positive for joint pain (right knee intermittently). Negative for myalgias.  Skin:  Negative for rash.  Neurological:  Negative for dizziness, tingling, sensory change, weakness and headaches.  Endo/Heme/Allergies:  Negative for environmental allergies and polydipsia.  Psychiatric/Behavioral: Negative.    All other systems reviewed and are negative.    Physical Exam: There were no vitals taken for this visit. Wt Readings from Last 3 Encounters:  12/15/22 256 lb 3.2 oz (116.2 kg)  10/21/22 259 lb 12.8 oz (117.8 kg)  08/29/22 259 lb  3.2 oz (117.6 kg)   General Appearance:Pleasant morbidly obese male, in no apparent distress. Eyes: PERRLA, EOMs, conjunctiva no swelling or erythema Sinuses: No Frontal/maxillary tenderness ENT/Mouth: Ext aud canals clear, TMs without erythema, bulging. No erythema, swelling, or exudate on post pharynx.  Hearing normal.  Neck: Supple, thyroid normal.  Respiratory: Respiratory effort normal, BS equal bilaterally without rales, rhonchi, wheezing or stridor.  Cardio: RRR with no MRGs. Brisk peripheral pulses without edema.  Abdomen: Soft, + BS.  Difficult to examine due to body habitus Lymphatics: Non tender without lymphadenopathy.  Musculoskeletal: Full ROM, 5/5 strength, Normal gait Skin: Warm, dry without rashes, lesions, ecchymosis.  Neuro: Cranial nerves intact. No cerebellar symptoms.  Psych: Awake and oriented X 3, normal affect, Insight and Judgment appropriate.    Raynelle Dick, NP 12:27 PM [facility]

## 2022-12-29 ENCOUNTER — Ambulatory Visit: Payer: Commercial Managed Care - PPO | Admitting: Nurse Practitioner

## 2022-12-29 ENCOUNTER — Encounter: Payer: Self-pay | Admitting: Nurse Practitioner

## 2022-12-29 VITALS — BP 126/82 | HR 93 | Temp 97.5°F | Ht 71.5 in | Wt 255.6 lb

## 2022-12-29 DIAGNOSIS — E1169 Type 2 diabetes mellitus with other specified complication: Secondary | ICD-10-CM | POA: Diagnosis not present

## 2022-12-29 DIAGNOSIS — E1122 Type 2 diabetes mellitus with diabetic chronic kidney disease: Secondary | ICD-10-CM | POA: Diagnosis not present

## 2022-12-29 DIAGNOSIS — N182 Chronic kidney disease, stage 2 (mild): Secondary | ICD-10-CM

## 2022-12-29 DIAGNOSIS — I10 Essential (primary) hypertension: Secondary | ICD-10-CM

## 2022-12-29 NOTE — Patient Instructions (Signed)
Hypertension, Adult High blood pressure (hypertension) is when the force of blood pumping through the arteries is too strong. The arteries are the blood vessels that carry blood from the heart throughout the body. Hypertension forces the heart to work harder to pump blood and may cause arteries to become narrow or stiff. Untreated or uncontrolled hypertension can lead to a heart attack, heart failure, a stroke, kidney disease, and other problems. A blood pressure reading consists of a higher number over a lower number. Ideally, your blood pressure should be below 120/80. The first ("top") number is called the systolic pressure. It is a measure of the pressure in your arteries as your heart beats. The second ("bottom") number is called the diastolic pressure. It is a measure of the pressure in your arteries as the heart relaxes. What are the causes? The exact cause of this condition is not known. There are some conditions that result in high blood pressure. What increases the risk? Certain factors may make you more likely to develop high blood pressure. Some of these risk factors are under your control, including: Smoking. Not getting enough exercise or physical activity. Being overweight. Having too much fat, sugar, calories, or salt (sodium) in your diet. Drinking too much alcohol. Other risk factors include: Having a personal history of heart disease, diabetes, high cholesterol, or kidney disease. Stress. Having a family history of high blood pressure and high cholesterol. Having obstructive sleep apnea. Age. The risk increases with age. What are the signs or symptoms? High blood pressure may not cause symptoms. Very high blood pressure (hypertensive crisis) may cause: Headache. Fast or irregular heartbeats (palpitations). Shortness of breath. Nosebleed. Nausea and vomiting. Vision changes. Severe chest pain, dizziness, and seizures. How is this diagnosed? This condition is diagnosed by  measuring your blood pressure while you are seated, with your arm resting on a flat surface, your legs uncrossed, and your feet flat on the floor. The cuff of the blood pressure monitor will be placed directly against the skin of your upper arm at the level of your heart. Blood pressure should be measured at least twice using the same arm. Certain conditions can cause a difference in blood pressure between your right and left arms. If you have a high blood pressure reading during one visit or you have normal blood pressure with other risk factors, you may be asked to: Return on a different day to have your blood pressure checked again. Monitor your blood pressure at home for 1 week or longer. If you are diagnosed with hypertension, you may have other blood or imaging tests to help your health care provider understand your overall risk for other conditions. How is this treated? This condition is treated by making healthy lifestyle changes, such as eating healthy foods, exercising more, and reducing your alcohol intake. You may be referred for counseling on a healthy diet and physical activity. Your health care provider may prescribe medicine if lifestyle changes are not enough to get your blood pressure under control and if: Your systolic blood pressure is above 130. Your diastolic blood pressure is above 80. Your personal target blood pressure may vary depending on your medical conditions, your age, and other factors. Follow these instructions at home: Eating and drinking  Eat a diet that is high in fiber and potassium, and low in sodium, added sugar, and fat. An example of this eating plan is called the DASH diet. DASH stands for Dietary Approaches to Stop Hypertension. To eat this way: Eat   plenty of fresh fruits and vegetables. Try to fill one half of your plate at each meal with fruits and vegetables. Eat whole grains, such as whole-wheat pasta, brown rice, or whole-grain bread. Fill about one  fourth of your plate with whole grains. Eat or drink low-fat dairy products, such as skim milk or low-fat yogurt. Avoid fatty cuts of meat, processed or cured meats, and poultry with skin. Fill about one fourth of your plate with lean proteins, such as fish, chicken without skin, beans, eggs, or tofu. Avoid pre-made and processed foods. These tend to be higher in sodium, added sugar, and fat. Reduce your daily sodium intake. Many people with hypertension should eat less than 1,500 mg of sodium a day. Do not drink alcohol if: Your health care provider tells you not to drink. You are pregnant, may be pregnant, or are planning to become pregnant. If you drink alcohol: Limit how much you have to: 0-1 drink a day for women. 0-2 drinks a day for men. Know how much alcohol is in your drink. In the [place] one drink equals one 12 oz bottle of beer (355 mL), one 5 oz glass of wine (148 mL), or one 1 oz glass of hard liquor (44 mL). Lifestyle  Work with your health care provider to maintain a healthy body weight or to lose weight. Ask what an ideal weight is for you. Get at least 30 minutes of exercise that causes your heart to beat faster (aerobic exercise) most days of the week. Activities may include walking, swimming, or biking. Include exercise to strengthen your muscles (resistance exercise), such as Pilates or lifting weights, as part of your weekly exercise routine. Try to do these types of exercises for 30 minutes at least 3 days a week. Do not use any products that contain nicotine or tobacco. These products include cigarettes, chewing tobacco, and vaping devices, such as e-cigarettes. If you need help quitting, ask your health care provider. Monitor your blood pressure at home as told by your health care provider. Keep all follow-up visits. This is important. Medicines Take over-the-counter and prescription medicines only as told by your health care provider. Follow directions carefully. Blood  pressure medicines must be taken as prescribed. Do not skip doses of blood pressure medicine. Doing this puts you at risk for problems and can make the medicine less effective. Ask your health care provider about side effects or reactions to medicines that you should watch for. Contact a health care provider if you: Think you are having a reaction to a medicine you are taking. Have headaches that keep coming back (recurring). Feel dizzy. Have swelling in your ankles. Have trouble with your vision. Get help right away if you: Develop a severe headache or confusion. Have unusual weakness or numbness. Feel faint. Have severe pain in your chest or abdomen. Vomit repeatedly. Have trouble breathing. These symptoms may be an emergency. Get help right away. Call 911. Do not wait to see if the symptoms will go away. Do not drive yourself to the hospital. Summary Hypertension is when the force of blood pumping through your arteries is too strong. If this condition is not controlled, it may put you at risk for serious complications. Your personal target blood pressure may vary depending on your medical conditions, your age, and other factors. For most people, a normal blood pressure is less than 120/80. Hypertension is treated with lifestyle changes, medicines, or a combination of both. Lifestyle changes include losing weight, eating a healthy,   low-sodium diet, exercising more, and limiting alcohol. This information is not intended to replace advice given to you by your health care provider. Make sure you discuss any questions you have with your health care provider. Document Revised: 03/23/2021 Document Reviewed: 03/23/2021 Elsevier Patient Education  2024 [employer]  

## 2023-01-07 ENCOUNTER — Other Ambulatory Visit: Payer: Self-pay | Admitting: Nurse Practitioner

## 2023-01-07 DIAGNOSIS — I10 Essential (primary) hypertension: Secondary | ICD-10-CM

## 2023-02-16 ENCOUNTER — Encounter: Payer: Commercial Managed Care - PPO | Admitting: Internal Medicine

## 2023-02-20 ENCOUNTER — Other Ambulatory Visit: Payer: Self-pay | Admitting: Nurse Practitioner

## 2023-02-20 ENCOUNTER — Telehealth: Payer: Self-pay | Admitting: Nurse Practitioner

## 2023-02-20 DIAGNOSIS — E1122 Type 2 diabetes mellitus with diabetic chronic kidney disease: Secondary | ICD-10-CM

## 2023-02-20 MED ORDER — MOUNJARO 15 MG/0.5ML ~~LOC~~ SOAJ
15.0000 mg | SUBCUTANEOUS | 3 refills | Status: DC
Start: 2023-02-20 — End: 2023-05-17

## 2023-02-20 NOTE — Telephone Encounter (Signed)
Patient is requesting a refill on Mounjaro to [facility] on [address].

## 2023-03-27 ENCOUNTER — Encounter: Payer: Commercial Managed Care - PPO | Admitting: Internal Medicine

## 2023-04-05 ENCOUNTER — Other Ambulatory Visit: Payer: Self-pay | Admitting: Internal Medicine

## 2023-04-05 DIAGNOSIS — N3281 Overactive bladder: Secondary | ICD-10-CM

## 2023-04-05 MED ORDER — MIRABEGRON ER 25 MG PO TB24: ORAL_TABLET | ORAL | 3 refills | Status: DC

## 2023-04-13 ENCOUNTER — Encounter: Payer: Self-pay | Admitting: Internal Medicine

## 2023-04-13 NOTE — Progress Notes (Signed)
Assessment and Plan:  Paul Warner was seen today for acute visit.  Diagnoses and all orders for this visit:  Essential hypertension - continue medications- Olmesartan 20 mg every day , DASH diet, exercise and monitor at home. Call if greater than 130/80.   Type 2 diabetes mellitus with stage 2 chronic kidney disease, without long-term current use of insulin (HCC) Continue medications: Continue Mounjaro 15 mg SQ QW and Glipizide 5 mg QAM Continue diet and exercise.  Perform daily foot/skin check, notify office of any concerning changes.  Check A1C   Urinary frequency Push fluids Continue Myrbetriq Start Cipro BID x 7 If no improvement will plan to refer to urology -     Urinalysis, Routine w reflex microscopic -     Urine Culture -     ciprofloxacin (CIPRO) 500 MG tablet; Take 1 tablet (500 mg total) by mouth 2 (two) times daily for 7 days.       Further disposition pending results of labs. Discussed med's effects and SE's.   Over 30 minutes of exam, counseling, chart review, and critical decision making was performed.   Future Appointments  Date Time Provider Department Center  04/14/2023  9:45 AM Raynelle Dick, NP [facility] None  05/02/2023  3:00 PM Lucky Cowboy, MD [facility] None    ------------------------------------------------------------------------------------------------------------------   HPI BP (!) 168/88   Pulse 76   Temp 97.9 F (36.6 C)   Ht 5' 11.5" (1.816 m)   Wt 253 lb 6.4 oz (114.9 kg)   SpO2 99%   BMI 34.85 kg/m   62 y.o.male presents for complaints of frequency and urgency of urination with an odor. Symptoms have been occurring for approximately 4 weeks.  He was started on Myrbetriq approx 10 days ago with no change in symptoms. Denies back pain and abdominal pain.  BP well controlled with Olmesartan 20 mg every day  BP Readings from Last 3 Encounters:  04/14/23 (!) 168/88  12/29/22 126/82  12/15/22 (!) 142/92  Denies headaches,  chest pain, shortness of breath and dizziness   BMI is Body mass index is 34.85 kg/m., he has not been working on diet and exercise. Wt Readings from Last 3 Encounters:  04/14/23 253 lb 6.4 oz (114.9 kg)  12/29/22 255 lb 9.6 oz (115.9 kg)  12/15/22 256 lb 3.2 oz (116.2 kg)   He is currently on Mounjaro 15 mg SQ QW for type 2 diabetes mellitus and on ARB for kidney protection. Lab Results  Component Value Date   HGBA1C 8.8 (H) 12/15/2022    Lab Results  Component Value Date   EGFR 82 12/15/2022    Past Medical History:  Diagnosis Date   Allergy    seasonal   Arthritis    Blood transfusion 09/2011   2 units   Diabetes mellitus without complication (HCC)    History of adenomatous polyp of colon 10/07/2019   Due 10/03/2022 per Dr. Marina Goodell   Hyperlipidemia    Hypertension    Renal disorder    Stomach ulcer    Syncope      Allergies  Allergen Reactions   Codeine Itching   Nsaids Other (See Comments)    Bleeding ulcer 2013    Current Outpatient Medications on File Prior to Visit  Medication Sig   allopurinol (ZYLOPRIM) 300 MG tablet Take 1 tablet Daily to Prevent Gout   Blood Glucose Monitoring Suppl (FREESTYLE LITE) DEVI 1 Device by Does not apply route 3 (three) times daily.   Cholecalciferol (VITAMIN D  PO) Take 1 capsule by mouth daily.   cyclobenzaprine (FLEXERIL) 5 MG tablet Take 1 tablet (5 mg total) by mouth 3 (three) times daily as needed for muscle spasms.   glipiZIDE (GLUCOTROL) 5 MG tablet Take  1/2 to 1 tablet  2 x /day  with Lunch & Supper  if Glucose is over 200 mg%   glucose blood (FREESTYLE LITE) test strip Check blood sugar 1 to 2 times daily. DX-E11.9   ibuprofen (ADVIL) 200 MG tablet Take 200 mg by mouth daily as needed (pain).   Lancets (FREESTYLE) lancets Use as instructed   mirabegron ER (MYRBETRIQ) 25 MG TB24 tablet Take 1 tablet  Daily for Bladder control   olmesartan (BENICAR) 20 MG tablet TAKE 1 TABLET BY MOUTH EVERY DAY   rosuvastatin (CRESTOR)  10 MG tablet Take  1 tablet  Daily  for Cholesterol   tirzepatide (MOUNJARO) 15 MG/0.5ML Pen Inject 15 mg into the skin once a week.   Zinc 50 MG TABS Take 1 tablet Daily (Patient taking differently: Take 50 mg by mouth daily.)   MAGNESIUM PO Take by mouth. (Patient not taking: Reported on 04/14/2023)   No current facility-administered medications on file prior to visit.    ROS: all negative except above.   Physical Exam:  BP (!) 168/88   Pulse 76   Temp 97.9 F (36.6 C)   Ht 5' 11.5" (1.816 m)   Wt 253 lb 6.4 oz (114.9 kg)   SpO2 99%   BMI 34.85 kg/m   General Appearance: Well nourished, in no apparent distress. Eyes: PERRLA, EOMs, conjunctiva no swelling or erythema EPP:IRJJOAC normal.  Neck: Supple, thyroid normal.  Respiratory: Respiratory effort normal, BS equal bilaterally without rales, rhonchi, wheezing or stridor.  Cardio: RRR with no MRGs. Brisk peripheral pulses without edema.  Abdomen: Soft, + BS.  Non tender, no guarding, rebound, hernias, masses. Lymphatics: Non tender without lymphadenopathy.  Musculoskeletal: Full ROM, 5/5 strength, normal gait. No CVA tenderness Skin: Warm, dry without rashes, lesions, ecchymosis.  Neuro: Cranial nerves intact. Normal muscle tone, no cerebellar symptoms. Sensation intact.  Psych: Awake and oriented X 3, normal affect, Insight and Judgment appropriate.     Raynelle Dick, NP 9:37 AM [facility]

## 2023-04-14 ENCOUNTER — Ambulatory Visit: Payer: Commercial Managed Care - PPO | Admitting: Nurse Practitioner

## 2023-04-14 ENCOUNTER — Encounter: Payer: Self-pay | Admitting: Nurse Practitioner

## 2023-04-14 VITALS — BP 140/74 | HR 76 | Temp 97.9°F | Ht 71.5 in | Wt 253.4 lb

## 2023-04-14 DIAGNOSIS — R350 Frequency of micturition: Secondary | ICD-10-CM | POA: Diagnosis not present

## 2023-04-14 DIAGNOSIS — N182 Chronic kidney disease, stage 2 (mild): Secondary | ICD-10-CM | POA: Diagnosis not present

## 2023-04-14 DIAGNOSIS — E1122 Type 2 diabetes mellitus with diabetic chronic kidney disease: Secondary | ICD-10-CM

## 2023-04-14 DIAGNOSIS — I10 Essential (primary) hypertension: Secondary | ICD-10-CM | POA: Diagnosis not present

## 2023-04-14 MED ORDER — CIPROFLOXACIN HCL 500 MG PO TABS
500.0000 mg | ORAL_TABLET | Freq: Two times a day (BID) | ORAL | 0 refills | Status: AC
Start: 2023-04-14 — End: 2023-04-21

## 2023-04-14 NOTE — Progress Notes (Signed)
Assessment and Plan:  Cean was seen today for acute visit.  Diagnoses and all orders for this visit:  Essential hypertension - continue medications- Olmesartan 20 mg every day , DASH diet, exercise and monitor at home. Call if greater than 130/80.   Type 2 diabetes mellitus with stage 2 chronic kidney disease, without long-term current use of insulin (HCC) Continue medications: Continue Mounjaro 15 mg SQ QW and Glipizide 5 mg QAM Continue diet and exercise.  Perform daily foot/skin check, notify office of any concerning changes.  Check A1C   Urinary frequency Push fluids Continue Myrbetriq Start Cipro BID x 7 If no improvement will plan to refer to urology -     Urinalysis, Routine w reflex microscopic -     Urine Culture -     ciprofloxacin (CIPRO) 500 MG tablet; Take 1 tablet (500 mg total) by mouth 2 (two) times daily for 7 days.       Further disposition pending results of labs. Discussed med's effects and SE's.   Over 30 minutes of exam, counseling, chart review, and critical decision making was performed.   Future Appointments  Date Time Provider Department Center  05/02/2023  3:00 PM Lucky Cowboy, MD [facility] None    ------------------------------------------------------------------------------------------------------------------   HPI BP (!) 140/74   Pulse 76   Temp 97.9 F (36.6 C)   Ht 5' 11.5" (1.816 m)   Wt 253 lb 6.4 oz (114.9 kg)   SpO2 99%   BMI 34.85 kg/m   62 y.o.male presents for complaints of frequency and urgency of urination with an odor. Symptoms have been occurring for approximately 4 weeks.  He was started on Myrbetriq approx 10 days ago with no change in symptoms. Denies back pain and abdominal pain.  BP well controlled with Olmesartan 20 mg every day  BP Readings from Last 3 Encounters:  04/14/23 (!) 140/74  12/29/22 126/82  12/15/22 (!) 142/92  Denies headaches, chest pain, shortness of breath and dizziness   BMI is Body  mass index is 34.85 kg/m., he has not been working on diet and exercise. Wt Readings from Last 3 Encounters:  04/14/23 253 lb 6.4 oz (114.9 kg)  12/29/22 255 lb 9.6 oz (115.9 kg)  12/15/22 256 lb 3.2 oz (116.2 kg)   He is currently on Mounjaro 15 mg SQ QW for type 2 diabetes mellitus and on ARB for kidney protection. Lab Results  Component Value Date   HGBA1C 8.8 (H) 12/15/2022    Lab Results  Component Value Date   EGFR 82 12/15/2022    Past Medical History:  Diagnosis Date   Allergy    seasonal   Arthritis    Blood transfusion 09/2011   2 units   Diabetes mellitus without complication (HCC)    History of adenomatous polyp of colon 10/07/2019   Due 10/03/2022 per Dr. Marina Goodell   Hyperlipidemia    Hypertension    Renal disorder    Stomach ulcer    Syncope      Allergies  Allergen Reactions   Codeine Itching   Nsaids Other (See Comments)    Bleeding ulcer 2013    Current Outpatient Medications on File Prior to Visit  Medication Sig   allopurinol (ZYLOPRIM) 300 MG tablet Take 1 tablet Daily to Prevent Gout   Blood Glucose Monitoring Suppl (FREESTYLE LITE) DEVI 1 Device by Does not apply route 3 (three) times daily.   Cholecalciferol (VITAMIN D PO) Take 1 capsule by mouth daily.   cyclobenzaprine (FLEXERIL)  5 MG tablet Take 1 tablet (5 mg total) by mouth 3 (three) times daily as needed for muscle spasms.   glipiZIDE (GLUCOTROL) 5 MG tablet Take  1/2 to 1 tablet  2 x /day  with Lunch & Supper  if Glucose is over 200 mg%   glucose blood (FREESTYLE LITE) test strip Check blood sugar 1 to 2 times daily. [id number]   ibuprofen (ADVIL) 200 MG tablet Take 200 mg by mouth daily as needed (pain).   Lancets (FREESTYLE) lancets Use as instructed   mirabegron ER (MYRBETRIQ) 25 MG TB24 tablet Take 1 tablet  Daily for Bladder control   olmesartan (BENICAR) 20 MG tablet TAKE 1 TABLET BY MOUTH EVERY DAY   rosuvastatin (CRESTOR) 10 MG tablet Take  1 tablet  Daily  for Cholesterol    tirzepatide (MOUNJARO) 15 MG/0.5ML Pen Inject 15 mg into the skin once a week.   Zinc 50 MG TABS Take 1 tablet Daily (Patient taking differently: Take 50 mg by mouth daily.)   MAGNESIUM PO Take by mouth. (Patient not taking: Reported on 04/14/2023)   No current facility-administered medications on file prior to visit.    ROS: all negative except above.   Physical Exam:  BP (!) 140/74   Pulse 76   Temp 97.9 F (36.6 C)   Ht 5' 11.5" (1.816 m)   Wt 253 lb 6.4 oz (114.9 kg)   SpO2 99%   BMI 34.85 kg/m   General Appearance: Well nourished, in no apparent distress. Eyes: PERRLA, EOMs, conjunctiva no swelling or erythema GEX:[id number] normal.  Neck: Supple, thyroid normal.  Respiratory: Respiratory effort normal, BS equal bilaterally without rales, rhonchi, wheezing or stridor.  Cardio: RRR with no MRGs. Brisk peripheral pulses without edema.  Abdomen: Soft, + BS.  Non tender, no guarding, rebound, hernias, masses. Lymphatics: Non tender without lymphadenopathy.  Musculoskeletal: Full ROM, 5/5 strength, normal gait. No CVA tenderness Skin: Warm, dry without rashes, lesions, ecchymosis.  Neuro: Cranial nerves intact. Normal muscle tone, no cerebellar symptoms. Sensation intact.  Psych: Awake and oriented X 3, normal affect, Insight and Judgment appropriate.     Raynelle Dick, NP 9:54 AM [facility]

## 2023-04-14 NOTE — Patient Instructions (Signed)

## 2023-04-15 LAB — URINE CULTURE
MICRO NUMBER:: 15737161
Result:: NO GROWTH
SPECIMEN QUALITY:: ADEQUATE

## 2023-04-15 LAB — URINALYSIS, ROUTINE W REFLEX MICROSCOPIC
Bilirubin Urine: NEGATIVE
Hgb urine dipstick: NEGATIVE
Ketones, ur: NEGATIVE
Leukocytes,Ua: NEGATIVE
Nitrite: NEGATIVE
Protein, ur: NEGATIVE
Specific Gravity, Urine: 1.026 (ref 1.001–1.035)
pH: 6 (ref 5.0–8.0)

## 2023-05-02 ENCOUNTER — Encounter: Payer: Commercial Managed Care - PPO | Admitting: Internal Medicine

## 2023-05-03 ENCOUNTER — Telehealth: Payer: Self-pay

## 2023-05-03 NOTE — Telephone Encounter (Signed)
Prior auth for [facility] 15mg  completed and submitted.

## 2023-05-08 NOTE — Telephone Encounter (Signed)
Prior auth approved for Mounjaro 15mg  through 05/04/24

## 2023-05-16 ENCOUNTER — Encounter: Payer: Self-pay | Admitting: Internal Medicine

## 2023-05-16 ENCOUNTER — Other Ambulatory Visit: Payer: Self-pay | Admitting: Internal Medicine

## 2023-05-16 ENCOUNTER — Other Ambulatory Visit: Payer: Self-pay | Admitting: Nurse Practitioner

## 2023-05-16 DIAGNOSIS — E1122 Type 2 diabetes mellitus with diabetic chronic kidney disease: Secondary | ICD-10-CM

## 2023-05-16 NOTE — Patient Instructions (Signed)

## 2023-05-16 NOTE — Progress Notes (Unsigned)
[place]      ADULT   &   ADOLESCENT      INTERNAL MEDICINE  Lucky Cowboy, M.D.          Rance Muir, ANP        Adela Glimpse, FNP  [facility] [address]  [place], [place]. [address] Telephone [phone] Telefax [phone]   Annual  Screening/Preventative Visit  & Comprehensive Evaluation & Examination   Future Appointments  Date Time Provider Department  05/17/2023                      cpe    10:00 AM Lucky Cowboy, MD [facility]                     cpe 10:00 AM Lucky Cowboy, MD [facility]       This very nice 62 y.o. MWM  presents for a Screening /Preventative Visit & comprehensive evaluation and management of multiple medical co-morbidities.  Patient has been followed for HTN, HLD, T2_NIDDM and Vitamin D Deficiency. Patient has hx/o Gout controlled on his Allopurinol.       HTN predates since 2001 . Patient's BP has not been monitored at home.  Patient has hx/o non-compliance with medications .  Today's BP is  not at goal -  158/80  & rechecked at 177/98.  Patient denies any cardiac symptoms as chest pain, palpitations, shortness of breath, dizziness or ankle swelling.       Patient's hyperlipidemia is not controlled with diet and medications. Patient denies myalgias or other medication SE's. Last lipids were near goal albeit elevated Trig's :  Lab Results  Component Value Date   CHOL 197 12/15/2022   HDL 44 12/15/2022   LDLCALC 128 (H) 12/15/2022   TRIG 140 12/15/2022   CHOLHDL 4.5 12/15/2022        Patient is on Metformin & Glipizide for T2_NIDDM (A1c 7.1% /2017) w/CKD2 (GFR 82) and patient denies reactive hypoglycemic symptoms, visual blurring, diabetic polys or paresthesias.  Patient admits not checking CBGs.   Patient is on Mounjaro 15 mg.  Last A1c was not at goal :  Lab Results  Component Value Date   HGBA1C 8.8 (H) 12/15/2022   Wt Readings from Last 3 Encounters:  05/17/23 244 lb 3.2 oz (110.8 kg)   04/14/23 253 lb 6.4 oz (114.9 kg)  12/29/22 255 lb 9.6 oz (115.9 kg)        Patient has hx/o Testosterone deficiency and was sporadic with his testosterone injections & finally stopped for perceived lack of benefit.        Finally, patient has history of Vitamin D Deficiency ("20" /2008) and last vitamin D was not at goal (70-100):  Lab Results  Component Value Date   VD25OH 37 08/29/2022        Current Outpatient Medications  Medication Instructions   allopurinol  300 MG tablet Take 1 tablet Daily    VITAMIN D  1 capsule  Daily   cyclobenzaprine 5 mg 3 times daily PRN   glipiZIDE   5 MG tablet Take  1/2-1 tablet  2 x /day  with Lunch & Supper  if Glucose is over 200 mg%   ibuprofen 200 mg  Daily PRN   MAGNESIUM  Take daily   mirabegron ER  25 MG  Take 1 tablet  Daily    Mounjaro   15 mg Subcut  Weekly   olmesartan 20 mg Daily  rosuvastatin 10 MG tablet Take  1 tablet  Daily     Zinc 50 MG TABS Take 1 tablet Daily    Allergies  Allergen Reactions   Codeine Nausea And Vomiting   Nsaids Other (See Comments)    Bleeding ulcer 2013    Past Medical History:  Diagnosis Date   Allergy    seasonal   Arthritis    Blood transfusion 09/2011   2 units   Diabetes mellitus without complication (HCC)    History of adenomatous polyp of colon 10/07/2019   Due 10/03/2022 per Dr. Marina Goodell   Hyperlipidemia    Hypertension    Renal disorder    Stomach ulcer     Health Maintenance  Topic Date Due   COVID-19 Vaccine (1) Never done   INFLUENZA VACCINE  Never done   OPHTHALMOLOGY EXAM  06/07/2020   FOOT EXAM  07/21/2020   HEMOGLOBIN A1C  11/09/2020   TETANUS/TDAP  10/09/2021   PNEUMOCOCCAL VACC AGE 15-64 HIGH RISK  Completed   Hepatitis C Screening  Completed   HIV Screening  Completed   HPV VACCINES  Aged Out    Immunization History  Administered Date(s) Administered   PPD Test 05/03/2017, 06/04/2018, 07/23/2019   Pneumococcal - 23 10/04/2010, 05/03/2017   Tdap 10/10/2011     Last Colon - 10/03/2019 - Dr Marina Goodell recc 3 year f/u due May 2024.    Past Surgical History:  Procedure Laterality Date   COLONOSCOPY     ESOPHAGOGASTRODUODENOSCOPY  10/26/2011    Hilarie Fredrickson, MD   FRACTURE SURGERY  broken collar bone   POLYPECTOMY     UPPER GASTROINTESTINAL ENDOSCOPY      Family History  Adopted: Yes  Problem Relation Age of Onset   Breast cancer Mother    Colon cancer Neg Hx    Colon polyps Neg Hx    Esophageal cancer Neg Hx    Rectal cancer Neg Hx    Stomach cancer Neg Hx     Social History   Socioeconomic History   Marital status: Married    Spouse name: Wilkie Aye   Number of children: 1 son   Occupational History   Sales  Tobacco Use   Smoking status: Never Smoker   Smokeless tobacco: Never Used  Substance and Sexual Activity   Alcohol use: Yes    Alcohol/week: 1.0 standard drink    Types: 1 Cans of beer per week    Comment: occasionally    Drug use: No    ROS Constitutional: Denies fever, chills, weight loss/gain, headaches, insomnia,  night sweats or change in appetite. Does c/o fatigue. Eyes: Denies redness, blurred vision, diplopia, discharge, itchy or watery eyes.  ENT: Denies discharge, congestion, post nasal drip, epistaxis, sore throat, earache, hearing loss, dental pain, Tinnitus, Vertigo, Sinus pain or snoring.  Cardio: Denies chest pain, palpitations, irregular heartbeat, syncope, dyspnea, diaphoresis, orthopnea, PND, claudication or edema Respiratory: denies cough, dyspnea, DOE, pleurisy, hoarseness, laryngitis or wheezing.  Gastrointestinal: Denies dysphagia, heartburn, reflux, water brash, pain, cramps, nausea, vomiting, bloating, diarrhea, constipation, hematemesis, melena, hematochezia, jaundice or hemorrhoids Genitourinary: Denies dysuria, frequency, urgency, nocturia, hesitancy, discharge, hematuria or flank pain Musculoskeletal: Denies arthralgia, myalgia, stiffness, Jt. Swelling, pain, limp or strain/sprain. Denies  Falls. Skin: Denies puritis, rash, hives, warts, acne, eczema or change in skin lesion Neuro: No weakness, tremor, incoordination, spasms, paresthesia or pain Psychiatric: Denies confusion, memory loss or sensory loss. Denies Depression. Endocrine: Denies change in weight, skin, hair change, nocturia, and paresthesia, diabetic  polys, visual blurring or hyper / hypo glycemic episodes.  Heme/Lymph: No excessive bleeding, bruising or enlarged lymph nodes.  Physical Exam  BP (!) 158/80   Pulse 87   Temp 97.9 F (36.6 C)   Resp 16   Ht 5' 11.5" (1.816 m)   Wt 244 lb 3.2 oz (110.8 kg)   SpO2 98%   BMI 33.58 kg/m   General Appearance: over nourished and well groomed and in no apparent distress.  Eyes: PERRLA, EOMs, conjunctiva no swelling or erythema, normal fundi and vessels. Sinuses: No frontal/maxillary tenderness ENT/Mouth: EACs patent / TMs  nl. Nares clear without erythema, swelling, mucoid exudates. Oral hygiene is good. No erythema, swelling, or exudate. Tongue normal, non-obstructing. Tonsils not swollen or erythematous. Hearing normal.  Neck: Supple, thyroid not palpable. No bruits, nodes or JVD. Respiratory: Respiratory effort normal.  BS equal and clear bilateral without rales, rhonci, wheezing or stridor. Cardio: Heart sounds are normal with regular rate and rhythm and no murmurs, rubs or gallops. Peripheral pulses are normal and equal bilaterally without edema. No aortic or femoral bruits. Chest: symmetric with normal excursions and percussion.  Abdomen: Soft, with Nl bowel sounds. Nontender, no guarding, rebound, hernias, masses, or organomegaly.  Lymphatics: Non tender without lymphadenopathy.  Musculoskeletal: Full ROM all peripheral extremities, joint stability, 5/5 strength, and normal gait. Skin: Warm and dry without rashes, lesions, cyanosis, clubbing or  ecchymosis.  Neuro: Cranial nerves intact, reflexes equal bilaterally. Normal muscle tone, no cerebellar symptoms.  Sensation intact.  Pysch: Alert and oriented x 3 with normal affect, insight and judgment appropriate.   Assessment and Plan  1. Annual Preventative/Screening Exam    2. Essential hypertension  - Increase R x Benicar 40 mg qhs - Add Lasix 40 mg  qam   - EKG 12-Lead - [place], RETROPERITNL ABD,  LTD - Urinalysis, Routine w reflex microscopic - Microalbumin / creatinine urine ratio - CBC with Differential/Platelet - COMPLETE METABOLIC PANEL WITH GFR - Magnesium - TSH   3. Hyperlipidemia associated with type 2 diabetes mellitus (HCC)  - EKG 12-Lead - [place], RETROPERITNL ABD,  LTD - Lipid panel - TSH   4. Type 2 diabetes mellitus with stage 2 chronic kidney                                                   disease, without long-term current use of insulin (HCC)  - EKG 12-Lead - [place], RETROPERITNL ABD,  LTD - Urinalysis, Routine w reflex microscopic - Microalbumin / creatinine urine ratio - HM DIABETES FOOT EXAM - PR LOW EXTEMITY NEUR EXAM DOCUM - Hemoglobin A1c - Insulin, random   5. Vitamin D deficiency  - VITAMIN D 25 Hydroxy    6. Acute idiopathic gout  - Uric acid   7. OAB (overactive bladder)   8. OSA on CPAP   9. Prostate cancer screening  - PSA  10. Class 2 severe obesity due to excess calories with serious                        comorbidity and body mass index (BMI) of 38.0 to 38.9 in adult (HCC)  - TSH   11. Screening for colorectal cancer  - POC Hemoccult Bld/Stl    12. Screening-pulmonary TB   13. Screening for heart disease  - EKG  12-Lead   14. FH: hypertension  - EKG 12-Lead - [place], RETROPERITNL ABD,  LTD   15. Screening for AAA (aortic abdominal aneurysm)  - [place], RETROPERITNL ABD,  LTD   16. Fatigue, unspecified type  - Vitamin B12 - Iron, TIBC and Ferritin Panel - CBC with Differential/Platelet - TSH   17. Medication management  - Urinalysis, Routine w reflex microscopic - Microalbumin / creatinine urine ratio -  CBC with Differential/Platelet - COMPLETE METABOLIC PANEL WITH GFR - Magnesium - Lipid panel - TSH - Hemoglobin A1c - Insulin, random - VITAMIN D 25 Hydroxy         Patient was counseled in prudent diet, weight control to achieve/maintain BMI less than 25, BP monitoring, regular exercise and medications as discussed.  Discussed med effects and SE's. Routine screening labs and tests as requested with regular follow-up as recommended. Over 40 minutes of exam, counseling, chart review and high complex critical decision making was performed   Marinus Maw, MD

## 2023-05-17 ENCOUNTER — Encounter: Payer: Self-pay | Admitting: Internal Medicine

## 2023-05-17 ENCOUNTER — Encounter (HOSPITAL_COMMUNITY): Payer: Self-pay

## 2023-05-17 ENCOUNTER — Other Ambulatory Visit (HOSPITAL_COMMUNITY): Payer: Self-pay

## 2023-05-17 ENCOUNTER — Ambulatory Visit: Payer: Commercial Managed Care - PPO | Admitting: Internal Medicine

## 2023-05-17 VITALS — BP 158/80 | HR 87 | Temp 97.9°F | Resp 16 | Ht 71.5 in | Wt 244.2 lb

## 2023-05-17 DIAGNOSIS — I10 Essential (primary) hypertension: Secondary | ICD-10-CM | POA: Diagnosis not present

## 2023-05-17 DIAGNOSIS — G4733 Obstructive sleep apnea (adult) (pediatric): Secondary | ICD-10-CM

## 2023-05-17 DIAGNOSIS — Z125 Encounter for screening for malignant neoplasm of prostate: Secondary | ICD-10-CM

## 2023-05-17 DIAGNOSIS — Z79899 Other long term (current) drug therapy: Secondary | ICD-10-CM

## 2023-05-17 DIAGNOSIS — E559 Vitamin D deficiency, unspecified: Secondary | ICD-10-CM

## 2023-05-17 DIAGNOSIS — E1122 Type 2 diabetes mellitus with diabetic chronic kidney disease: Secondary | ICD-10-CM

## 2023-05-17 DIAGNOSIS — Z136 Encounter for screening for cardiovascular disorders: Secondary | ICD-10-CM | POA: Diagnosis not present

## 2023-05-17 DIAGNOSIS — Z0000 Encounter for general adult medical examination without abnormal findings: Secondary | ICD-10-CM | POA: Diagnosis not present

## 2023-05-17 DIAGNOSIS — Z0001 Encounter for general adult medical examination with abnormal findings: Secondary | ICD-10-CM

## 2023-05-17 DIAGNOSIS — N3281 Overactive bladder: Secondary | ICD-10-CM

## 2023-05-17 DIAGNOSIS — R5383 Other fatigue: Secondary | ICD-10-CM

## 2023-05-17 DIAGNOSIS — Z1211 Encounter for screening for malignant neoplasm of colon: Secondary | ICD-10-CM

## 2023-05-17 DIAGNOSIS — I700 Atherosclerosis of aorta: Secondary | ICD-10-CM

## 2023-05-17 DIAGNOSIS — M1000 Idiopathic gout, unspecified site: Secondary | ICD-10-CM

## 2023-05-17 DIAGNOSIS — Z111 Encounter for screening for respiratory tuberculosis: Secondary | ICD-10-CM

## 2023-05-17 DIAGNOSIS — E785 Hyperlipidemia, unspecified: Secondary | ICD-10-CM

## 2023-05-17 DIAGNOSIS — E66812 Obesity, class 2: Secondary | ICD-10-CM

## 2023-05-17 DIAGNOSIS — Z8249 Family history of ischemic heart disease and other diseases of the circulatory system: Secondary | ICD-10-CM

## 2023-05-17 MED ORDER — MOUNJARO 15 MG/0.5ML ~~LOC~~ SOAJ
15.0000 mg | SUBCUTANEOUS | 3 refills | Status: DC
  Filled 2023-05-17: qty 6, fill #0
  Filled 2023-05-17: qty 6, 84d supply, fill #0

## 2023-05-18 ENCOUNTER — Other Ambulatory Visit: Payer: Self-pay | Admitting: Internal Medicine

## 2023-05-18 DIAGNOSIS — R310 Gross hematuria: Secondary | ICD-10-CM

## 2023-05-18 LAB — COMPLETE METABOLIC PANEL WITH GFR
AG Ratio: 1.6 (calc) (ref 1.0–2.5)
ALT: 33 U/L (ref 9–46)
AST: 26 U/L (ref 10–35)
Albumin: 4.6 g/dL (ref 3.6–5.1)
Alkaline phosphatase (APISO): 61 U/L (ref 35–144)
BUN: 14 mg/dL (ref 7–25)
CO2: 27 mmol/L (ref 20–32)
Calcium: 10 mg/dL (ref 8.6–10.3)
Chloride: 102 mmol/L (ref 98–110)
Creat: 0.93 mg/dL (ref 0.70–1.35)
Globulin: 2.9 g/dL (ref 1.9–3.7)
Glucose, Bld: 197 mg/dL — ABNORMAL HIGH (ref 65–99)
Potassium: 4.6 mmol/L (ref 3.5–5.3)
Sodium: 138 mmol/L (ref 135–146)
Total Bilirubin: 0.8 mg/dL (ref 0.2–1.2)
Total Protein: 7.5 g/dL (ref 6.1–8.1)
eGFR: 93 mL/min/{1.73_m2} (ref >=60)

## 2023-05-18 LAB — URINALYSIS, ROUTINE W REFLEX MICROSCOPIC
Bacteria, UA: NONE SEEN /[HPF]
Bilirubin Urine: NEGATIVE
Hyaline Cast: NONE SEEN /[LPF]
Ketones, ur: NEGATIVE
Leukocytes,Ua: NEGATIVE
Nitrite: NEGATIVE
Protein, ur: NEGATIVE
Specific Gravity, Urine: 1.022 (ref 1.001–1.035)
Squamous Epithelial / HPF: NONE SEEN /[HPF] (ref <=5)
WBC, UA: NONE SEEN /[HPF] (ref 0–5)
pH: 5.5 (ref 5.0–8.0)

## 2023-05-18 LAB — IRON,TIBC AND FERRITIN PANEL
%SAT: 35 % (ref 20–48)
Ferritin: 463 ng/mL — ABNORMAL HIGH (ref 24–380)
Iron: 119 ug/dL (ref 50–180)
TIBC: 344 ug/dL (ref 250–425)

## 2023-05-18 LAB — CBC WITH DIFFERENTIAL/PLATELET
Absolute Lymphocytes: 2224 {cells}/uL (ref 850–3900)
Absolute Monocytes: 378 {cells}/uL (ref 200–950)
Basophils Absolute: 57 {cells}/uL (ref 0–200)
Basophils Relative: 0.9 %
Eosinophils Absolute: 176 {cells}/uL (ref 15–500)
Eosinophils Relative: 2.8 %
HCT: 48.9 % (ref 38.5–50.0)
Hemoglobin: 17 g/dL (ref 13.2–17.1)
MCH: 31.8 pg (ref 27.0–33.0)
MCHC: 34.8 g/dL (ref 32.0–36.0)
MCV: 91.6 fL (ref 80.0–100.0)
MPV: 9.8 fL (ref 7.5–12.5)
Monocytes Relative: 6 %
Neutro Abs: 3465 {cells}/uL (ref 1500–7800)
Neutrophils Relative %: 55 %
Platelets: 233 10*3/uL (ref 140–400)
RBC: 5.34 10*6/uL (ref 4.20–5.80)
RDW: 12.2 % (ref 11.0–15.0)
Total Lymphocyte: 35.3 %
WBC: 6.3 10*3/uL (ref 3.8–10.8)

## 2023-05-18 LAB — HEMOGLOBIN A1C
Hgb A1c MFr Bld: 8.7 %{Hb} — ABNORMAL HIGH (ref <5.7)
Mean Plasma Glucose: 203 mg/dL
eAG (mmol/L): 11.2 mmol/L

## 2023-05-18 LAB — MAGNESIUM: Magnesium: 2.1 mg/dL (ref 1.5–2.5)

## 2023-05-18 LAB — LIPID PANEL
Cholesterol: 189 mg/dL (ref <200)
HDL: 45 mg/dL (ref >=40)
LDL Cholesterol (Calc): 118 mg/dL — ABNORMAL HIGH
Non-HDL Cholesterol (Calc): 144 mg/dL — ABNORMAL HIGH (ref <130)
Total CHOL/HDL Ratio: 4.2 (calc) (ref <5.0)
Triglycerides: 147 mg/dL (ref <150)

## 2023-05-18 LAB — URIC ACID: Uric Acid, Serum: 6.6 mg/dL (ref 4.0–8.0)

## 2023-05-18 LAB — TSH: TSH: 2.05 m[IU]/L (ref 0.40–4.50)

## 2023-05-18 LAB — PSA: PSA: 0.27 ng/mL (ref <=4.00)

## 2023-05-18 LAB — MICROALBUMIN / CREATININE URINE RATIO
Creatinine, Urine: 138 mg/dL (ref 20–320)
Microalb Creat Ratio: 28 mg/g{creat} (ref <30)
Microalb, Ur: 3.9 mg/dL

## 2023-05-18 LAB — VITAMIN B12: Vitamin B-12: 668 pg/mL (ref 200–1100)

## 2023-05-18 LAB — MICROSCOPIC MESSAGE

## 2023-05-18 LAB — VITAMIN D 25 HYDROXY (VIT D DEFICIENCY, FRACTURES): Vit D, 25-Hydroxy: 32 ng/mL (ref 30–100)

## 2023-05-18 LAB — INSULIN, RANDOM: Insulin: 124.1 u[IU]/mL — ABNORMAL HIGH

## 2023-05-18 MED ORDER — ZINC 50 MG PO TABS: ORAL_TABLET | ORAL | Status: AC

## 2023-05-18 NOTE — Progress Notes (Signed)
[] [] [] [] [] [] [] [] [] [] [] [] [] [] [] [] [] [] [] [] [] [] [] [] [] [] [] [] [] [] [] [] [] [] [] [] [] [] [] [] [] ][] [] [] [] [] [] [] [] [] [] [] [] [] [] [] [] [] [] [] [] [] [] [[] [] [] [] []  [] [] [] [] [] [] [] [] [] [] [] [] [] [] [] [] [] [] [] [] [] [] [] [] [] [] [] [] [] [] [] [] [] [] [] [] [] [] [] [] [] ][] [] [] [] [] [] [] [] [] [] [] [] [] [] [] [] [] [] [] [] [] [] [[] [] [] [] []  -Test results slightly outside the reference range are not unusual. If there is anything important, I will review this with you,  otherwise it is considered normal test values.  If you have further questions,  please do not hesitate to contact me at the office or via My Chart.  [] [] [] [] [] [] [] [] [] [] [] [] [] [] [] [] [] [] [] [] [] [] [] [] [] [] [] [] [] [] [] [] [] [] [] [] [] [] [] [] [] ][] [] [] [] [] [] [] [] [] [] [] [] [] [] [] [] [] [] [] [] [] [] [[] [] [] [] []  [] [] [] [] [] [] [] [] [] [] [] [] [] [] [] [] [] [] [] [] [] [] [] [] [] [] [] [] [] [] [] [] [] [] [] [] [] [] [] [] [] ][] [] [] [] [] [] [] [] [] [] [] [] [] [] [] [] [] [] [] [] [] [] [[] [] [] [] []   -  U/A shows blood , so have asked Lab to check culture for Infection   [] [] [] [] [] [] [] [] [] [] [] [] [] [] [] [] [] [] [] [] [] [] [] [] [] [] [] [] [] [] [] [] [] [] [] [] [] [] [] [] [] ][] [] [] [] [] [] [] [] [] [] [] [] [] [] [] [] [] [] [] [] [] [] [[] [] [] [] []   -  Iron Level is OK   [] [] [] [] [] [] [] [] [] [] [] [] [] [] [] [] [] [] [] [] [] [] [] [] [] [] [] [] [] [] [] [] [] [] [] [] [] [] [] [] [] ][] [] [] [] [] [] [] [] [] [] [] [] [] [] [] [] [] [] [] [] [] [] [[] [] [] [] []   -  Glucose = 197 mg% is too high  ( Ideal  or Goal is less than 120 mg% )   -  A1c = 8.7% is 12 week average glucose is also way too high  ( Ideal or goal is less than 5.7% )   - Hopefully this will get better if you lose weight on Mounjaro !  [] [] [] [] [] [] [] [] [] [] [] [] [] [] [] [] [] [] [] [] [] [] [] [] [] [] [] [] [] [] [] [] [] [] [] [] [] [] [] [] [] ][] [] [] [] [] [] [] [] [] [] [] [] [] [] [] [] [] [] [] [] [] [] [[] [] [] [] []    -  Total  Chol =   189  - Elevated             (  Ideal  or  Goal is less than 180  !  )  & -  Bad / Dangerous LDL  Chol =  118      -  is VERY Elevated  !         (  Ideal  or  Goal is less than 70  !  )   - Not gonna get better til you stop eating Meat & animal products    - Recommend a STRICTER  low cholesterol  diet   - Cholesterol only comes from animal sources  !                                                                                            - ie. meat, dairy, egg yolks  - Eat all the vegetables you want.                                 - Avoid Meat, Avoid Meat,  Avoid Meat                                                                       - especially Red Meat - Beef AND Pork .  - Avoid cheese & dairy - milk & ice cream.     - Cheese is the most concentrated form of trans-fats which                                                                        is the worst  thing to clog up our arteries.    - Veggie cheese is OK which can be found in the fresh                                        produce section at [employer] or [employer] or [employer]  [] [] [] [] [] [] [] [] [] [] [] [] [] [] [] [] [] [] [] [] [] [] [] [] [] [] [] [] [] [] [] [] [] [] [] [] [] [] [] [] [] ][] [] [] [] [] [] [] [] [] [] [] [] [] [] [] [] [] [] [] [] [] [] [[] [] [] [] []   -  Insulin = 124.1  ( normal is less than 20 !    And shows insulin resistance - a sign of early diabetes and   associated with a 300 % greater risk for   heart attacks, strokes, cancer & Alzheimer type vascular dementia    - All this can be cured  and prevented with losing weight   - get Dr Francis Dowse Fuhrman's book 'the End of Diabetes" and                                                                                "the End of Dieting"  - and add many years of good health to your life.  [] [] [] [] [] [] [] [] [] [] [] [] [] [] [] [] [] [] [] [] [] [] [] [] [] [] [] [] [] [] [] [] [] [] [] [] [] [] [] [] [] ][] [] [] [] [] [] [] [] [] [] [] [] [] [] [] [] [] [] [] [] [] [] [[] [] [] [] []   -  Vitamin B12 is Normal - Great  !  [] [] [] [] [] [] [] [] [] [] [] [] [] [] [] [] [] [] [] [] [] [] [] [] [] [] [] [] [] [] [] [] [] [] [] [] [] [] [] [] [] ][] [] [] [] [] [] [] [] [] [] [] [] [] [] [] [] [] [] [] [] [] [] [[] [] [] [] []   -  PSA low - No Prostate Cancer - Great ! [] [] [] [] [] [] [] [] [] [] [] [] [] [] [] [] [] [] [] [] [] [] [] [] [] [] [] [] [] [] [] [] [] [] [] [] [] [] [] [] [] ][] [] [] [] [] [] [] [] [] [] [] [] [] [] [] [] [] [] [] [] [] [] [[] [] [] [] []   -   Uric Acid  / Gout test is Normal & OK   - Continue your Allopurinol   [] [] [] [] [] [] [] [] [] [] [] [] [] [] [] [] [] [] [] [] [] [] [] [] [] [] [] [] [] [] [] [] [] [] [] [] [] [] [] [] [] ][] [] [] [] [] [] [] [] [] [] [] [] [] [] [] [] [] [] [] [] [] [] [[] [] [] [] []   -  Vitamin D = 32 - EXTREMELY & DANGEROUSLY LOW - as usual                PLEASE Take Vitamin D 10, 000 units as repeatedly recommended in the past   [] [] [] [] [] [] [] [] [] [] [] [] [] [] [] [] [] [] [] [] [] [] [] [] [] [] [] [] [] [] [] [] [] [] [] [] [] [] [] [] [] ][] [] [] [] [] [] [] [] [] [] [] [] [] [] [] [] [] [] [] [] [] [] [[] [] [] [] []   -  All Else - CBC - Kidneys - Electrolytes - Liver - Magnesium & Thyroid    - all  Normal / OK [] [] [] [] [] [] [] [] [] [] [] [] [] [] [] [] [] [] [] [] [] [] [] [] [] [] [] [] [] [] [] [] [] [] [] [] [] [] [] [] [] ][] [] [] [] [] [] [] [] [] [] [] [] [] [] [] [] [] [] [] [] [] [] [[] [] [] [] []

## 2023-05-20 ENCOUNTER — Other Ambulatory Visit: Payer: Self-pay | Admitting: Internal Medicine

## 2023-05-20 DIAGNOSIS — R310 Gross hematuria: Secondary | ICD-10-CM

## 2023-05-20 LAB — URINE CULTURE
MICRO NUMBER:: 15877877
Result:: NO GROWTH
SPECIMEN QUALITY:: ADEQUATE

## 2023-05-20 NOTE — Progress Notes (Signed)
[][][][][][][][][][][][][][][][][][][][][][][][][][][][][][][][][][][][][][][][][]][][][][][][][][][][][][][][][][][][][][][][][[][][][][]  -   Urine culture was Negative = No infection to explain Blood in Urine, So                                                                          referral made for Urology Consultation   [] [] [] [] [] [] [] [] [] [] [] [] [] [] [] [] [] [] [] [] [] [] [] [] [] [] [] [] [] [] [] [] [] [] [] [] [] [] [] [] [] ][] [] [] [] [] [] [] [] [] [] [] [] [] [] [] [] [] [] [] [] [] [] [[] [] [] [] []

## 2023-05-22 ENCOUNTER — Other Ambulatory Visit: Payer: Self-pay | Admitting: Internal Medicine

## 2023-05-22 DIAGNOSIS — I10 Essential (primary) hypertension: Secondary | ICD-10-CM

## 2023-05-22 MED ORDER — OLMESARTAN MEDOXOMIL 40 MG PO TABS: ORAL_TABLET | ORAL | 3 refills | Status: DC

## 2023-05-22 MED ORDER — FUROSEMIDE 40 MG PO TABS: ORAL_TABLET | ORAL | 3 refills | Status: DC

## 2023-05-30 ENCOUNTER — Telehealth: Payer: Self-pay

## 2023-05-30 NOTE — Telephone Encounter (Signed)
 PA for [facility] completed

## 2023-06-05 NOTE — Patient Instructions (Signed)
Hypertension High blood pressure (hypertension) is when the force of blood pumping through the arteries is too strong. The arteries are the blood vessels that carry blood from the heart throughout the body. Hypertension forces the heart to work harder to pump blood and may cause arteries to become narrow or stiff. Untreated or uncontrolled hypertension can lead to a heart attack, heart failure, a stroke, kidney disease, and other problems. A blood pressure reading consists of a higher number over a lower number. Ideally, your blood pressure should be below 120/80. The first ("top") number is called the systolic pressure. It is a measure of the pressure in your arteries as your heart beats. The second ("bottom") number is called the diastolic pressure. It is a measure of the pressure in your arteries as the heart relaxes. What are the causes? The exact cause of this condition is not known. There are some conditions that result in high blood pressure. What increases the risk? Certain factors may make you more likely to develop high blood pressure. Some of these risk factors are under your control, including: Smoking. Not getting enough exercise or physical activity. Being overweight. Having too much fat, sugar, calories, or salt (sodium) in your diet. Drinking too much alcohol. Other risk factors include: Having a personal history of heart disease, diabetes, high cholesterol, or kidney disease. Stress. Having a family history of high blood pressure and high cholesterol. Having obstructive sleep apnea. Age. The risk increases with age. What are the signs or symptoms? High blood pressure may not cause symptoms. Very high blood pressure (hypertensive crisis) may cause: Headache. Fast or irregular heartbeats (palpitations). Shortness of breath. Nosebleed. Nausea and vomiting. Vision changes. Severe chest pain, dizziness, and seizures. How is this diagnosed? This condition is diagnosed by  measuring your blood pressure while you are seated, with your arm resting on a flat surface, your legs uncrossed, and your feet flat on the floor. The cuff of the blood pressure monitor will be placed directly against the skin of your upper arm at the level of your heart. Blood pressure should be measured at least twice using the same arm. Certain conditions can cause a difference in blood pressure between your right and left arms. If you have a high blood pressure reading during one visit or you have normal blood pressure with other risk factors, you may be asked to: Return on a different day to have your blood pressure checked again. Monitor your blood pressure at home for 1 week or longer. If you are diagnosed with hypertension, you may have other blood or imaging tests to help your health care provider understand your overall risk for other conditions. How is this treated? This condition is treated by making healthy lifestyle changes, such as eating healthy foods, exercising more, and reducing your alcohol intake. You may be referred for counseling on a healthy diet and physical activity. Your health care provider may prescribe medicine if lifestyle changes are not enough to get your blood pressure under control and if: Your systolic blood pressure is above 130. Your diastolic blood pressure is above 80. Your personal target blood pressure may vary depending on your medical conditions, your age, and other factors. Follow these instructions at home: Eating and drinking Eat a diet that is high in fiber and potassium, and low in sodium, added sugar, and fat. An example of this eating plan is called the DASH diet. DASH stands for Dietary Approaches to Stop Hypertension. To eat this way: Eat plenty of  fresh fruits and vegetables. Try to fill one half of your plate at each meal with fruits and vegetables. Eat whole grains, such as whole-wheat pasta, brown rice, or whole-grain bread. Fill about one  fourth of your plate with whole grains. Eat or drink low-fat dairy products, such as skim milk or low-fat yogurt. Avoid fatty cuts of meat, processed or cured meats, and poultry with skin. Fill about one fourth of your plate with lean proteins, such as fish, chicken without skin, beans, eggs, or tofu. Avoid pre-made and processed foods. These tend to be higher in sodium, added sugar, and fat. Reduce your daily sodium intake. Many people with hypertension should eat less than 1,500 mg of sodium a day. Do not drink alcohol if: Your health care provider tells you not to drink. You are pregnant, may be pregnant, or are planning to become pregnant. If you drink alcohol: Limit how much you have to: 0-1 drink a day for women. 0-2 drinks a day for men. Know how much alcohol is in your drink. In the [place] one drink equals one 12 oz bottle of beer (355 mL), one 5 oz glass of wine (148 mL), or one 1 oz glass of hard liquor (44 mL). Lifestyle Work with your health care provider to maintain a healthy body weight or to lose weight. Ask what an ideal weight is for you. Get at least 30 minutes of exercise that causes your heart to beat faster (aerobic exercise) most days of the week. Activities may include walking, swimming, or biking. Include exercise to strengthen your muscles (resistance exercise), such as Pilates or lifting weights, as part of your weekly exercise routine. Try to do these types of exercises for 30 minutes at least 3 days a week. Do not use any products that contain nicotine or tobacco. These products include cigarettes, chewing tobacco, and vaping devices, such as e-cigarettes. If you need help quitting, ask your health care provider. Monitor your blood pressure at home as told by your health care provider. Keep all follow-up visits. This is important. Medicines Take over-the-counter and prescription medicines only as told by your health care provider. Follow directions carefully. Blood  pressure medicines must be taken as prescribed. Do not skip doses of blood pressure medicine. Doing this puts you at risk for problems and can make the medicine less effective. Ask your health care provider about side effects or reactions to medicines that you should watch for. Contact a health care provider if you: Think you are having a reaction to a medicine you are taking. Have headaches that keep coming back (recurring). Feel dizzy. Have swelling in your ankles. Have trouble with your vision. Get help right away if you: Develop a severe headache or confusion. Have unusual weakness or numbness. Feel faint. Have severe pain in your chest or abdomen. Vomit repeatedly. Have trouble breathing. These symptoms may be an emergency. Get help right away. Call 911. Do not wait to see if the symptoms will go away. Do not drive yourself to the hospital. Summary Hypertension is when the force of blood pumping through your arteries is too strong. If this condition is not controlled, it may put you at risk for serious complications. Your personal target blood pressure may vary depending on your medical conditions, your age, and other factors. For most people, a normal blood pressure is less than 120/80. Hypertension is treated with lifestyle changes, medicines, or a combination of both. Lifestyle changes include losing weight, eating a healthy, low-sodium diet, exercising  more, and limiting alcohol.

## 2023-06-05 NOTE — Progress Notes (Signed)
 [place]      ADULT   &   ADOLESCENT      INTERNAL MEDICINE  Elsie Richards, M.D.          Lonell Rous, ANP        Bascom Necessary, FNP  [facility] [address]  [place], [place]. [address] Telephone [phone] Telefax [phone]    Future Appointments  Date Time Provider Department  06/06/2023  9:30 AM Richards Elsie, MD [facility]  08/18/2023                3 mo & wellness 10:30 AM Jude Lonell BRAVO, NP [facility]  11/21/2023                6 mo ov  9:30 AM Richards Elsie, MD [facility]  02/21/2024                 9 mo ov  9:30 AM Jude Lonell BRAVO, NP [facility]  05/24/2024                 cpe 10:00 AM Richards Elsie, MD [facility]    History of Present Illness:     This very nice 63 y.o. MWM  with  HTN, HLD, T2_NIDDM and Vitamin D  Deficiency was in recently on Dec 18 for his CPE  and his BP was elevated  at 158/80  & rechecked at 177/98. He was advised to increase his Olmesartan  20 to 40 mg & Lasix  40 mg was added. He returns today for F/U.  He admits not checking BPs at home .  Patient does c/o burning dysthesias of feet & soles at night.    Current Outpatient Medications on File Prior to Visit  Medication Sig   allopurinol  300 MG tablet Take 1 tablet Daily    VITAMIN D   Take 1 capsule  daily.   cyclobenzaprine  5 MG tablet Take 1 tablet 3 times daily as needed    furosemide  40 MG tablet Take 1 tablet  every Morning   glipizide   5 MG tablet Take  1/2 to 1 tablet  2 x /day  with Lunch & Supper     ibuprofen  200 MG tablet Take  daily as needed (pain).   mirabegron  ER  25 MG TB24 tablet Take 1 tablet  Daily    olmesartan   40 MG tablet Take 1 tablet  at Night   rosuvastatin  10 MG tablet Take  1 tablet  Daily     MOUNJARO15 MG/0.5ML Pen Inject 15 mg into the skin every 7 to 12 days for diabetes.   Zinc  50 MG TABS Take 1 tablet Daily     Allergies  Allergen Reactions   Codeine  Itching   Nsaids Other (See Comments)    Bleeding  ulcer 2013     Problem list He has Hypertension; Hyperlipidemia associated with type 2 diabetes mellitus (HCC); Vitamin D  deficiency; Medication management; Testosterone  deficiency; Obesity (BMI 30.0-34.9); GERD; T2_NIDDM (HCC); Gout; CKD stage 2 due to type 2 diabetes mellitus (HCC); History of adenomatous polyp of colon; Hepatic steatosis; Asymptomatic gallstones; Syncope; and AKI (acute kidney injury) (HCC) on their problem list.   Observations/Objective:  BP 123/81   Pulse 84   Temp 98 F (36.7 C)   Resp 16   Ht 5' 11.5 (1.816 m)   Wt 240 lb (108.9 kg)   SpO2 98%   BMI 33.01 kg/m   HEENT - WNL. Neck - supple.  Chest - Clear equal BS. Cor -  Nl HS. RRR w/o sig MGR. PP 1(+). No edema. MS- FROM w/o deformities.  Gait Nl. Neuro -  Nl w/o focal abnormalities.   Assessment and Plan:   1. Labile hypertension (Primary)  - CONTINUE MEDS SAME   2. Diabetic neuropathy, painful (HCC)  - gabapentin  (NEURONTIN ) 300 MG capsule;  Take  1 capsule  1 to 2 hours  before Bedtime for Sleep & Diabetic Neuropathy   Dispense: 90 capsule; Refill: 3   3. Type 2 diabetes mellitus with stage 2 chronic kidney disease, without long-term current use of insulin  (HCC)  - Refill  Lancets (FREESTYLE)   Follow Up Instructions:        I discussed the assessment and treatment plan with the patient. The patient was provided an opportunity to ask questions and all were answered. The patient agreed with the plan and demonstrated an understanding of the instructions.       The patient was advised to call back or seek an in-person evaluation if the symptoms worsen or if the condition fails to improve as anticipated.    Elsie JONETTA Richards, MD

## 2023-06-06 ENCOUNTER — Encounter: Payer: Self-pay | Admitting: Internal Medicine

## 2023-06-06 ENCOUNTER — Ambulatory Visit: Payer: Commercial Managed Care - PPO | Admitting: Internal Medicine

## 2023-06-06 VITALS — BP 123/81 | HR 84 | Temp 98.0°F | Resp 16 | Ht 71.5 in | Wt 240.0 lb

## 2023-06-06 DIAGNOSIS — N182 Chronic kidney disease, stage 2 (mild): Secondary | ICD-10-CM | POA: Diagnosis not present

## 2023-06-06 DIAGNOSIS — R0989 Other specified symptoms and signs involving the circulatory and respiratory systems: Secondary | ICD-10-CM | POA: Diagnosis not present

## 2023-06-06 DIAGNOSIS — E1140 Type 2 diabetes mellitus with diabetic neuropathy, unspecified: Secondary | ICD-10-CM

## 2023-06-06 DIAGNOSIS — E1122 Type 2 diabetes mellitus with diabetic chronic kidney disease: Secondary | ICD-10-CM

## 2023-06-06 MED ORDER — FREESTYLE LANCETS MISC: 3 refills | Status: AC

## 2023-06-06 MED ORDER — GABAPENTIN 300 MG PO CAPS: ORAL_CAPSULE | ORAL | 3 refills | Status: AC

## 2023-07-04 ENCOUNTER — Telehealth: Payer: Self-pay

## 2023-07-04 NOTE — Telephone Encounter (Signed)
PA completed for Mounjaro, 15mg 

## 2023-07-24 ENCOUNTER — Other Ambulatory Visit: Payer: Self-pay

## 2023-07-24 DIAGNOSIS — E1122 Type 2 diabetes mellitus with diabetic chronic kidney disease: Secondary | ICD-10-CM

## 2023-07-24 DIAGNOSIS — M25561 Pain in right knee: Secondary | ICD-10-CM

## 2023-07-25 MED ORDER — CYCLOBENZAPRINE HCL 5 MG PO TABS: 5.0000 mg | ORAL_TABLET | Freq: Three times a day (TID) | ORAL | 0 refills | Status: AC | PRN

## 2023-07-25 MED ORDER — MOUNJARO 15 MG/0.5ML ~~LOC~~ SOAJ: 15.0000 mg | SUBCUTANEOUS | 3 refills | Status: DC

## 2023-08-18 ENCOUNTER — Ambulatory Visit: Payer: Commercial Managed Care - PPO | Admitting: Nurse Practitioner

## 2023-08-22 ENCOUNTER — Ambulatory Visit: Payer: Commercial Managed Care - PPO | Admitting: Nurse Practitioner

## 2023-10-05 ENCOUNTER — Encounter: Payer: Self-pay | Admitting: Emergency Medicine

## 2023-10-05 ENCOUNTER — Ambulatory Visit: Payer: Commercial Managed Care - PPO | Admitting: Emergency Medicine

## 2023-10-05 VITALS — BP 138/82 | HR 70 | Temp 97.5°F | Ht 71.5 in | Wt 245.0 lb

## 2023-10-05 DIAGNOSIS — E1169 Type 2 diabetes mellitus with other specified complication: Secondary | ICD-10-CM

## 2023-10-05 DIAGNOSIS — Z7689 Persons encountering health services in other specified circumstances: Secondary | ICD-10-CM | POA: Diagnosis not present

## 2023-10-05 DIAGNOSIS — M1000 Idiopathic gout, unspecified site: Secondary | ICD-10-CM

## 2023-10-05 DIAGNOSIS — E785 Hyperlipidemia, unspecified: Secondary | ICD-10-CM

## 2023-10-05 DIAGNOSIS — Z8739 Personal history of other diseases of the musculoskeletal system and connective tissue: Secondary | ICD-10-CM

## 2023-10-05 DIAGNOSIS — E1159 Type 2 diabetes mellitus with other circulatory complications: Secondary | ICD-10-CM | POA: Diagnosis not present

## 2023-10-05 DIAGNOSIS — I152 Hypertension secondary to endocrine disorders: Secondary | ICD-10-CM

## 2023-10-05 DIAGNOSIS — I10 Essential (primary) hypertension: Secondary | ICD-10-CM

## 2023-10-05 DIAGNOSIS — E1122 Type 2 diabetes mellitus with diabetic chronic kidney disease: Secondary | ICD-10-CM

## 2023-10-05 DIAGNOSIS — Z7985 Long-term (current) use of injectable non-insulin antidiabetic drugs: Secondary | ICD-10-CM | POA: Diagnosis not present

## 2023-10-05 DIAGNOSIS — N182 Chronic kidney disease, stage 2 (mild): Secondary | ICD-10-CM

## 2023-10-05 LAB — MICROALBUMIN / CREATININE URINE RATIO
Creatinine,U: 28.3 mg/dL
Microalb Creat Ratio: UNDETERMINED mg/g (ref 0.0–30.0)
Microalb, Ur: <0.7 mg/dL

## 2023-10-05 LAB — LIPID PANEL
Cholesterol: 182 mg/dL (ref 0–200)
HDL: 39.3 mg/dL (ref >39.00)
LDL Cholesterol: 105 mg/dL — ABNORMAL HIGH (ref 0–99)
NonHDL: 142.54
Total CHOL/HDL Ratio: 5
Triglycerides: 186 mg/dL — ABNORMAL HIGH (ref 0.0–149.0)
VLDL: 37.2 mg/dL (ref 0.0–40.0)

## 2023-10-05 LAB — COMPREHENSIVE METABOLIC PANEL WITH GFR
ALT: 27 U/L (ref 0–53)
AST: 22 U/L (ref 0–37)
Albumin: 4.3 g/dL (ref 3.5–5.2)
Alkaline Phosphatase: 51 U/L (ref 39–117)
BUN: 18 mg/dL (ref 6–23)
CO2: 28 meq/L (ref 19–32)
Calcium: 9.3 mg/dL (ref 8.4–10.5)
Chloride: 102 meq/L (ref 96–112)
Creatinine, Ser: 1.13 mg/dL (ref 0.40–1.50)
GFR: 69.64 mL/min (ref >60.00)
Glucose, Bld: 270 mg/dL — ABNORMAL HIGH (ref 70–99)
Potassium: 4.2 meq/L (ref 3.5–5.1)
Sodium: 138 meq/L (ref 135–145)
Total Bilirubin: 0.5 mg/dL (ref 0.2–1.2)
Total Protein: 7.4 g/dL (ref 6.0–8.3)

## 2023-10-05 LAB — CBC WITH DIFFERENTIAL/PLATELET
Basophils Absolute: 0 10*3/uL (ref 0.0–0.1)
Basophils Relative: 0.7 % (ref 0.0–3.0)
Eosinophils Absolute: 0.3 10*3/uL (ref 0.0–0.7)
Eosinophils Relative: 5.3 % — ABNORMAL HIGH (ref 0.0–5.0)
HCT: 45.2 % (ref 39.0–52.0)
Hemoglobin: 15.5 g/dL (ref 13.0–17.0)
Lymphocytes Relative: 34.2 % (ref 12.0–46.0)
Lymphs Abs: 2.2 10*3/uL (ref 0.7–4.0)
MCHC: 34.2 g/dL (ref 30.0–36.0)
MCV: 92 fl (ref 78.0–100.0)
Monocytes Absolute: 0.4 10*3/uL (ref 0.1–1.0)
Monocytes Relative: 6.8 % (ref 3.0–12.0)
Neutro Abs: 3.4 10*3/uL (ref 1.4–7.7)
Neutrophils Relative %: 53 % (ref 43.0–77.0)
Platelets: 198 10*3/uL (ref 150.0–400.0)
RBC: 4.92 Mil/uL (ref 4.22–5.81)
RDW: 12.8 % (ref 11.5–15.5)
WBC: 6.4 10*3/uL (ref 4.0–10.5)

## 2023-10-05 LAB — URIC ACID: Uric Acid, Serum: 8.9 mg/dL — ABNORMAL HIGH (ref 4.0–7.8)

## 2023-10-05 LAB — HEMOGLOBIN A1C: Hgb A1c MFr Bld: 6.3 % (ref 4.6–6.5)

## 2023-10-05 MED ORDER — ROSUVASTATIN CALCIUM 10 MG PO TABS: ORAL_TABLET | ORAL | 3 refills | Status: DC

## 2023-10-05 MED ORDER — OLMESARTAN MEDOXOMIL 40 MG PO TABS
ORAL_TABLET | ORAL | 3 refills | Status: AC
Start: 2023-10-05 — End: ?

## 2023-10-05 MED ORDER — TIRZEPATIDE 10 MG/0.5ML ~~LOC~~ SOAJ: 10.0000 mg | SUBCUTANEOUS | 3 refills | Status: DC

## 2023-10-05 NOTE — Assessment & Plan Note (Addendum)
 History of gout.  No recent flareups. Not taking allopurinol  Uric acid level done today

## 2023-10-05 NOTE — Assessment & Plan Note (Signed)
 Blood work done today including lipid profile and hemoglobin A1c Restart Mounjaro  Continue rosuvastatin  10 mg daily.  Has not been taking it. Cardiovascular risks associated with dyslipidemia and diabetes discussed Diet and nutrition discussed Follow-up in 3 months

## 2023-10-05 NOTE — Patient Instructions (Signed)
 Stop furosemide  Start olmesartan  and rosuvastatin  Start Mounjaro  Blood work today Follow-up in 3 months  Diabetes Mellitus and Nutrition, Adult When you have diabetes, or diabetes mellitus, it is very important to have healthy eating habits because your blood sugar (glucose) levels are greatly affected by what you eat and drink. Eating healthy foods in the right amounts, at about the same times every day, can help you: Manage your blood glucose. Lower your risk of heart disease. Improve your blood pressure. Reach or maintain a healthy weight. What can affect my meal plan? Every person with diabetes is different, and each person has different needs for a meal plan. Your health care provider may recommend that you work with a dietitian to make a meal plan that is best for you. Your meal plan may vary depending on factors such as: The calories you need. The medicines you take. Your weight. Your blood glucose, blood pressure, and cholesterol levels. Your activity level. Other health conditions you have, such as heart or kidney disease. How do carbohydrates affect me? Carbohydrates, also called carbs, affect your blood glucose level more than any other type of food. Eating carbs raises the amount of glucose in your blood. It is important to know how many carbs you can safely have in each meal. This is different for every person. Your dietitian can help you calculate how many carbs you should have at each meal and for each snack. How does alcohol affect me? Alcohol can cause a decrease in blood glucose (hypoglycemia), especially if you use insulin  or take certain diabetes medicines by mouth. Hypoglycemia can be a life-threatening condition. Symptoms of hypoglycemia, such as sleepiness, dizziness, and confusion, are similar to symptoms of having too much alcohol. Do not drink alcohol if: Your health care provider tells you not to drink. You are pregnant, may be pregnant, or are planning to become  pregnant. If you drink alcohol: Limit how much you have to: 0-1 drink a day for women. 0-2 drinks a day for men. Know how much alcohol is in your drink. In the [place] one drink equals one 12 oz bottle of beer (355 mL), one 5 oz glass of wine (148 mL), or one 1 oz glass of hard liquor (44 mL). Keep yourself hydrated with water, diet soda, or unsweetened iced tea. Keep in mind that regular soda, juice, and other mixers may contain a lot of sugar and must be counted as carbs. What are tips for following this plan?  Reading food labels Start by checking the serving size on the Nutrition Facts label of packaged foods and drinks. The number of calories and the amount of carbs, fats, and other nutrients listed on the label are based on one serving of the item. Many items contain more than one serving per package. Check the total grams (g) of carbs in one serving. Check the number of grams of saturated fats and trans fats in one serving. Choose foods that have a low amount or none of these fats. Check the number of milligrams (mg) of salt (sodium) in one serving. Most people should limit total sodium intake to less than 2,300 mg per day. Always check the nutrition information of foods labeled as "low-fat" or "nonfat." These foods may be higher in added sugar or refined carbs and should be avoided. Talk to your dietitian to identify your daily goals for nutrients listed on the label. Shopping Avoid buying canned, pre-made, or processed foods. These foods tend to be high in fat, sodium,  and added sugar. Shop around the outside edge of the grocery store. This is where you will most often find fresh fruits and vegetables, bulk grains, fresh meats, and fresh dairy products. Cooking Use low-heat cooking methods, such as baking, instead of high-heat cooking methods, such as deep frying. Cook using healthy oils, such as olive, canola, or sunflower oil. Avoid cooking with butter, cream, or high-fat meats. Meal  planning Eat meals and snacks regularly, preferably at the same times every day. Avoid going long periods of time without eating. Eat foods that are high in fiber, such as fresh fruits, vegetables, beans, and whole grains. Eat 4-6 oz (112-168 g) of lean protein each day, such as lean meat, chicken, fish, eggs, or tofu. One ounce (oz) (28 g) of lean protein is equal to: 1 oz (28 g) of meat, chicken, or fish. 1 egg.  cup (62 g) of tofu. Eat some foods each day that contain healthy fats, such as avocado, nuts, seeds, and fish. What foods should I eat? Fruits Berries. Apples. Oranges. Peaches. Apricots. Plums. Grapes. Mangoes. Papayas. Pomegranates. Kiwi. Cherries. Vegetables Leafy greens, including lettuce, spinach, kale, chard, collard greens, mustard greens, and cabbage. Beets. Cauliflower. Broccoli. Carrots. Green beans. Tomatoes. Peppers. Onions. Cucumbers. Brussels sprouts. Grains Whole grains, such as whole-wheat or whole-grain bread, crackers, tortillas, cereal, and pasta. Unsweetened oatmeal. Quinoa. Brown or wild rice. Meats and other proteins Seafood. Poultry without skin. Lean cuts of poultry and beef. Tofu. Nuts. Seeds. Dairy Low-fat or fat-free dairy products such as milk, yogurt, and cheese. The items listed above may not be a complete list of foods and beverages you can eat and drink. Contact a dietitian for more information. What foods should I avoid? Fruits Fruits canned with syrup. Vegetables Canned vegetables. Frozen vegetables with butter or cream sauce. Grains Refined white flour and flour products such as bread, pasta, snack foods, and cereals. Avoid all processed foods. Meats and other proteins Fatty cuts of meat. Poultry with skin. Breaded or fried meats. Processed meat. Avoid saturated fats. Dairy Full-fat yogurt, cheese, or milk. Beverages Sweetened drinks, such as soda or iced tea. The items listed above may not be a complete list of foods and beverages you  should avoid. Contact a dietitian for more information. Questions to ask a health care provider Do I need to meet with a certified diabetes care and education specialist? Do I need to meet with a dietitian? What number can I call if I have questions? When are the best times to check my blood glucose? Where to find more information: American Diabetes Association: diabetes.org Academy of Nutrition and Dietetics: eatright.[employer] of Diabetes and Digestive and Kidney Diseases: StageSync.si Association of Diabetes Care & Education Specialists: diabeteseducator.org Summary It is important to have healthy eating habits because your blood sugar (glucose) levels are greatly affected by what you eat and drink. It is important to use alcohol carefully. A healthy meal plan will help you manage your blood glucose and lower your risk of heart disease. Your health care provider may recommend that you work with a dietitian to make a meal plan that is best for you. This information is not intended to replace advice given to you by your health care provider. Make sure you discuss any questions you have with your health care provider. Document Revised: 12/17/2019 Document Reviewed: 12/18/2019 Elsevier Patient Education  2024 [employer].

## 2023-10-05 NOTE — Assessment & Plan Note (Signed)
 Advised to stay well-hydrated and avoid NSAIDs May benefit from SGLT inhibitor Blood work done today

## 2023-10-05 NOTE — Progress Notes (Signed)
 Paul Warner 63 y.o.   Chief Complaint  Patient presents with   Establish Care    [name]    HISTORY OF PRESENT ILLNESS: This is a 64 y.o. male first visit to this office, wants to establish care with a new provider. History of diabetes and hypertension Has no complaints or medical concerns today.  HPI   Prior to Admission medications   Medication Sig Start Date End Date Taking? Authorizing Provider  allopurinol  (ZYLOPRIM ) 300 MG tablet Take 1 tablet Daily to Prevent Gout 10/29/21  Yes Vangie Genet, MD  Blood Glucose Monitoring Suppl (FREESTYLE LITE) DEVI 1 Device by Does not apply route 3 (three) times daily. 09/05/18  Yes Lorraine Bureau, NP  Cholecalciferol (VITAMIN D  PO) Take 1 capsule by mouth daily.   Yes [provider]  cyclobenzaprine  (FLEXERIL ) 5 MG tablet Take 1 tablet (5 mg total) by mouth 3 (three) times daily as needed for muscle spasms. 07/25/23  Yes Webb, Padonda B, FNP  furosemide  (LASIX ) 40 MG tablet Take 1 tablet  every Morning  for BP and Fluid Retention / Ankle Swelling                              /                                                                   TAKE                                         BY                                                 MOUTH 05/22/23  Yes Vangie Genet, MD  gabapentin  (NEURONTIN ) 300 MG capsule Take  1 capsule  1 to 2 hours  before Bedtime for Sleep & Diabetic Neuropathy 06/06/23  Yes Vangie Genet, MD  glipiZIDE  (GLUCOTROL ) 5 MG tablet Take  1/2 to 1 tablet  2 x /day  with Lunch & Supper  if Glucose is over 200 mg% 06/10/22  Yes Vangie Genet, MD  glucose blood (FREESTYLE LITE) test strip Check blood sugar 1 to 2 times daily. [id number] 08/17/20  Yes Vangie Genet, MD  ibuprofen (ADVIL) 200 MG tablet Take 200 mg by mouth daily as needed (pain).   Yes [provider]  Lancets (FREESTYLE) lancets Check  Blood Sugar  Daily 06/06/23  Yes Vangie Genet, MD  mirabegron  ER  (MYRBETRIQ ) 25 MG TB24 tablet Take 1 tablet  Daily for Bladder control 04/05/23  Yes Vangie Genet, MD  olmesartan  (BENICAR ) 40 MG tablet Take 1 tablet  at Night for BP                                  /  TAKE                                         BY                                                 MOUTH 05/22/23  Yes Vangie Genet, MD  rosuvastatin  (CRESTOR ) 10 MG tablet Take  1 tablet  Daily  for Cholesterol 05/26/22  Yes Wilkinson, Dana E, FNP  Zinc  50 MG TABS Take 1 tablet Daily 05/18/23  Yes Vangie Genet, MD  tirzepatide  (MOUNJARO ) 15 MG/0.5ML Pen Inject 15 mg into the skin every 7 to 12 days for diabetes. Patient not taking: Reported on 10/05/2023 07/25/23   Jarold Merlin B, FNP    Allergies  Allergen Reactions   Codeine  Itching   Nsaids Other (See Comments)    Bleeding ulcer 2013    Patient Active Problem List   Diagnosis Date Noted   Hepatic steatosis 12/25/2019   Asymptomatic gallstones 12/25/2019   History of adenomatous polyp of colon 10/07/2019   CKD stage 2 due to type 2 diabetes mellitus (HCC) 08/16/2017   Gout 09/23/2015   GERD 03/11/2015   T2_NIDDM (HCC) 03/11/2015   Obesity (BMI 30.0-34.9) 04/22/2014   Vitamin D  deficiency 10/01/2013   Testosterone  deficiency 10/01/2013   Hypertension    Hyperlipidemia associated with type 2 diabetes mellitus (HCC)     Past Medical History:  Diagnosis Date   Allergy    seasonal   Arthritis    Blood transfusion 09/2011   2 units   Diabetes mellitus without complication (HCC)    History of adenomatous polyp of colon 10/07/2019   Due 10/03/2022 per Dr. Elvin Hammer   Hyperlipidemia    Hypertension    Renal disorder    Stomach ulcer    Syncope     Past Surgical History:  Procedure Laterality Date   COLONOSCOPY     ESOPHAGOGASTRODUODENOSCOPY  10/26/2011   Procedure: ESOPHAGOGASTRODUODENOSCOPY (EGD);  Surgeon: Tobin Forts, MD;  Location: [facility] ENDOSCOPY;   Service: Endoscopy;  Laterality: N/A;   FRACTURE SURGERY  broken collar bone   POLYPECTOMY     UPPER GASTROINTESTINAL ENDOSCOPY      Social History   Socioeconomic History   Marital status: Married    Spouse name: Paul Warner   Number of children: 1   Years of education: Not on file   Highest education level: Some college, no degree  Occupational History   Not on file  Tobacco Use   Smoking status: Never   Smokeless tobacco: Never  Vaping Use   Vaping status: Never Used  Substance and Sexual Activity   Alcohol use: Yes    Alcohol/week: 1.0 standard drink of alcohol    Types: 1 Cans of beer per week    Comment: occasionally    Drug use: No   Sexual activity: Yes    Birth control/protection: None  Other Topics Concern   Not on file  Social History Narrative   Not on file   Social Drivers of Health   Financial Resource Strain: Not on file  Food Insecurity: Not on file  Transportation Needs: Not on file  Physical Activity: Not on file  Stress: Not on file  Social Connections: Not on  file  Intimate Partner Violence: Not on file    Family History  Adopted: Yes  Problem Relation Age of Onset   Breast cancer Mother    Colon cancer Neg Hx    Colon polyps Neg Hx    Esophageal cancer Neg Hx    Rectal cancer Neg Hx    Stomach cancer Neg Hx      Review of Systems  Constitutional: Negative.  Negative for chills and fever.  HENT: Negative.  Negative for congestion and sore throat.   Respiratory: Negative.  Negative for cough and shortness of breath.   Cardiovascular: Negative.  Negative for chest pain and palpitations.  Gastrointestinal:  Negative for abdominal pain, heartburn, nausea and vomiting.  Genitourinary: Negative.  Negative for dysuria and hematuria.  Skin: Negative.  Negative for rash.  Neurological:  Negative for dizziness and headaches.  All other systems reviewed and are negative.   Today's Vitals   10/05/23 0751  BP: 138/82  Pulse: 70  Temp:  (!) 97.5 F (36.4 C)  TempSrc: Oral  SpO2: 96%  Weight: 245 lb (111.1 kg)  Height: 5' 11.5" (1.816 m)   Body mass index is 33.69 kg/m.   Physical Exam Vitals reviewed.  Constitutional:      Appearance: Normal appearance.  HENT:     Head: Normocephalic.     Mouth/Throat:     Mouth: Mucous membranes are moist.     Pharynx: Oropharynx is clear.  Eyes:     Extraocular Movements: Extraocular movements intact.     Pupils: Pupils are equal, round, and reactive to light.  Cardiovascular:     Rate and Rhythm: Normal rate and regular rhythm.     Pulses: Normal pulses.     Heart sounds: Normal heart sounds.  Pulmonary:     Effort: Pulmonary effort is normal.     Breath sounds: Normal breath sounds.  Abdominal:     Palpations: Abdomen is soft.     Tenderness: There is no abdominal tenderness.  Skin:    General: Skin is warm and dry.  Neurological:     Mental Status: He is alert and oriented to person, place, and time.  Psychiatric:        Behavior: Behavior normal.      ASSESSMENT & PLAN: A total of 48 minutes was spent with the patient and counseling/coordination of care regarding preparing for this visit, review of available medical records, establishing care with me, comprehensive history and physical examination, review of multiple chronic medical conditions and their management, review of all medications, review of most recent bloodwork results, review of health maintenance items, education on nutrition, prognosis, documentation, and need for follow up.   Problem List Items Addressed This Visit       Cardiovascular and Mediastinum   Hypertension associated with diabetes (HCC) - Primary   BP Readings from Last 3 Encounters:  10/05/23 138/82  06/06/23 123/81  05/17/23 (!) 158/80  Well-controlled hypertension Takes olmesartan  40 mg daily Stop furosemide .  No clear reason for taking it Blood work done today.  Will check hemoglobin A1c Has not been taking Mounjaro  for  several weeks Recommend to re-start Mounjaro  10 mg weekly Cardiovascular risks associated with diabetes and hypertension discussed Diet and nutrition discussed Follow-up in 3 months      Relevant Medications   tirzepatide  (MOUNJARO ) 10 MG/0.5ML Pen   rosuvastatin  (CRESTOR ) 10 MG tablet   olmesartan  (BENICAR ) 40 MG tablet   Other Relevant Orders   Microalbumin / creatinine urine  ratio   CBC with Differential/Platelet   Comprehensive metabolic panel with GFR   Hemoglobin A1c   Lipid panel     Endocrine   Hyperlipidemia associated with type 2 diabetes mellitus (HCC)   Blood work done today including lipid profile and hemoglobin A1c Restart Mounjaro  Continue rosuvastatin  10 mg daily.  Has not been taking it. Cardiovascular risks associated with dyslipidemia and diabetes discussed Diet and nutrition discussed Follow-up in 3 months      Relevant Medications   tirzepatide  (MOUNJARO ) 10 MG/0.5ML Pen   rosuvastatin  (CRESTOR ) 10 MG tablet   olmesartan  (BENICAR ) 40 MG tablet   Other Relevant Orders   Microalbumin / creatinine urine ratio   CBC with Differential/Platelet   Comprehensive metabolic panel with GFR   Hemoglobin A1c   Lipid panel   CKD stage 2 due to type 2 diabetes mellitus (HCC)   Advised to stay well-hydrated and avoid NSAIDs May benefit from SGLT inhibitor Blood work done today      Relevant Medications   tirzepatide  (MOUNJARO ) 10 MG/0.5ML Pen   rosuvastatin  (CRESTOR ) 10 MG tablet   olmesartan  (BENICAR ) 40 MG tablet     Other   Gout   History of gout.  No recent flareups. Not taking allopurinol  Uric acid level done today      Other Visit Diagnoses       Encounter to establish care         Essential hypertension       Relevant Medications   rosuvastatin  (CRESTOR ) 10 MG tablet   olmesartan  (BENICAR ) 40 MG tablet     History of gout       Relevant Orders   Uric acid      Patient Instructions  Stop furosemide  Start olmesartan  and  rosuvastatin  Start Mounjaro  Blood work today Follow-up in 3 months  Diabetes Mellitus and Nutrition, Adult When you have diabetes, or diabetes mellitus, it is very important to have healthy eating habits because your blood sugar (glucose) levels are greatly affected by what you eat and drink. Eating healthy foods in the right amounts, at about the same times every day, can help you: Manage your blood glucose. Lower your risk of heart disease. Improve your blood pressure. Reach or maintain a healthy weight. What can affect my meal plan? Every person with diabetes is different, and each person has different needs for a meal plan. Your health care provider may recommend that you work with a dietitian to make a meal plan that is best for you. Your meal plan may vary depending on factors such as: The calories you need. The medicines you take. Your weight. Your blood glucose, blood pressure, and cholesterol levels. Your activity level. Other health conditions you have, such as heart or kidney disease. How do carbohydrates affect me? Carbohydrates, also called carbs, affect your blood glucose level more than any other type of food. Eating carbs raises the amount of glucose in your blood. It is important to know how many carbs you can safely have in each meal. This is different for every person. Your dietitian can help you calculate how many carbs you should have at each meal and for each snack. How does alcohol affect me? Alcohol can cause a decrease in blood glucose (hypoglycemia), especially if you use insulin  or take certain diabetes medicines by mouth. Hypoglycemia can be a life-threatening condition. Symptoms of hypoglycemia, such as sleepiness, dizziness, and confusion, are similar to symptoms of having too much alcohol. Do not drink alcohol if: Your  health care provider tells you not to drink. You are pregnant, may be pregnant, or are planning to become pregnant. If you drink  alcohol: Limit how much you have to: 0-1 drink a day for women. 0-2 drinks a day for men. Know how much alcohol is in your drink. In the [place] one drink equals one 12 oz bottle of beer (355 mL), one 5 oz glass of wine (148 mL), or one 1 oz glass of hard liquor (44 mL). Keep yourself hydrated with water, diet soda, or unsweetened iced tea. Keep in mind that regular soda, juice, and other mixers may contain a lot of sugar and must be counted as carbs. What are tips for following this plan?  Reading food labels Start by checking the serving size on the Nutrition Facts label of packaged foods and drinks. The number of calories and the amount of carbs, fats, and other nutrients listed on the label are based on one serving of the item. Many items contain more than one serving per package. Check the total grams (g) of carbs in one serving. Check the number of grams of saturated fats and trans fats in one serving. Choose foods that have a low amount or none of these fats. Check the number of milligrams (mg) of salt (sodium) in one serving. Most people should limit total sodium intake to less than 2,300 mg per day. Always check the nutrition information of foods labeled as "low-fat" or "nonfat." These foods may be higher in added sugar or refined carbs and should be avoided. Talk to your dietitian to identify your daily goals for nutrients listed on the label. Shopping Avoid buying canned, pre-made, or processed foods. These foods tend to be high in fat, sodium, and added sugar. Shop around the outside edge of the grocery store. This is where you will most often find fresh fruits and vegetables, bulk grains, fresh meats, and fresh dairy products. Cooking Use low-heat cooking methods, such as baking, instead of high-heat cooking methods, such as deep frying. Cook using healthy oils, such as olive, canola, or sunflower oil. Avoid cooking with butter, cream, or high-fat meats. Meal planning Eat meals and  snacks regularly, preferably at the same times every day. Avoid going long periods of time without eating. Eat foods that are high in fiber, such as fresh fruits, vegetables, beans, and whole grains. Eat 4-6 oz (112-168 g) of lean protein each day, such as lean meat, chicken, fish, eggs, or tofu. One ounce (oz) (28 g) of lean protein is equal to: 1 oz (28 g) of meat, chicken, or fish. 1 egg.  cup (62 g) of tofu. Eat some foods each day that contain healthy fats, such as avocado, nuts, seeds, and fish. What foods should I eat? Fruits Berries. Apples. Oranges. Peaches. Apricots. Plums. Grapes. Mangoes. Papayas. Pomegranates. Kiwi. Cherries. Vegetables Leafy greens, including lettuce, spinach, kale, chard, collard greens, mustard greens, and cabbage. Beets. Cauliflower. Broccoli. Carrots. Green beans. Tomatoes. Peppers. Onions. Cucumbers. Brussels sprouts. Grains Whole grains, such as whole-wheat or whole-grain bread, crackers, tortillas, cereal, and pasta. Unsweetened oatmeal. Quinoa. Brown or wild rice. Meats and other proteins Seafood. Poultry without skin. Lean cuts of poultry and beef. Tofu. Nuts. Seeds. Dairy Low-fat or fat-free dairy products such as milk, yogurt, and cheese. The items listed above may not be a complete list of foods and beverages you can eat and drink. Contact a dietitian for more information. What foods should I avoid? Fruits Fruits canned with syrup. Vegetables Canned vegetables.  Frozen vegetables with butter or cream sauce. Grains Refined white flour and flour products such as bread, pasta, snack foods, and cereals. Avoid all processed foods. Meats and other proteins Fatty cuts of meat. Poultry with skin. Breaded or fried meats. Processed meat. Avoid saturated fats. Dairy Full-fat yogurt, cheese, or milk. Beverages Sweetened drinks, such as soda or iced tea. The items listed above may not be a complete list of foods and beverages you should avoid. Contact a  dietitian for more information. Questions to ask a health care provider Do I need to meet with a certified diabetes care and education specialist? Do I need to meet with a dietitian? What number can I call if I have questions? When are the best times to check my blood glucose? Where to find more information: American Diabetes Association: diabetes.org Academy of Nutrition and Dietetics: eatright.[employer] of Diabetes and Digestive and Kidney Diseases: StageSync.si Association of Diabetes Care & Education Specialists: diabeteseducator.org Summary It is important to have healthy eating habits because your blood sugar (glucose) levels are greatly affected by what you eat and drink. It is important to use alcohol carefully. A healthy meal plan will help you manage your blood glucose and lower your risk of heart disease. Your health care provider may recommend that you work with a dietitian to make a meal plan that is best for you. This information is not intended to replace advice given to you by your health care provider. Make sure you discuss any questions you have with your health care provider. Document Revised: 12/17/2019 Document Reviewed: 12/18/2019 Elsevier Patient Education  2024 Elsevier Inc.    Maryagnes Small, MD [facility]

## 2023-10-05 NOTE — Assessment & Plan Note (Signed)
 BP Readings from Last 3 Encounters:  10/05/23 138/82  06/06/23 123/81  05/17/23 (!) 158/80  Well-controlled hypertension Takes olmesartan  40 mg daily Stop furosemide .  No clear reason for taking it Blood work done today.  Will check hemoglobin A1c Has not been taking Mounjaro  for several weeks Recommend to re-start Mounjaro  10 mg weekly Cardiovascular risks associated with diabetes and hypertension discussed Diet and nutrition discussed Follow-up in 3 months

## 2023-10-10 ENCOUNTER — Other Ambulatory Visit (HOSPITAL_COMMUNITY): Payer: Self-pay

## 2023-10-10 ENCOUNTER — Telehealth: Payer: Self-pay | Admitting: Emergency Medicine

## 2023-10-10 ENCOUNTER — Telehealth: Payer: Self-pay

## 2023-10-10 NOTE — Telephone Encounter (Signed)
 Pharmacy Patient Advocate Encounter   Received notification from Pt Calls Messages that prior authorization for Mounjaro  10mg /0.61ml is required/requested.   Insurance verification completed.   The patient is insured through [employer] .   Per test claim: PA required; PA submitted to above mentioned insurance via CoverMyMeds Key/confirmation #/EOC [id number] Status is pending

## 2023-10-10 NOTE — Telephone Encounter (Signed)
 Copied from CRM [id number]. Topic: Clinical - Prescription Issue >> Oct 10, 2023 10:31 AM Chuck Crater wrote: Reason for CRM: Patient has Mounjaro  and the Pharmacy is charging $600. Patient called his insurance and they approved it for a discount. Patient said that he needs Authorization.

## 2023-10-10 NOTE — Telephone Encounter (Signed)
 Appt made for further evaluation. Requested a copy of last Physical and PA needed for his mounjaro 

## 2023-10-10 NOTE — Telephone Encounter (Signed)
 Copied from CRM [id number]. Topic: Clinical - Medical Advice >> Oct 10, 2023 10:33 AM Chuck Crater wrote: Reason for CRM: Patient was bit by a tick and has a red spot there. Patient stated that the tick was all the way in there and he wants to know if he has to do anything about it.

## 2023-10-11 ENCOUNTER — Other Ambulatory Visit (HOSPITAL_COMMUNITY): Payer: Self-pay

## 2023-10-11 NOTE — Telephone Encounter (Signed)
 Pharmacy Patient Advocate Encounter  Received notification from [employer] that Prior Authorization for Mounjaro  10mg /0.43ml has been APPROVED from 10/10/23 to 10/09/24   PA #/Case ID/Reference #: [id number]

## 2023-10-16 ENCOUNTER — Encounter: Payer: Self-pay | Admitting: Emergency Medicine

## 2023-10-16 ENCOUNTER — Ambulatory Visit: Admitting: Emergency Medicine

## 2023-10-16 VITALS — BP 118/82 | HR 78 | Temp 98.2°F | Ht 71.5 in | Wt 242.0 lb

## 2023-10-16 DIAGNOSIS — W57XXXA Bitten or stung by nonvenomous insect and other nonvenomous arthropods, initial encounter: Secondary | ICD-10-CM | POA: Insufficient documentation

## 2023-10-16 DIAGNOSIS — S40862A Insect bite (nonvenomous) of left upper arm, initial encounter: Secondary | ICD-10-CM | POA: Insufficient documentation

## 2023-10-16 MED ORDER — DOXYCYCLINE HYCLATE 100 MG PO TABS: 100.0000 mg | ORAL_TABLET | Freq: Two times a day (BID) | ORAL | 0 refills | Status: AC

## 2023-10-16 NOTE — Assessment & Plan Note (Signed)
 Recent onset.  Mild cellulitis at the site. Lyme disease discussed with patient Recommend doxycycline  100 mg twice a day for 7 days Local wound care instructions given Advised to contact the office if no better or worse during the next several days.

## 2023-10-16 NOTE — Progress Notes (Signed)
 Paul Warner 63 y.o.   Chief Complaint  Patient presents with   Tick Removal    Patient states having a tick bite on his left arm that he pulled off himself. He states its not painful, not itchy, a little red and raised. Patient states he was normally on 15 mg mounjaro  but last week he was sent 10mg  he did start this dose and wants to know if he should continue the 10g or get a new rx for the 15mg      HISTORY OF PRESENT ILLNESS: This is a 63 y.o. male complaining of tick bite to left upper arm couple days ago. No other complaint or medical concerns today.  HPI   Prior to Admission medications   Medication Sig Start Date End Date Taking? Authorizing Provider  Blood Glucose Monitoring Suppl (FREESTYLE LITE) DEVI 1 Device by Does not apply route 3 (three) times daily. 09/05/18  Yes [place], NP  Cholecalciferol (VITAMIN D  PO) Take 1 capsule by mouth daily.   Yes [provider]  cyclobenzaprine  (FLEXERIL ) 5 MG tablet Take 1 tablet (5 mg total) by mouth 3 (three) times daily as needed for muscle spasms. 07/25/23  Yes Webb, Padonda B, FNP  doxycycline  (VIBRA -TABS) 100 MG tablet Take 1 tablet (100 mg total) by mouth 2 (two) times daily for 7 days. 10/16/23 10/23/23 Yes Oseas Detty, Isidro Margo, MD  gabapentin  (NEURONTIN ) 300 MG capsule Take  1 capsule  1 to 2 hours  before Bedtime for Sleep & Diabetic Neuropathy 06/06/23  Yes Vangie Genet, MD  glipiZIDE  (GLUCOTROL ) 5 MG tablet Take  1/2 to 1 tablet  2 x /day  with Lunch & Supper  if Glucose is over 200 mg% 06/10/22  Yes Vangie Genet, MD  glucose blood (FREESTYLE LITE) test strip Check blood sugar 1 to 2 times daily. [id number] 08/17/20  Yes Vangie Genet, MD  ibuprofen (ADVIL) 200 MG tablet Take 200 mg by mouth daily as needed (pain).   Yes [provider]  Lancets (FREESTYLE) lancets Check  Blood Sugar  Daily 06/06/23  Yes Vangie Genet, MD  mirabegron  ER (MYRBETRIQ ) 25 MG TB24 tablet Take 1 tablet  Daily for Bladder  control 04/05/23  Yes Vangie Genet, MD  olmesartan  (BENICAR ) 40 MG tablet Take 1 tablet  at Night for BP                                  /                                                                   TAKE                                         BY                                                 MOUTH 10/05/23  Yes Jaeline Whobrey, Isidro Margo, MD  rosuvastatin  (CRESTOR ) 10 MG tablet  Take  1 tablet  Daily  for Cholesterol 10/05/23  Yes Oksana Deberry, Isidro Margo, MD  tirzepatide  (MOUNJARO ) 10 MG/0.5ML Pen Inject 10 mg into the skin once a week. 10/05/23  Yes Elvira Hammersmith, MD  Zinc  50 MG TABS Take 1 tablet Daily 05/18/23  Yes Vangie Genet, MD    Allergies  Allergen Reactions   Codeine  Itching   Nsaids Other (See Comments)    Bleeding ulcer 2013    Patient Active Problem List   Diagnosis Date Noted   Hepatic steatosis 12/25/2019   Asymptomatic gallstones 12/25/2019   History of adenomatous polyp of colon 10/07/2019   CKD stage 2 due to type 2 diabetes mellitus (HCC) 08/16/2017   Gout 09/23/2015   GERD 03/11/2015   T2_NIDDM (HCC) 03/11/2015   Obesity (BMI 30.0-34.9) 04/22/2014   Vitamin D  deficiency 10/01/2013   Testosterone  deficiency 10/01/2013   Hypertension associated with diabetes (HCC)    Hyperlipidemia associated with type 2 diabetes mellitus (HCC)     Past Medical History:  Diagnosis Date   Allergy    seasonal   Arthritis    Blood transfusion 09/2011   2 units   Diabetes mellitus without complication (HCC)    History of adenomatous polyp of colon 10/07/2019   Due 10/03/2022 per Dr. Elvin Hammer   Hyperlipidemia    Hypertension    Renal disorder    Stomach ulcer    Syncope     Past Surgical History:  Procedure Laterality Date   COLONOSCOPY     ESOPHAGOGASTRODUODENOSCOPY  10/26/2011   Procedure: ESOPHAGOGASTRODUODENOSCOPY (EGD);  Surgeon: Tobin Forts, MD;  Location: [facility] ENDOSCOPY;  Service: Endoscopy;  Laterality: N/A;   FRACTURE SURGERY  broken collar bone    POLYPECTOMY     UPPER GASTROINTESTINAL ENDOSCOPY      Social History   Socioeconomic History   Marital status: Married    Spouse name: Paul Warner   Number of children: 1   Years of education: Not on file   Highest education level: Some college, no degree  Occupational History   Not on file  Tobacco Use   Smoking status: Never   Smokeless tobacco: Never  Vaping Use   Vaping status: Never Used  Substance and Sexual Activity   Alcohol use: Yes    Alcohol/week: 1.0 standard drink of alcohol    Types: 1 Cans of beer per week    Comment: occasionally    Drug use: No   Sexual activity: Yes    Birth control/protection: None  Other Topics Concern   Not on file  Social History Narrative   Not on file   Social Drivers of Health   Financial Resource Strain: Not on file  Food Insecurity: Not on file  Transportation Needs: Not on file  Physical Activity: Not on file  Stress: Not on file  Social Connections: Not on file  Intimate Partner Violence: Not on file    Family History  Adopted: Yes  Problem Relation Age of Onset   Breast cancer Mother    Colon cancer Neg Hx    Colon polyps Neg Hx    Esophageal cancer Neg Hx    Rectal cancer Neg Hx    Stomach cancer Neg Hx      Review of Systems  Constitutional: Negative.  Negative for chills and fever.  HENT: Negative.  Negative for congestion.   Respiratory: Negative.  Negative for cough and shortness of breath.   Cardiovascular: Negative.  Negative for chest pain and palpitations.  Gastrointestinal:  Negative for abdominal pain, diarrhea, nausea and vomiting.  Genitourinary: Negative.  Negative for dysuria and hematuria.  Skin:  Positive for rash.  Neurological: Negative.  Negative for dizziness and headaches.  All other systems reviewed and are negative.   Vitals:   10/16/23 1350  BP: 118/82  Pulse: 78  Temp: 98.2 F (36.8 C)  SpO2: 96%    Physical Exam Vitals reviewed.  Constitutional:      Appearance:  Normal appearance.  HENT:     Head: Normocephalic.  Eyes:     Extraocular Movements: Extraocular movements intact.  Cardiovascular:     Rate and Rhythm: Normal rate.  Pulmonary:     Effort: Pulmonary effort is normal.  Skin:    General: Skin is warm and dry.     Findings: Rash present.     Comments: Tick bite site redness left upper arm  Neurological:     Mental Status: He is alert and oriented to person, place, and time.  Psychiatric:        Mood and Affect: Mood normal.        Behavior: Behavior normal.      ASSESSMENT & PLAN: A total of 30 minutes was spent with the patient and counseling/coordination of care regarding preparing for this visit, review of most recent office visit notes, review of most recent blood work results from 10/05/2023, diagnosis of tick bite and possible infection, need for antibiotics, prognosis, documentation, and need for follow-up if no better or worse during the next several days or weeks.  Problem List Items Addressed This Visit       Musculoskeletal and Integument   Tick bite of left upper arm - Primary   Recent onset.  Mild cellulitis at the site. Lyme disease discussed with patient Recommend doxycycline  100 mg twice a day for 7 days Local wound care instructions given Advised to contact the office if no better or worse during the next several days.      Relevant Medications   doxycycline  (VIBRA -TABS) 100 MG tablet   Patient Instructions  Tick Bite Information, Adult  Ticks are insects that can bite. Most ticks live in bushes and grassy areas. They climb onto people and animals that go by. Then they bite. Some ticks carry germs that can make you sick. How can I prevent tick bites? Take these steps: Before you go outside: Wear long sleeves and long pants. Wear light-colored clothes. Tuck your pant legs into your socks. Use an insect repellent that has 20% or higher of the ingredients DEET, picaridin, or IR3535. Follow the instructions  on the label. Put it on: Bare skin. Avoid your eyes and mouth areas. The tops of your boots. Your pant legs. The ends of your sleeves. If you use an insect repellent that has the ingredient permethrin, follow the instructions on the label. Do not put permethrin on the skin. Put it on: Clothing. Shoes. Outdoor gear. Tents. When you are outside Avoid walking through long grass. Stay in the middle of the trail. Do not touch the bushes. Check for ticks on your clothes, hair, and skin often while you are outside. Check again before you go inside. When you go indoors Check your clothes for ticks. Dry your clothes in a dryer on high heat for 10 minutes or more. If clothes are damp, more time may be needed. Wash your clothes right away if they need to be washed. Use hot water. Check your pets and outdoor gear. Shower right away.  Check your body for ticks. Do a full body check using a mirror. Check your clothes, skin, head, neck, armpits, waist, groin, and joint areas. What is the right way to remove a tick? Remove the tick from your skin as soon as possible. Do not remove the tick with your bare fingers. Do not try to remove a tick with heat, alcohol, petroleum jelly, or fingernail polish. To remove a tick that is crawling on your skin: Go outside and brush the tick off. Use tape or a lint roller. To remove a tick that is biting: Wash your hands. If you have gloves, put them on. Use tweezers, curved forceps, or a tick-removal tool to grasp the tick. Grasp the tick as close to your skin and as close to the tick's head as possible. Gently pull up until the tick lets go. Try to keep the tick's head attached to its body. Do not twist or jerk the tick. Do not squeeze or crush the tick. What should I do after taking out a tick? Clean the bite area and your hands with soap and water, rubbing alcohol, or an iodine wash. If you have an antiseptic cream or ointment, put a small amount on the bite  area. Wash and disinfect any instruments that you used to remove the tick. How should I get rid of a live tick? To get rid of a live tick, use one of these methods: Place the tick in rubbing alcohol. Place it in a bag or container you can close tightly. Throw it away. Wrap it tightly in tape. Throw it away. Flush it down the toilet. Where to find more information Centers for Disease Control and Prevention: GyrateAtrophy.si U.S. Environmental Protection Agency: RelocationNetworking.fi Contact a doctor if: You have a fever or chills. You have a red rash that makes a circle (bull's-eye rash) in the bite area. You have redness and swelling where the tick bit you. You have a headache or stiff neck. You have pain in a muscle, joint, or bone. You are more tired than normal. You have trouble walking or moving your legs. You have numbness in your legs. You have tender or swollen lymph glands. You have belly (abdominal) pain, vomiting, watery poop (diarrhea), or weight loss. Get help right away if: You cannot remove a tick. You cannot move (have paralysis) or feel weak. You are feeling worse or have new symptoms. You find a tick that is biting you and filled with blood, especially if you are in an area where diseases from ticks are common. Summary Ticks may carry germs that can make you sick. To prevent tick bites wear long sleeves, long pants, and light colors. Use insect repellent. Follow the instructions on the label. If the tick is biting, remove it right away. Do not try to remove it with heat, alcohol, petroleum jelly, or fingernail polish. Contact a doctor if you have symptoms of a disease after being bitten by a tick. This information is not intended to replace advice given to you by your health care provider. Make sure you discuss any questions you have with your health care provider. Document Revised: 08/16/2021 Document Reviewed: 08/16/2021 Elsevier Patient Education  2024 Elsevier  Inc.      Maryagnes Small, MD [facility]

## 2023-10-16 NOTE — Patient Instructions (Signed)
Tick Bite Information, Adult  Ticks are insects that can bite. Most ticks live in bushes and grassy areas. They climb onto people and animals that go by. Then they bite. Some ticks carry germs that can make you sick. How can I prevent tick bites? Take these steps: Before you go outside: Wear long sleeves and long pants. Wear light-colored clothes. Tuck your pant legs into your socks. Use an insect repellent that has 20% or higher of the ingredients DEET, picaridin, or IR3535. Follow the instructions on the label. Put it on: Bare skin. Avoid your eyes and mouth areas. The tops of your boots. Your pant legs. The ends of your sleeves. If you use an insect repellent that has the ingredient permethrin, follow the instructions on the label. Do not put permethrin on the skin. Put it on: Clothing. Shoes. Outdoor gear. Tents. When you are outside Avoid walking through long grass. Stay in the middle of the trail. Do not touch the bushes. Check for ticks on your clothes, hair, and skin often while you are outside. Check again before you go inside. When you go indoors Check your clothes for ticks. Dry your clothes in a dryer on high heat for 10 minutes or more. If clothes are damp, more time may be needed. Wash your clothes right away if they need to be washed. Use hot water. Check your pets and outdoor gear. Shower right away. Check your body for ticks. Do a full body check using a mirror. Check your clothes, skin, head, neck, armpits, waist, groin, and joint areas. What is the right way to remove a tick? Remove the tick from your skin as soon as possible. Do not remove the tick with your bare fingers. Do not try to remove a tick with heat, alcohol, petroleum jelly, or fingernail polish. To remove a tick that is crawling on your skin: Go outside and brush the tick off. Use tape or a lint roller. To remove a tick that is biting: Wash your hands. If you have gloves, put them on. Use tweezers,  curved forceps, or a tick-removal tool to grasp the tick. Grasp the tick as close to your skin and as close to the tick's head as possible. Gently pull up until the tick lets go. Try to keep the tick's head attached to its body. Do not twist or jerk the tick. Do not squeeze or crush the tick. What should I do after taking out a tick? Clean the bite area and your hands with soap and water, rubbing alcohol, or an iodine wash. If you have an antiseptic cream or ointment, put a small amount on the bite area. Wash and disinfect any instruments that you used to remove the tick. How should I get rid of a live tick? To get rid of a live tick, use one of these methods: Place the tick in rubbing alcohol. Place it in a bag or container you can close tightly. Throw it away. Wrap it tightly in tape. Throw it away. Flush it down the toilet. Where to find more information Centers for Disease Control and Prevention: cdc.gov/ticks U.S. Environmental Protection Agency: epa.gov/insect-repellents Contact a doctor if: You have a fever or chills. You have a red rash that makes a circle (bull's-eye rash) in the bite area. You have redness and swelling where the tick bit you. You have a headache or stiff neck. You have pain in a muscle, joint, or bone. You are more tired than normal. You have trouble walking or   moving your legs. You have numbness in your legs. You have tender or swollen lymph glands. You have belly (abdominal) pain, vomiting, watery poop (diarrhea), or weight loss. Get help right away if: You cannot remove a tick. You cannot move (have paralysis) or feel weak. You are feeling worse or have new symptoms. You find a tick that is biting you and filled with blood, especially if you are in an area where diseases from ticks are common. Summary Ticks may carry germs that can make you sick. To prevent tick bites wear long sleeves, long pants, and light colors. Use insect repellent. Follow the  instructions on the label. If the tick is biting, remove it right away. Do not try to remove it with heat, alcohol, petroleum jelly, or fingernail polish. Contact a doctor if you have symptoms of a disease after being bitten by a tick. This information is not intended to replace advice given to you by your health care provider. Make sure you discuss any questions you have with your health care provider. Document Revised: 08/16/2021 Document Reviewed: 08/16/2021 Elsevier Patient Education  2024 Elsevier Inc.  

## 2023-11-07 ENCOUNTER — Other Ambulatory Visit: Payer: Self-pay | Admitting: Radiology

## 2023-11-07 DIAGNOSIS — E1169 Type 2 diabetes mellitus with other specified complication: Secondary | ICD-10-CM

## 2023-11-07 MED ORDER — TIRZEPATIDE 15 MG/0.5ML ~~LOC~~ SOAJ: 15.0000 mg | SUBCUTANEOUS | 2 refills | Status: DC

## 2023-11-21 ENCOUNTER — Ambulatory Visit: Payer: Commercial Managed Care - PPO | Admitting: Internal Medicine

## 2024-01-09 ENCOUNTER — Encounter: Payer: Self-pay | Admitting: Emergency Medicine

## 2024-01-09 ENCOUNTER — Ambulatory Visit: Admitting: Emergency Medicine

## 2024-01-11 ENCOUNTER — Ambulatory Visit: Admitting: Emergency Medicine

## 2024-01-11 ENCOUNTER — Encounter: Payer: Self-pay | Admitting: Emergency Medicine

## 2024-01-11 VITALS — BP 140/90 | HR 89 | Temp 97.8°F | Ht 71.5 in | Wt 249.0 lb

## 2024-01-11 DIAGNOSIS — I152 Hypertension secondary to endocrine disorders: Secondary | ICD-10-CM

## 2024-01-11 DIAGNOSIS — F5104 Psychophysiologic insomnia: Secondary | ICD-10-CM | POA: Diagnosis not present

## 2024-01-11 DIAGNOSIS — E1159 Type 2 diabetes mellitus with other circulatory complications: Secondary | ICD-10-CM | POA: Diagnosis not present

## 2024-01-11 DIAGNOSIS — E1169 Type 2 diabetes mellitus with other specified complication: Secondary | ICD-10-CM | POA: Diagnosis not present

## 2024-01-11 DIAGNOSIS — E785 Hyperlipidemia, unspecified: Secondary | ICD-10-CM | POA: Diagnosis not present

## 2024-01-11 LAB — POCT GLYCOSYLATED HEMOGLOBIN (HGB A1C): Hemoglobin A1C: 6.1 % — AB (ref 4.0–5.6)

## 2024-01-11 NOTE — Assessment & Plan Note (Signed)
 Hemoglobin A1c is 6.1 Well-controlled diabetes Continue Mounjaro  Continue rosuvastatin  10 mg daily.  Cardiovascular risks associated with dyslipidemia and diabetes discussed Diet and nutrition discussed Follow-up in 6 months

## 2024-01-11 NOTE — Assessment & Plan Note (Signed)
 Sleep hygiene measures discussed Recommend melatonin 10 to 20 mg at bedtime Active and affecting quality of life.  Interfering with ability to lose weight

## 2024-01-11 NOTE — Patient Instructions (Signed)
 Take melatonin Gummies 10 to 20 mg at bedtime.  Available at any pharmacy  Insomnia Insomnia is a sleep disorder that makes it difficult to fall asleep or stay asleep. Insomnia can cause fatigue, low energy, difficulty concentrating, mood swings, and poor performance at work or school. There are three different ways to classify insomnia: Difficulty falling asleep. Difficulty staying asleep. Waking up too early in the morning. Any type of insomnia can be long-term (chronic) or short-term (acute). Both are common. Short-term insomnia usually lasts for 3 months or less. Chronic insomnia occurs at least three times a week for longer than 3 months. What are the causes? Insomnia may be caused by another condition, situation, or substance, such as: Having certain mental health conditions, such as anxiety and depression. Using caffeine, alcohol, tobacco, or drugs. Having gastrointestinal conditions, such as gastroesophageal reflux disease (GERD). Having certain medical conditions. These include: Asthma. Alzheimer's disease. Stroke. Chronic pain. An overactive thyroid  gland (hyperthyroidism). Other sleep disorders, such as restless legs syndrome and sleep apnea. Menopause. Sometimes, the cause of insomnia may not be known. What increases the risk? Risk factors for insomnia include: Gender. Females are affected more often than males. Age. Insomnia is more common as people get older. Stress and certain medical and mental health conditions. Lack of exercise. Having an irregular work schedule. This may include working night shifts and traveling between different time zones. What are the signs or symptoms? If you have insomnia, the main symptom is having trouble falling asleep or having trouble staying asleep. This may lead to other symptoms, such as: Feeling tired or having low energy. Feeling nervous about going to sleep. Not feeling rested in the morning. Having trouble  concentrating. Feeling irritable, anxious, or depressed. How is this diagnosed? This condition may be diagnosed based on: Your symptoms and medical history. Your health care provider may ask about: Your sleep habits. Any medical conditions you have. Your mental health. A physical exam. How is this treated? Treatment for insomnia depends on the cause. Treatment may focus on treating an underlying condition that is causing the insomnia. Treatment may also include: Medicines to help you sleep. Counseling or therapy. Lifestyle adjustments to help you sleep better. Follow these instructions at home: Eating and drinking  Limit or avoid alcohol, caffeinated beverages, and products that contain nicotine and tobacco, especially close to bedtime. These can disrupt your sleep. Do not eat a large meal or eat spicy foods right before bedtime. This can lead to digestive discomfort that can make it hard for you to sleep. Sleep habits  Keep a sleep diary to help you and your health care provider figure out what could be causing your insomnia. Write down: When you sleep. When you wake up during the night. How well you sleep and how rested you feel the next day. Any side effects of medicines you are taking. What you eat and drink. Make your bedroom a dark, comfortable place where it is easy to fall asleep. Put up shades or blackout curtains to block light from outside. Use a white noise machine to block noise. Keep the temperature cool. Limit screen use before bedtime. This includes: Not watching TV. Not using your smartphone, tablet, or computer. Stick to a routine that includes going to bed and waking up at the same times every day and night. This can help you fall asleep faster. Consider making a quiet activity, such as reading, part of your nighttime routine. Try to avoid taking naps during the day so that  you sleep better at night. Get out of bed if you are still awake after 15 minutes of  trying to sleep. Keep the lights down, but try reading or doing a quiet activity. When you feel sleepy, go back to bed. General instructions Take over-the-counter and prescription medicines only as told by your health care provider. Exercise regularly as told by your health care provider. However, avoid exercising in the hours right before bedtime. Use relaxation techniques to manage stress. Ask your health care provider to suggest some techniques that may work well for you. These may include: Breathing exercises. Routines to release muscle tension. Visualizing peaceful scenes. Make sure that you drive carefully. Do not drive if you feel very sleepy. Keep all follow-up visits. This is important. Contact a health care provider if: You are tired throughout the day. You have trouble in your daily routine due to sleepiness. You continue to have sleep problems, or your sleep problems get worse. Get help right away if: You have thoughts about hurting yourself or someone else. Get help right away if you feel like you may hurt yourself or others, or have thoughts about taking your own life. Go to your nearest emergency room or: Call 911. Call the National Suicide Prevention Lifeline at 916-500-9945 or 988. This is open 24 hours a day. Text the Crisis Text Line at (516) 829-5499. Summary Insomnia is a sleep disorder that makes it difficult to fall asleep or stay asleep. Insomnia can be long-term (chronic) or short-term (acute). Treatment for insomnia depends on the cause. Treatment may focus on treating an underlying condition that is causing the insomnia. Keep a sleep diary to help you and your health care provider figure out what could be causing your insomnia. This information is not intended to replace advice given to you by your health care provider. Make sure you discuss any questions you have with your health care provider. Document Revised: 04/26/2021 Document Reviewed: 04/26/2021 Elsevier  Patient Education  2024 [employer].

## 2024-01-11 NOTE — Progress Notes (Signed)
 Paul Warner 63 y.o.   Chief Complaint  Patient presents with   Follow-up    Patient here for 3 month f/u for DM. Patient mentions having trouble sleeping, this has been going on for awhile. States only getting 4-5hrs of sleep. Patient mentions he feels like the mounjaro  is not helping him lose any more weight.     HISTORY OF PRESENT ILLNESS: This is a 63 y.o. male here for 78-month follow-up of diabetes Also complaining of chronic sleeping problems Sleeps 4 to 5 hours per night Has hit a plateau regarding weight loss No other complaints or medical concerns today. Wt Readings from Last 3 Encounters:  01/11/24 249 lb (112.9 kg)  10/16/23 242 lb (109.8 kg)  10/05/23 245 lb (111.1 kg)     HPI   Prior to Admission medications   Medication Sig Start Date End Date Taking? Authorizing Provider  Blood Glucose Monitoring Suppl (FREESTYLE LITE) DEVI 1 Device by Does not apply route 3 (three) times daily. 09/05/18  Yes Jeanine Knee, NP  Cholecalciferol (VITAMIN D  PO) Take 1 capsule by mouth daily.   Yes [provider]  cyclobenzaprine  (FLEXERIL ) 5 MG tablet Take 1 tablet (5 mg total) by mouth 3 (three) times daily as needed for muscle spasms. 07/25/23  Yes Webb, Padonda B, FNP  gabapentin  (NEURONTIN ) 300 MG capsule Take  1 capsule  1 to 2 hours  before Bedtime for Sleep & Diabetic Neuropathy 06/06/23  Yes Tonita Fallow, MD  glipiZIDE  (GLUCOTROL ) 5 MG tablet Take  1/2 to 1 tablet  2 x /day  with Lunch & Supper  if Glucose is over 200 mg% 06/10/22  Yes Tonita Fallow, MD  glucose blood (FREESTYLE LITE) test strip Check blood sugar 1 to 2 times daily. [id number] 08/17/20  Yes Tonita Fallow, MD  ibuprofen (ADVIL) 200 MG tablet Take 200 mg by mouth daily as needed (pain).   Yes [provider]  Lancets (FREESTYLE) lancets Check  Blood Sugar  Daily 06/06/23  Yes Tonita Fallow, MD  mirabegron  ER (MYRBETRIQ ) 25 MG TB24 tablet Take 1 tablet  Daily for Bladder control 04/05/23   Yes Tonita Fallow, MD  olmesartan  (BENICAR ) 40 MG tablet Take 1 tablet  at Night for BP                                  /                                                                   TAKE                                         BY                                                 MOUTH 10/05/23  Yes Purcell Emil Schanz, MD  rosuvastatin  (CRESTOR ) 10 MG tablet Take  1 tablet  Daily  for Cholesterol 10/05/23  Yes  Seamus Warehime Jose, MD  tirzepatide  (MOUNJARO ) 15 MG/0.5ML Pen Inject 15 mg into the skin once a week. 11/07/23  Yes Purcell Emil Schanz, MD  Zinc  50 MG TABS Take 1 tablet Daily 05/18/23  Yes Tonita Fallow, MD  tirzepatide  (MOUNJARO ) 10 MG/0.5ML Pen Inject 10 mg into the skin once a week. Patient not taking: Reported on 01/11/2024 10/05/23   Purcell Emil Schanz, MD    Allergies  Allergen Reactions   Codeine  Itching   Nsaids Other (See Comments)    Bleeding ulcer 2013    Patient Active Problem List   Diagnosis Date Noted   Hepatic steatosis 12/25/2019   Asymptomatic gallstones 12/25/2019   History of adenomatous polyp of colon 10/07/2019   CKD stage 2 due to type 2 diabetes mellitus (HCC) 08/16/2017   Gout 09/23/2015   GERD 03/11/2015   T2_NIDDM (HCC) 03/11/2015   Obesity (BMI 30.0-34.9) 04/22/2014   Vitamin D  deficiency 10/01/2013   Testosterone  deficiency 10/01/2013   Hypertension associated with diabetes (HCC)    Hyperlipidemia associated with type 2 diabetes mellitus (HCC)     Past Medical History:  Diagnosis Date   Allergy    seasonal   Arthritis    Blood transfusion 09/2011   2 units   Diabetes mellitus without complication (HCC)    History of adenomatous polyp of colon 10/07/2019   Due 10/03/2022 per Dr. Abran   Hyperlipidemia    Hypertension    Renal disorder    Stomach ulcer    Syncope     Past Surgical History:  Procedure Laterality Date   COLONOSCOPY     ESOPHAGOGASTRODUODENOSCOPY  10/26/2011   Procedure: ESOPHAGOGASTRODUODENOSCOPY (EGD);   Surgeon: Norleen LOISE Abran, MD;  Location: [facility] ENDOSCOPY;  Service: Endoscopy;  Laterality: N/A;   FRACTURE SURGERY  broken collar bone   POLYPECTOMY     UPPER GASTROINTESTINAL ENDOSCOPY      Social History   Socioeconomic History   Marital status: Married    Spouse name: Alix Stowers   Number of children: 1   Years of education: Not on file   Highest education level: Some college, no degree  Occupational History   Not on file  Tobacco Use   Smoking status: Never   Smokeless tobacco: Never  Vaping Use   Vaping status: Never Used  Substance and Sexual Activity   Alcohol use: Yes    Alcohol/week: 1.0 standard drink of alcohol    Types: 1 Cans of beer per week    Comment: occasionally    Drug use: No   Sexual activity: Yes    Birth control/protection: None  Other Topics Concern   Not on file  Social History Narrative   Not on file   Social Drivers of Health   Financial Resource Strain: Not on file  Food Insecurity: Not on file  Transportation Needs: Not on file  Physical Activity: Not on file  Stress: Not on file  Social Connections: Not on file  Intimate Partner Violence: Not on file    Family History  Adopted: Yes  Problem Relation Age of Onset   Breast cancer Mother    Colon cancer Neg Hx    Colon polyps Neg Hx    Esophageal cancer Neg Hx    Rectal cancer Neg Hx    Stomach cancer Neg Hx      Review of Systems  Constitutional: Negative.  Negative for chills and fever.  HENT: Negative.  Negative for congestion and sore throat.   Respiratory: Negative.  Negative for cough and shortness of breath.   Cardiovascular: Negative.  Negative for chest pain and palpitations.  Gastrointestinal:  Negative for abdominal pain, diarrhea, nausea and vomiting.  Genitourinary: Negative.  Negative for dysuria and hematuria.  Skin: Negative.  Negative for rash.  Neurological: Negative.  Negative for dizziness and headaches.  All other systems reviewed and are  negative.   Vitals:   01/11/24 0814  BP: (!) 140/90  Pulse: 89  Temp: 97.8 F (36.6 C)  SpO2: 97%    Physical Exam Vitals reviewed.  Constitutional:      Appearance: Normal appearance.  HENT:     Head: Normocephalic.     Mouth/Throat:     Mouth: Mucous membranes are moist.     Pharynx: Oropharynx is clear.  Eyes:     Extraocular Movements: Extraocular movements intact.  Cardiovascular:     Rate and Rhythm: Normal rate and regular rhythm.     Pulses: Normal pulses.     Heart sounds: Normal heart sounds.  Pulmonary:     Effort: Pulmonary effort is normal.     Breath sounds: Normal breath sounds.  Musculoskeletal:     Cervical back: No tenderness.  Lymphadenopathy:     Cervical: No cervical adenopathy.  Skin:    General: Skin is warm and dry.     Capillary Refill: Capillary refill takes less than 2 seconds.  Neurological:     General: No focal deficit present.     Mental Status: He is alert and oriented to person, place, and time.  Psychiatric:        Mood and Affect: Mood normal.        Behavior: Behavior normal.     Results for orders placed or performed in visit on 01/11/24 (from the past 24 hours)  POCT HgB A1C     Status: Abnormal   Collection Time: 01/11/24  8:46 AM  Result Value Ref Range   Hemoglobin A1C 6.1 (A) 4.0 - 5.6 %   HbA1c POC (<> result, manual entry)     HbA1c, POC (prediabetic range)     HbA1c, POC (controlled diabetic range)      ASSESSMENT & PLAN:  A total of 44 minutes was spent with the patient and counseling/coordination of care regarding preparing for this visit, review of most recent office visit notes, review of multiple chronic medical conditions and their management, cardiovascular risks associated with hypertension and diabetes, review of all medications, review of most recent bloodwork results including interpretation of today's hemoglobin A1c, review of health maintenance items, education on nutrition, prognosis, documentation,  and need for follow up.   Problem List Items Addressed This Visit       Cardiovascular and Mediastinum   Hypertension associated with diabetes (HCC) - Primary   BP Readings from Last 3 Encounters:  01/11/24 (!) 140/90  10/16/23 118/82  10/05/23 138/82  Elevated blood pressure in the office today but normal readings at home Takes olmesartan  40 mg daily Hemoglobin A1c is 6.1 Continue Mounjaro  15 mg weekly Cardiovascular risks associated with diabetes and hypertension discussed Diet and nutrition discussed Follow-up in 3 months         Endocrine   Hyperlipidemia associated with type 2 diabetes mellitus (HCC)   Hemoglobin A1c is 6.1 Well-controlled diabetes Continue Mounjaro  Continue rosuvastatin  10 mg daily.  Cardiovascular risks associated with dyslipidemia and diabetes discussed Diet and nutrition discussed Follow-up in 6 months      Relevant Orders   POCT HgB A1C (Completed)  Amb Ref to Medical Weight Management     Other   Chronic insomnia   Sleep hygiene measures discussed Recommend melatonin 10 to 20 mg at bedtime Active and affecting quality of life.  Interfering with ability to lose weight        Patient Instructions  Take melatonin Gummies 10 to 20 mg at bedtime.  Available at any pharmacy  Insomnia Insomnia is a sleep disorder that makes it difficult to fall asleep or stay asleep. Insomnia can cause fatigue, low energy, difficulty concentrating, mood swings, and poor performance at work or school. There are three different ways to classify insomnia: Difficulty falling asleep. Difficulty staying asleep. Waking up too early in the morning. Any type of insomnia can be long-term (chronic) or short-term (acute). Both are common. Short-term insomnia usually lasts for 3 months or less. Chronic insomnia occurs at least three times a week for longer than 3 months. What are the causes? Insomnia may be caused by another condition, situation, or substance, such  as: Having certain mental health conditions, such as anxiety and depression. Using caffeine, alcohol, tobacco, or drugs. Having gastrointestinal conditions, such as gastroesophageal reflux disease (GERD). Having certain medical conditions. These include: Asthma. Alzheimer's disease. Stroke. Chronic pain. An overactive thyroid  gland (hyperthyroidism). Other sleep disorders, such as restless legs syndrome and sleep apnea. Menopause. Sometimes, the cause of insomnia may not be known. What increases the risk? Risk factors for insomnia include: Gender. Females are affected more often than males. Age. Insomnia is more common as people get older. Stress and certain medical and mental health conditions. Lack of exercise. Having an irregular work schedule. This may include working night shifts and traveling between different time zones. What are the signs or symptoms? If you have insomnia, the main symptom is having trouble falling asleep or having trouble staying asleep. This may lead to other symptoms, such as: Feeling tired or having low energy. Feeling nervous about going to sleep. Not feeling rested in the morning. Having trouble concentrating. Feeling irritable, anxious, or depressed. How is this diagnosed? This condition may be diagnosed based on: Your symptoms and medical history. Your health care provider may ask about: Your sleep habits. Any medical conditions you have. Your mental health. A physical exam. How is this treated? Treatment for insomnia depends on the cause. Treatment may focus on treating an underlying condition that is causing the insomnia. Treatment may also include: Medicines to help you sleep. Counseling or therapy. Lifestyle adjustments to help you sleep better. Follow these instructions at home: Eating and drinking  Limit or avoid alcohol, caffeinated beverages, and products that contain nicotine and tobacco, especially close to bedtime. These can  disrupt your sleep. Do not eat a large meal or eat spicy foods right before bedtime. This can lead to digestive discomfort that can make it hard for you to sleep. Sleep habits  Keep a sleep diary to help you and your health care provider figure out what could be causing your insomnia. Write down: When you sleep. When you wake up during the night. How well you sleep and how rested you feel the next day. Any side effects of medicines you are taking. What you eat and drink. Make your bedroom a dark, comfortable place where it is easy to fall asleep. Put up shades or blackout curtains to block light from outside. Use a white noise machine to block noise. Keep the temperature cool. Limit screen use before bedtime. This includes: Not watching TV. Not using your smartphone,  tablet, or computer. Stick to a routine that includes going to bed and waking up at the same times every day and night. This can help you fall asleep faster. Consider making a quiet activity, such as reading, part of your nighttime routine. Try to avoid taking naps during the day so that you sleep better at night. Get out of bed if you are still awake after 15 minutes of trying to sleep. Keep the lights down, but try reading or doing a quiet activity. When you feel sleepy, go back to bed. General instructions Take over-the-counter and prescription medicines only as told by your health care provider. Exercise regularly as told by your health care provider. However, avoid exercising in the hours right before bedtime. Use relaxation techniques to manage stress. Ask your health care provider to suggest some techniques that may work well for you. These may include: Breathing exercises. Routines to release muscle tension. Visualizing peaceful scenes. Make sure that you drive carefully. Do not drive if you feel very sleepy. Keep all follow-up visits. This is important. Contact a health care provider if: You are tired throughout  the day. You have trouble in your daily routine due to sleepiness. You continue to have sleep problems, or your sleep problems get worse. Get help right away if: You have thoughts about hurting yourself or someone else. Get help right away if you feel like you may hurt yourself or others, or have thoughts about taking your own life. Go to your nearest emergency room or: Call 911. Call the National Suicide Prevention Lifeline at 501-015-6794 or 988. This is open 24 hours a day. Text the Crisis Text Line at 931-566-9546. Summary Insomnia is a sleep disorder that makes it difficult to fall asleep or stay asleep. Insomnia can be long-term (chronic) or short-term (acute). Treatment for insomnia depends on the cause. Treatment may focus on treating an underlying condition that is causing the insomnia. Keep a sleep diary to help you and your health care provider figure out what could be causing your insomnia. This information is not intended to replace advice given to you by your health care provider. Make sure you discuss any questions you have with your health care provider. Document Revised: 04/26/2021 Document Reviewed: 04/26/2021 Elsevier Patient Education  2024 [employer]     Emil Schaumann, MD [facility]

## 2024-01-11 NOTE — Assessment & Plan Note (Signed)
 BP Readings from Last 3 Encounters:  01/11/24 (!) 140/90  10/16/23 118/82  10/05/23 138/82  Elevated blood pressure in the office today but normal readings at home Takes olmesartan  40 mg daily Hemoglobin A1c is 6.1 Continue Mounjaro  15 mg weekly Cardiovascular risks associated with diabetes and hypertension discussed Diet and nutrition discussed Follow-up in 3 months

## 2024-01-18 ENCOUNTER — Encounter (INDEPENDENT_AMBULATORY_CARE_PROVIDER_SITE_OTHER): Payer: Self-pay

## 2024-02-21 ENCOUNTER — Institutional Professional Consult (permissible substitution) (INDEPENDENT_AMBULATORY_CARE_PROVIDER_SITE_OTHER): Payer: Self-pay | Admitting: Nurse Practitioner

## 2024-02-21 ENCOUNTER — Ambulatory Visit: Payer: Commercial Managed Care - PPO | Admitting: Nurse Practitioner

## 2024-03-07 ENCOUNTER — Institutional Professional Consult (permissible substitution) (INDEPENDENT_AMBULATORY_CARE_PROVIDER_SITE_OTHER): Admitting: Nurse Practitioner

## 2024-03-28 DIAGNOSIS — Z0289 Encounter for other administrative examinations: Secondary | ICD-10-CM

## 2024-04-01 ENCOUNTER — Encounter (INDEPENDENT_AMBULATORY_CARE_PROVIDER_SITE_OTHER): Payer: Self-pay | Admitting: Nurse Practitioner

## 2024-04-01 ENCOUNTER — Ambulatory Visit (INDEPENDENT_AMBULATORY_CARE_PROVIDER_SITE_OTHER): Payer: Self-pay | Admitting: Nurse Practitioner

## 2024-04-01 VITALS — BP 136/79 | HR 82 | Temp 98.4°F | Ht 70.0 in | Wt 245.0 lb

## 2024-04-01 DIAGNOSIS — E1169 Type 2 diabetes mellitus with other specified complication: Secondary | ICD-10-CM

## 2024-04-01 DIAGNOSIS — Z7984 Long term (current) use of oral hypoglycemic drugs: Secondary | ICD-10-CM

## 2024-04-01 DIAGNOSIS — E1159 Type 2 diabetes mellitus with other circulatory complications: Secondary | ICD-10-CM | POA: Diagnosis not present

## 2024-04-01 DIAGNOSIS — E785 Hyperlipidemia, unspecified: Secondary | ICD-10-CM

## 2024-04-01 DIAGNOSIS — Z6835 Body mass index (BMI) 35.0-35.9, adult: Secondary | ICD-10-CM

## 2024-04-01 DIAGNOSIS — K760 Fatty (change of) liver, not elsewhere classified: Secondary | ICD-10-CM | POA: Diagnosis not present

## 2024-04-01 DIAGNOSIS — I152 Hypertension secondary to endocrine disorders: Secondary | ICD-10-CM

## 2024-04-01 DIAGNOSIS — Z7985 Long-term (current) use of injectable non-insulin antidiabetic drugs: Secondary | ICD-10-CM

## 2024-04-01 DIAGNOSIS — Z0289 Encounter for other administrative examinations: Secondary | ICD-10-CM

## 2024-04-01 DIAGNOSIS — E66812 Obesity, class 2: Secondary | ICD-10-CM

## 2024-04-01 DIAGNOSIS — E119 Type 2 diabetes mellitus without complications: Secondary | ICD-10-CM

## 2024-04-01 DIAGNOSIS — E559 Vitamin D deficiency, unspecified: Secondary | ICD-10-CM

## 2024-04-01 NOTE — Progress Notes (Signed)
 [address] [place], [place], [place] [address] Office: [phone]  /  Fax: [phone]   Initial Consultation    Paul Warner was seen in clinic today to evaluate for obesity. He is interested in losing weight to improve overall health and reduce the risk of weight related complications. He presents today to review program treatment options, initial physical assessment, and evaluation.     Paul Warner has been on Mounjaro  15 mg for Type 2 DM since 12/15/22- has not noticed any weight loss at the higher dose but did notice weight loss on lower doses. He is also on Glipizide  5 mg 1/2-1 tab BID. He does not check his blood sugars.  He has taken Gabapentin  in the past for neuropathy in his feet but states it has not been bothering him lately. He has a history of stage 2 CKD- limits NSAID's His hypertension has been well controlled with Olmesartan  40 mg every day.  He describes a high weight of 280- he is 245 today. He has been practicing portion control and limiting simple carbohydrates. He has cut out all regular soda.  Anthropometrics and Bioimpedance Analysis   Body mass index is 35.15 kg/m. Body Fat Mass : 33.0 % Visceral Fat Mass Rating : 20   Obesity Related Diseases and Complications  Obesity Quality of Life and Psychosocial Complications: Body image dissatisfaction and Reduced health-related quality of life  Cardiometabolic: Type 2 diabetes, Dyslipidemia or hypercholesterolemia, Hypertension, DOE, and Fatigue  Biomechanical: Obstructive sleep apnea   Weight Related History  He was referred by: PCP  When asked what they would like to accomplish? 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  Highest weight: 280  Contributing factors: disruption of circadian rhythm /  sleep disordered breathing, consumption of processed foods, use of obesogenic medications:glipizide  and Gabapentin , chronic skipping of meals, multiple weight loss attempts in the past, hectic pace of life, and need for convenient foods  Prior weight loss attempts: Optavia and Low Carb  Current or previous pharmacotherapy: Phentermine  and GLP-1 + GIP  Response to medication: Ineffective so it was discontinued  Current nutrition plan: Portion control / smart choices  Greatest challenge with dieting: no weight loss.  Current level of physical activity: None  Barriers to Exercise: motivation  Readiness and Motivation  On a scale from 0 to 10 How ready are you to make changes to your eating and physical activity to lose weight? 9 How important is it for you to lose weight right now ? 8 How confident are you that you can lose weight if you try? 6  Past Medical History   Past Medical History:  Diagnosis Date   Allergy    seasonal   Arthritis    Blood transfusion 09/2011   2 units   Diabetes mellitus without complication (HCC)    History of adenomatous polyp of colon 10/07/2019   Due 10/03/2022 per Dr. Abran   Hyperlipidemia    Hypertension    Renal disorder    Stomach ulcer    Syncope      Objective    BP 136/79   Pulse 82   Temp 98.4 F (36.9 C)   Ht 5' 10 (1.778 m)   Wt 245 lb (111.1 kg)   SpO2 100%   BMI 35.15 kg/m  He was weighed on the  bioimpedance scale: Body mass index is 35.15 kg/m.    General:  Alert, oriented and cooperative. Patient is in no acute distress.  Respiratory: Normal respiratory effort, no problems with respiration noted   Gait: able to ambulate independently  Mental Status: Normal mood and affect. Normal behavior. Normal judgment and thought content.   Diagnostic Data Reviewed  BMET    Component Value Date/Time   NA 138 10/05/2023 0836   K 4.2 10/05/2023 0836   CL 102 10/05/2023 0836   CO2 28 10/05/2023 0836   GLUCOSE 270 (H)  10/05/2023 0836   BUN 18 10/05/2023 0836   CREATININE 1.13 10/05/2023 0836   CREATININE 0.93 05/17/2023 0938   CALCIUM  9.3 10/05/2023 0836   GFRNONAA >60 10/22/2021 0520   GFRNONAA 80 08/12/2020 1047   GFRAA 93 08/12/2020 1047   Lab Results  Component Value Date   HGBA1C 6.1 (A) 01/11/2024   HGBA1C 6.2 (H) 10/01/2013   No results found for: INSULIN  CBC    Component Value Date/Time   WBC 6.4 10/05/2023 0836   RBC 4.92 10/05/2023 0836   HGB 15.5 10/05/2023 0836   HCT 45.2 10/05/2023 0836   PLT 198.0 10/05/2023 0836   MCV 92.0 10/05/2023 0836   MCH 31.8 05/17/2023 0938   MCHC 34.2 10/05/2023 0836   RDW 12.8 10/05/2023 0836   Iron/TIBC/Ferritin/ %Sat    Component Value Date/Time   IRON 119 05/17/2023 0938   TIBC 344 05/17/2023 0938   FERRITIN 463 (H) 05/17/2023 0938   IRONPCTSAT 35 05/17/2023 0938   Lipid Panel     Component Value Date/Time   CHOL 182 10/05/2023 0836   TRIG 186.0 (H) 10/05/2023 0836   HDL 39.30 10/05/2023 0836   CHOLHDL 5 10/05/2023 0836   VLDL 37.2 10/05/2023 0836   LDLCALC 105 (H) 10/05/2023 0836   LDLCALC 118 (H) 05/17/2023 0938   Hepatic Function Panel     Component Value Date/Time   PROT 7.4 10/05/2023 0836   ALBUMIN 4.3 10/05/2023 0836   AST 22 10/05/2023 0836   ALT 27 10/05/2023 0836   ALKPHOS 51 10/05/2023 0836   BILITOT 0.5 10/05/2023 0836   BILIDIR 0.2 08/17/2017 0905   IBILI 0.7 08/17/2017 0905      Component Value Date/Time   TSH 2.05 05/17/2023 0938    Medications  Outpatient Encounter Medications as of 04/01/2024  Medication Sig   Blood Glucose Monitoring Suppl (FREESTYLE LITE) DEVI 1 Device by Does not apply route 3 (three) times daily.   Cholecalciferol (VITAMIN D  PO) Take 1 capsule by mouth daily.   cyclobenzaprine  (FLEXERIL ) 5 MG tablet Take 1 tablet (5 mg total) by mouth 3 (three) times daily as needed for muscle spasms.   gabapentin  (NEURONTIN ) 300 MG capsule Take  1 capsule  1 to 2 hours  before Bedtime for  Sleep & Diabetic Neuropathy   glipiZIDE  (GLUCOTROL ) 5 MG tablet Take  1/2 to 1 tablet  2 x /day  with Lunch & Supper  if Glucose is over 200 mg%   glucose blood (FREESTYLE LITE) test strip Check blood sugar 1 to 2 times daily. [id number]   ibuprofen (ADVIL) 200 MG tablet Take 200 mg by mouth daily as needed (pain).   Lancets (FREESTYLE) lancets Check  Blood Sugar  Daily   mirabegron  ER (MYRBETRIQ ) 25 MG TB24 tablet Take 1 tablet  Daily for Bladder control   olmesartan  (BENICAR ) 40 MG tablet Take 1 tablet  at Night for BP                                  /  TAKE                                         BY                                                 MOUTH   rosuvastatin  (CRESTOR ) 10 MG tablet Take  1 tablet  Daily  for Cholesterol   tirzepatide  (MOUNJARO ) 15 MG/0.5ML Pen Inject 15 mg into the skin once a week.   Zinc  50 MG TABS Take 1 tablet Daily   No facility-administered encounter medications on file as of 04/01/2024.     Assessment and Plan   Hypertension associated with diabetes (HCC) Continue Olmesartan  40 mg  every day Monitor BP and if consistently >140/90 notify PCP If develops headaches, chest pain, shortness of breath or dizziness go to ER Loss of 10-15% body weight can help improve blood pressures   Metabolic dysfunction-associated steatotic liver disease (MASLD)- diffuse fatty infiltration of liver noted on 10/21/21 CTA Loss of 10-15% body weight can improve fatty deposition in the liver  T2_NIDDM (HCC) Continue Mounjaro  15 mg SQ QW Continue Glipizide  5 mg 1/2 tab BID- if joins the program will try to bring off this medication Decreasing body weight by 10-15% can improve glucose levels  Hyperlipidemia associated with type 2 diabetes mellitus (HCC) He has been prescribed Rosuvastatin  10 mg but is not currently taking.  Loss of 10-15% body weight can improve lipid levels  Vitamin D  deficiency On no current  supplementation. Last level was 32 on 05/17/23 Plan recheck if he decides to start the weight loss program   Class 2 severe obesity with serious comorbidity and body mass index (BMI) of 35.0 to 35.9 in adult, unspecified obesity type Obesity Treatment and Action Plan:  Patient will work on garnering support from family and friends to begin weight loss journey. Will work on eliminating or reducing the presence of highly palatable, calorie dense foods in the home. Will complete provided nutritional and psychosocial assessment questionnaire before the next appointment. Will be scheduled for indirect calorimetry to determine resting energy expenditure in a fasting state.  This will allow us  to create a reduced calorie, high-protein meal plan to promote loss of fat mass while preserving muscle mass. Counseled on the health benefits of losing 5%-15% of total body weight. Was counseled on nutritional approaches to weight loss and benefits of reducing processed foods and consuming plant-based foods and high quality protein as part of nutritional weight management. Was counseled on pharmacotherapy and role as an adjunct in weight management.   Education and Additional resources  He was weighed on the bioimpedance scale and results were discussed and documented in the synopsis.  We discussed obesity as a progressive, chronic disease and the importance of a more detailed evaluation of all the factors contributing to the disease.  We reviewed the basic principles in obesity management.   We discussed the importance of long term lifestyle changes which include nutrition, exercise and behavioral modification as well as the importance of customizing this to his specific health and social needs.  We reviewed the role of medical interventions including pharmacotherapy and surgical interventions.   We discussed the benefits of reaching a healthier weight to alleviate the symptoms of  existing conditions and  reduce the risks of the biomechanical, cardiometabolic and psychological effects of obesity.  We reviewed our program approach and philosophy, which are guided by the four pillars of obesity medicine.  We discussed how to prepare for intake appointment and the importance of fasting and avoidance of stimulants for at least 8 hours prior to indirect calorimetry.  Zion D Steib appears to be in the action stage of change and reports being ready to initiate intensive lifestyle and behavioral modifications as part of their weight loss journey.  Attestation  Reviewed by clinician on day of visit: allergies, medications, problem list, medical history, surgical history, family history, social history, and previous encounter notes pertinent to obesity diagnosis.    Carrson Lightcap ANP-C

## 2024-04-04 ENCOUNTER — Encounter: Payer: Commercial Managed Care - PPO | Admitting: Internal Medicine

## 2024-04-09 ENCOUNTER — Telehealth (INDEPENDENT_AMBULATORY_CARE_PROVIDER_SITE_OTHER): Payer: Self-pay

## 2024-04-09 NOTE — Telephone Encounter (Signed)
 Message from Plan This medication or product was previously approved on [id number] from 2023-10-10 to 2024-10-09.  **Please note: This request was submitted electronically.

## 2024-05-06 ENCOUNTER — Ambulatory Visit (INDEPENDENT_AMBULATORY_CARE_PROVIDER_SITE_OTHER): Admitting: Nurse Practitioner

## 2024-05-06 ENCOUNTER — Encounter (INDEPENDENT_AMBULATORY_CARE_PROVIDER_SITE_OTHER): Payer: Self-pay | Admitting: Nurse Practitioner

## 2024-05-06 VITALS — BP 162/99 | HR 78 | Temp 98.2°F | Ht 70.5 in | Wt 248.0 lb

## 2024-05-06 DIAGNOSIS — Z1331 Encounter for screening for depression: Secondary | ICD-10-CM

## 2024-05-06 DIAGNOSIS — E1169 Type 2 diabetes mellitus with other specified complication: Secondary | ICD-10-CM

## 2024-05-06 DIAGNOSIS — R5383 Other fatigue: Secondary | ICD-10-CM

## 2024-05-06 DIAGNOSIS — I152 Hypertension secondary to endocrine disorders: Secondary | ICD-10-CM

## 2024-05-06 DIAGNOSIS — E1159 Type 2 diabetes mellitus with other circulatory complications: Secondary | ICD-10-CM

## 2024-05-06 DIAGNOSIS — E1165 Type 2 diabetes mellitus with hyperglycemia: Secondary | ICD-10-CM

## 2024-05-06 DIAGNOSIS — Z7985 Long-term (current) use of injectable non-insulin antidiabetic drugs: Secondary | ICD-10-CM

## 2024-05-06 DIAGNOSIS — Z6835 Body mass index (BMI) 35.0-35.9, adult: Secondary | ICD-10-CM

## 2024-05-06 DIAGNOSIS — E559 Vitamin D deficiency, unspecified: Secondary | ICD-10-CM

## 2024-05-06 DIAGNOSIS — E785 Hyperlipidemia, unspecified: Secondary | ICD-10-CM

## 2024-05-06 DIAGNOSIS — E66812 Obesity, class 2: Secondary | ICD-10-CM

## 2024-05-06 DIAGNOSIS — K760 Fatty (change of) liver, not elsewhere classified: Secondary | ICD-10-CM

## 2024-05-06 MED ORDER — TIRZEPATIDE 10 MG/0.5ML ~~LOC~~ SOAJ: 10.0000 mg | SUBCUTANEOUS | 0 refills | Status: DC

## 2024-05-06 NOTE — Progress Notes (Signed)
 [address] [place],  [place], [place] [address]  Office: [phone]  /  Fax: [phone]   Subjective   Initial Visit  Paul Warner (MR# [id number]) is a 63 y.o. male who presents for evaluation and treatment of obesity and related comorbidities. Current BMI is Body mass index is 35.08 kg/m. Paul Warner has been struggling with his weight for many years and has been unsuccessful in either losing weight, maintaining weight loss, or reaching his healthy weight goal.  Paul Warner is currently in the action stage of change and ready to dedicate time achieving and maintaining a healthier weight. Paul Warner is interested in becoming our patient and working on intensive lifestyle modifications including (but not limited to) diet and exercise for weight loss.  Paul Warner has been on Mounjaro  15 mg for Type 2 DM since 12/15/22- has not noticed any weight loss at the higher dose but did notice weight loss on lower doses, did well at 10 mg. He is also on Glipizide  5 mg 1/2-1 tab BID. He does not check his blood sugars.  He has taken Gabapentin  in the past for neuropathy in his feet but states it has not been bothering him lately. He has a history of stage 2 CKD- limits NSAID's He is on Olmesartan  40 mg every day for his BP. He just took his BP med on the way to the office. He does not check his BP at home BP Readings from Last 3 Encounters:  05/06/24 (!) 162/99  04/01/24 136/79  01/11/24 (!) 140/90   He was previously taking Rosuvastatin  10 mg every day for hyperlipidemia but has stopped the medication stating it gave him mental fog.  He does plan to talk to his PCP about medication/treatment. He describes a high weight of 280- he is 245 today. He has been practicing portion control and limiting simple carbohydrates. He has cut out all regular soda.  Weight history:  When asked how their weight has affected their life and health, he states: Has affected self-esteem, Contributed to medical problems, Having fatigue,  and Having poor endurance  When asked what else they would like to accomplish? He states: Adopt a healthier eating pattern and lifestyle, Improve energy levels and physical activity, Improve existing medical conditions, Improve quality of life, Improve appearance, and Improve self-confidence  He starting to note weight gain during : adulthood.  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   Other contributing factors: disruption of circadian rhythm / sleep disordered breathing, consumption of processed foods, use of obesogenic medications: Antiepileptics and Other: glipizide , chronic skipping of meals, multiple weight loss attempts in the past, hectic pace of life, and need for convenient foods.  Their highest weight has been:  280  lbs.  Desired weight: 215  Previous weight-loss programs : [employer] and Low Carb.  Their maximum weight loss was: 15  Their greatest challenge with dieting: traveling on the road for his job. .  Current or previous pharmacotherapy: Phentermine .  Response to medication: Ineffective so it was discontinued   Nutritional History:  Current nutrition plan: None and Portion control / smart choices.  How many times do you eat outside the home: > 7 per week  How often do they skip meals: does not skip meals and skips breakfast  What beverages do they drink: water, caffeinated beverages , regular soda , and unsweetened tea.   Use of artificial sweetners : No  Food intolerances or dislikes: none.  Food triggers: None.  Food cravings: Sugary  Do  they struggle with excessive hunger or portion control : Yes    Physical Activity:  Current level of physical activity: None  Barriers to Exercise: no barriers   Past medical history includes:   Past Medical History:  Diagnosis Date   ADD (attention deficit disorder) without hyperactivity    Allergy    seasonal   Arthritis    Blood transfusion 09/2011   2 units   Diabetes  mellitus without complication (HCC)    History of adenomatous polyp of colon 10/07/2019   Due 10/03/2022 per Dr. Abran   Hyperlipidemia    Hypertension    Joint pain    Renal disorder    Stomach ulcer    Syncope    Vitamin D  deficiency      Objective   BP (!) 162/99   Pulse 78   Temp 98.2 F (36.8 C)   Ht 5' 10.5 (1.791 m)   Wt 248 lb (112.5 kg)   SpO2 99%   BMI 35.08 kg/m  He was weighed on the bioimpedance scale: Body mass index is 35.08 kg/m.    Anthropometrics:  Vitals Temp: 98.2 F (36.8 C) BP: (!) 162/99 Pulse Rate: 78 SpO2: 99 %   Anthropometric Measurements Height: 5' 10.5 (1.791 m) Weight: 248 lb (112.5 kg) BMI (Calculated): 35.07 Weight at Last Visit: N/a Weight Lost Since Last Visit: N/a Weight Gained Since Last Visit: N/a Starting Weight: 248lb Total Weight Loss (lbs): 0 lb (0 kg) Peak Weight: 285lb Waist Measurement : 48.5 inches   Body Composition  Body Fat %: 34.2 % Fat Mass (lbs): 85 lbs Muscle Mass (lbs): 155.4 lbs Total Body Water (lbs): 111 lbs Visceral Fat Rating : 20   Other Clinical Data Fasting: No Labs: No Today's Visit #: 1 Starting Date: 05/06/24    Physical Exam:  General: He is overweight, cooperative, alert, well developed, and in no acute distress. PSYCH: Has normal mood, affect and thought process.   HEENT: EOMI, sclerae are anicteric. Lungs: Normal breathing effort, no conversational dyspnea. Extremities: No edema.  Neurologic: No gross sensory or motor deficits. No tremors or fasciculations noted.    Diagnostic Data Reviewed  EKG: Normal sinus rhythm, rate 86. No conduction abnormalities, abnormal Q waves or chamber enlargement.  Indirect Calorimeter pt did not fast today  Depression Screen  Paul Warner's PHQ-9 score was: 12.     05/06/2024    8:07 AM  Depression screen PHQ 2/9  Decreased Interest 0  Down, Depressed, Hopeless 1  PHQ - 2 Score 1  Altered sleeping 3  Tired, decreased energy 2   Change in appetite 2  Feeling bad or failure about yourself  0  Trouble concentrating 2  Moving slowly or fidgety/restless 2  Suicidal thoughts 0  PHQ-9 Score 12  Difficult doing work/chores Somewhat difficult    Screening for Sleep Related Breathing Disorders  [name] admits to daytime somnolence and admits to waking up still tired. Patient has a history of symptoms of daytime fatigue, morning fatigue, and hypertension. Cederick generally gets 5 hours of sleep per night, and states that he has generally restful sleep. Snoring is not present. Apneic episodes are present. Epworth Sleepiness Score is 15.   Last metabolic panel Lab Results  Component Value Date   GLUCOSE 270 (H) 10/05/2023   NA 138 10/05/2023   K 4.2 10/05/2023   CL 102 10/05/2023   CO2 28 10/05/2023   BUN 18 10/05/2023   CREATININE 1.13 10/05/2023   GFR 69.64 10/05/2023  CALCIUM  9.3 10/05/2023   PROT 7.4 10/05/2023   ALBUMIN 4.3 10/05/2023   BILITOT 0.5 10/05/2023   ALKPHOS 51 10/05/2023   AST 22 10/05/2023   ALT 27 10/05/2023   ANIONGAP 8 10/22/2021     Lab Results  Component Value Date   HGBA1C 6.1 (A) 01/11/2024   HGBA1C 6.2 (H) 10/01/2013    CBC    Component Value Date/Time   WBC 6.4 10/05/2023 0836   RBC 4.92 10/05/2023 0836   HGB 15.5 10/05/2023 0836   HCT 45.2 10/05/2023 0836   PLT 198.0 10/05/2023 0836   MCV 92.0 10/05/2023 0836   MCH 31.8 05/17/2023 0938   MCHC 34.2 10/05/2023 0836   RDW 12.8 10/05/2023 0836    Lipid Panel     Component Value Date/Time   CHOL 182 10/05/2023 0836   TRIG 186.0 (H) 10/05/2023 0836   HDL 39.30 10/05/2023 0836   CHOLHDL 5 10/05/2023 0836   VLDL 37.2 10/05/2023 0836   LDLCALC 105 (H) 10/05/2023 0836   LDLCALC 118 (H) 05/17/2023 0938   Hepatic Function Panel      Component Value Date/Time   TSH 2.05 05/17/2023 0938   Last vitamin D  Lab Results  Component Value Date   VD25OH 32 05/17/2023   Lab Results  Component Value Date   VITAMINB12  668 05/17/2023      Assessment and Plan   Class 2 severe obesity with serious comorbidity and body mass index (BMI) of 35.0 to 35.9 in adult, unspecified obesity type TREATMENT PLAN FOR OBESITY:  Recommended Dietary Goals  Paul Warner is currently in the action stage of change. As such, his goal is to implement medically supervised obesity management plan.  He has agreed to implement: the Category 3 plan - 1500 kcal per day  Behavioral Intervention  We discussed the following Behavioral Modification Strategies today: increasing lean protein intake to established goals, decreasing simple carbohydrates , increasing vegetables, increasing lower glycemic fruits, increasing fiber rich foods, avoiding skipping meals, increasing water intake, work on meal planning and preparation, reading food labels , keeping healthy foods at home, identifying sources and decreasing liquid calories, decreasing eating out or consumption of processed foods, and making healthy choices when eating convenient foods, planning for success, and better snacking choices  Additional resources provided today: Handout on healthy eating and balanced plate, Handout on complex carbohydrates and lean sources of protein, and Handout principles of weight management  Recommended Physical Activity Goals  Rye has been advised to work up to 150 minutes of moderate intensity aerobic activity a week and strengthening exercises 2-3 times per week for cardiovascular health, weight loss maintenance and preservation of muscle mass.   He has agreed to :  Think about enjoyable ways to increase daily physical activity and overcoming barriers to exercise, Increase physical activity in their day and reduce sedentary time (increase NEAT)., Increase volume of physical activity to a goal of 240 minutes a week, and Combine aerobic and strengthening exercises for efficiency and improved cardiometabolic health.  Medical Interventions and  Pharmacotherapy We will work on building a therapist, art and behavioral strategies. We will discuss the role of pharmacotherapy as an adjunct at subsequent visits.   ASSOCIATED CONDITIONS ADDRESSED TODAY  Other Fatigue Paul Warner does feel that his weight is causing his energy to be lower than it should be. Fatigue may be related to obesity, depression or many other causes. Labs will be ordered, and in the meanwhile, Paul Warner will focus on self care including making  healthy food choices, increasing physical activity and focusing on stress reduction. - EKG 12 lead  Shortness of Breath on exertion Paul Warner notes increasing shortness of breath with physical activity and seems to be worsening over time with weight gain. He notes getting out of breath sooner with activity than he used to. This has not gotten worse recently. Paul Warner denies shortness of breath at rest or orthopnea.   Type 2 diabetes mellitus with hyperglycemia, without long-term current use of insulin  (HCC) Start Category 3  meal plan, limit simple carbohydrates Decreasing body weight by 10-15% can improve glucose levels Continue exercise with current goal of 150 minutes of moderate to high intensity exercise/week.  Decrease Mounjaro  to 10 mg SQ QW to determine if this helps control appetite- checking A1c tomorrow Continue regular follow up with PCP Will return tomorrow for Labwork -     Tirzepatide ; Inject 10 mg into the skin once a week.  Dispense: 2 mL; Refill: 0 -     Hemoglobin A1c; Future -     Insulin , random; Future  Hypertension associated with diabetes (HCC) Continue Olmesartan  40 mg  every day Start Category 3 meal plan  and DASH diet Monitor BP and if consistently >140/90 notify PCP If develops headaches, chest pain, shortness of breath or dizziness go to ER Loss of 10-15% body weight can help improve blood pressures  Continue regular follow up with PCP  Metabolic dysfunction-associated steatotic  liver disease (MASLD) Start Category 3 meal plan and focus on limiting saturated fats and simple carbohydrates Loss of 10-15% of body weight can help improve hepatic steatosis   Hyperlipidemia associated with type 2 diabetes mellitus (HCC) Focus on implementing category 3 meal plan, limit saturated fats Loss of 10-15% body weight can improve lipid levels Focus on getting 150 minutes a week of moderate to high intensity exercise  Stressed importance of medication , follow up with PCP to discuss treatment since he has stopped his Rosuvastatin  due to brain fog -     CBC with Differential/Platelet; Future -     Comprehensive metabolic panel with GFR; Future -     Lipid panel; Future  Vitamin D  deficiency If Vit D level remains low will supplement with Ergocalciferol  50000 units once a week for 12 weeks and then recheck level.   -     VITAMIN D  25 Hydroxy (Vit-D Deficiency, Fractures); Future  Class 2 severe obesity with serious comorbidity and body mass index (BMI) of 35.0 to 35.9 in adult, unspecified obesity type See plan above Return tomorrow for fasting labwork and indirect calorimetry- not fasting today -     CBC with Differential/Platelet; Future -     Comprehensive metabolic panel with GFR; Future -     Hemoglobin A1c; Future -     Insulin , random; Future -     Lipid panel; Future -     Magnesium; Future -     VITAMIN D  25 Hydroxy (Vit-D Deficiency, Fractures); Future -     Vitamin B12; Future -     TSH; Future    Follow-up  He was informed of the importance of frequent follow-up visits to maximize his success with intensive lifestyle modifications for his multiple health conditions. He was informed we would discuss his lab results at his next visit unless there is a critical issue that needs to be addressed sooner. Paul Warner agreed to keep his next visit at the agreed upon time to discuss these results.  Attestation Statement  This is the patient's  intake visit at [facility]. The patient's Health Questionnaire was reviewed at length. Included in the packet: current and past health history, medications, allergies, ROS, gynecologic history (women only), surgical history, family history, social history, weight history, weight loss surgery history (for those that have had weight loss surgery), nutritional evaluation, mood and food questionnaire, PHQ9, Epworth questionnaire, sleep habits questionnaire, patient life and health improvement goals questionnaire. These will all be scanned into the patient's chart under media.   During the visit, I independently reviewed the patient's EKG, previous labs, bioimpedance scale results, and indirect calorimetry results. I used this information to medically tailor a meal plan for the patient that will help him to lose weight and will improve his obesity-related conditions. I performed a medically necessary appropriate examination and/or evaluation. I discussed the assessment and treatment plan with the patient. The patient was provided an opportunity to ask questions and all were answered. The patient agreed with the plan and demonstrated an understanding of the instructions. Labs were ordered at this visit and will be reviewed at the next visit unless critical results need to be addressed immediately. Clinical information was updated and documented in the EMR.   In addition, they received basic education on identification of processed foods and reduction of these, different sources of lean proteins and complex carbohydrates and how to eat balanced by incorporation of whole foods.  Reviewed by clinician on day of visit: allergies, medications, problem list, medical history, surgical history, family history, social history, and previous encounter notes.  I personally spent a total of 53 minutes in the care of the patient today including preparing to see the patient, getting/reviewing separately obtained history, performing a  medically appropriate exam/evaluation, counseling and educating, placing orders, documenting clinical information in the EHR, and coordinating care. Does not include time for EKG, depression screening .     Antone Summons ANP-C

## 2024-05-08 ENCOUNTER — Ambulatory Visit (INDEPENDENT_AMBULATORY_CARE_PROVIDER_SITE_OTHER)

## 2024-05-08 NOTE — Addendum Note (Signed)
 Addended by: JUDE LONELL BRAVO on: 05/08/2024 07:58 AM   Modules accepted: Orders

## 2024-05-09 ENCOUNTER — Ambulatory Visit (INDEPENDENT_AMBULATORY_CARE_PROVIDER_SITE_OTHER): Payer: Self-pay | Admitting: Nurse Practitioner

## 2024-05-09 LAB — VITAMIN B12: Vitamin B-12: 1513 pg/mL — ABNORMAL HIGH (ref 232–1245)

## 2024-05-09 LAB — COMPREHENSIVE METABOLIC PANEL WITH GFR
ALT: 34 IU/L (ref 0–44)
AST: 26 IU/L (ref 0–40)
Albumin: 4.7 g/dL (ref 3.9–4.9)
Alkaline Phosphatase: 64 IU/L (ref 47–123)
BUN/Creatinine Ratio: 18 (ref 10–24)
BUN: 20 mg/dL (ref 8–27)
Bilirubin Total: 0.6 mg/dL (ref 0.0–1.2)
CO2: 20 mmol/L (ref 20–29)
Calcium: 9.8 mg/dL (ref 8.6–10.2)
Chloride: 103 mmol/L (ref 96–106)
Creatinine, Ser: 1.12 mg/dL (ref 0.76–1.27)
Globulin, Total: 2.6 g/dL (ref 1.5–4.5)
Glucose: 127 mg/dL — ABNORMAL HIGH (ref 70–99)
Potassium: 4.8 mmol/L (ref 3.5–5.2)
Sodium: 140 mmol/L (ref 134–144)
Total Protein: 7.3 g/dL (ref 6.0–8.5)
eGFR: 74 mL/min/1.73 (ref >59)

## 2024-05-09 LAB — LIPID PANEL
Chol/HDL Ratio: 4.4 ratio (ref 0.0–5.0)
Cholesterol, Total: 200 mg/dL — ABNORMAL HIGH (ref 100–199)
HDL: 45 mg/dL (ref >39)
LDL Chol Calc (NIH): 130 mg/dL — ABNORMAL HIGH (ref 0–99)
Triglycerides: 141 mg/dL (ref 0–149)
VLDL Cholesterol Cal: 25 mg/dL (ref 5–40)

## 2024-05-09 LAB — HEMOGLOBIN A1C
Est. average glucose Bld gHb Est-mCnc: 131 mg/dL
Hgb A1c MFr Bld: 6.2 % — ABNORMAL HIGH (ref 4.8–5.6)

## 2024-05-09 LAB — CBC WITH DIFFERENTIAL/PLATELET
Basophils Absolute: 0.1 x10E3/uL (ref 0.0–0.2)
Basos: 1 %
EOS (ABSOLUTE): 0.3 x10E3/uL (ref 0.0–0.4)
Eos: 4 %
Hematocrit: 48.8 % (ref 37.5–51.0)
Hemoglobin: 16.5 g/dL (ref 13.0–17.7)
Immature Grans (Abs): 0 x10E3/uL (ref 0.0–0.1)
Immature Granulocytes: 0 %
Lymphocytes Absolute: 2.3 x10E3/uL (ref 0.7–3.1)
Lymphs: 27 %
MCH: 32.1 pg (ref 26.6–33.0)
MCHC: 33.8 g/dL (ref 31.5–35.7)
MCV: 95 fL (ref 79–97)
Monocytes Absolute: 0.7 x10E3/uL (ref 0.1–0.9)
Monocytes: 8 %
Neutrophils Absolute: 5.1 x10E3/uL (ref 1.4–7.0)
Neutrophils: 60 %
Platelets: 229 x10E3/uL (ref 150–450)
RBC: 5.14 x10E6/uL (ref 4.14–5.80)
RDW: 12.2 % (ref 11.6–15.4)
WBC: 8.4 x10E3/uL (ref 3.4–10.8)

## 2024-05-09 LAB — TSH: TSH: 3.03 u[IU]/mL (ref 0.450–4.500)

## 2024-05-09 LAB — INSULIN, RANDOM: INSULIN: 58.6 u[IU]/mL — ABNORMAL HIGH (ref 2.6–24.9)

## 2024-05-09 LAB — MAGNESIUM: Magnesium: 2 mg/dL (ref 1.6–2.3)

## 2024-05-09 LAB — VITAMIN D 25 HYDROXY (VIT D DEFICIENCY, FRACTURES): Vit D, 25-Hydroxy: 40 ng/mL (ref 30.0–100.0)

## 2024-05-20 ENCOUNTER — Ambulatory Visit (INDEPENDENT_AMBULATORY_CARE_PROVIDER_SITE_OTHER): Admitting: Nurse Practitioner

## 2024-05-20 ENCOUNTER — Encounter (INDEPENDENT_AMBULATORY_CARE_PROVIDER_SITE_OTHER): Payer: Self-pay | Admitting: Nurse Practitioner

## 2024-05-20 VITALS — BP 158/85 | HR 81 | Temp 98.1°F | Ht 70.5 in | Wt 249.0 lb

## 2024-05-20 DIAGNOSIS — E1165 Type 2 diabetes mellitus with hyperglycemia: Secondary | ICD-10-CM | POA: Diagnosis not present

## 2024-05-20 DIAGNOSIS — E1169 Type 2 diabetes mellitus with other specified complication: Secondary | ICD-10-CM

## 2024-05-20 DIAGNOSIS — E1159 Type 2 diabetes mellitus with other circulatory complications: Secondary | ICD-10-CM | POA: Diagnosis not present

## 2024-05-20 DIAGNOSIS — I152 Hypertension secondary to endocrine disorders: Secondary | ICD-10-CM

## 2024-05-20 DIAGNOSIS — E785 Hyperlipidemia, unspecified: Secondary | ICD-10-CM

## 2024-05-20 DIAGNOSIS — Z7985 Long-term (current) use of injectable non-insulin antidiabetic drugs: Secondary | ICD-10-CM

## 2024-05-20 DIAGNOSIS — E66812 Obesity, class 2: Secondary | ICD-10-CM | POA: Diagnosis not present

## 2024-05-20 DIAGNOSIS — I10 Essential (primary) hypertension: Secondary | ICD-10-CM

## 2024-05-20 DIAGNOSIS — E559 Vitamin D deficiency, unspecified: Secondary | ICD-10-CM

## 2024-05-20 DIAGNOSIS — Z6835 Body mass index (BMI) 35.0-35.9, adult: Secondary | ICD-10-CM

## 2024-05-20 DIAGNOSIS — R7989 Other specified abnormal findings of blood chemistry: Secondary | ICD-10-CM | POA: Diagnosis not present

## 2024-05-20 MED ORDER — TIRZEPATIDE 10 MG/0.5ML ~~LOC~~ SOAJ: 10.0000 mg | SUBCUTANEOUS | 0 refills | Status: DC

## 2024-05-20 NOTE — Progress Notes (Signed)
 " Office: [phone]  /  Fax: [phone]  WEIGHT SUMMARY AND BIOMETRICS  Weight Lost Since Last Visit: 0  Weight Gained Since Last Visit: 1 lb   Vitals Temp: 98.1 F (36.7 C) BP: (!) 158/85 (per patient just took bp meds) Pulse Rate: 81 SpO2: 96 %   Anthropometric Measurements Height: 5' 10.5 (1.791 m) Weight: 249 lb (112.9 kg) BMI (Calculated): 35.21 Weight at Last Visit: 248 lb Weight Lost Since Last Visit: 0 Weight Gained Since Last Visit: 1 lb Starting Weight: 248 lb Total Weight Loss (lbs): 0 lb (0 kg) Peak Weight: 285 lb Waist Measurement : 48.5 inches   Body Composition  Body Fat %: 34.6 % Fat Mass (lbs): 86.2 lbs Muscle Mass (lbs): 155 lbs Total Body Water (lbs): 110.6 lbs Visceral Fat Rating : 21   Other Clinical Data RMR: [id number] Fasting: no Labs: no Today's Visit #: 2 Starting Date: 05/06/24    Total Weight Loss: 0 Bio Impedance Data reviewed with patient: Muscle is down 0.4 pounds and adipose is up 1.2 pounds.   HPI  Chief Complaint: OBESITY  Paul Warner is here to discuss his progress with his obesity treatment plan. He is on the the Category 3 Plan and states he is following his eating plan approximately 50 % of the time. He states he is not currently exercising- bust with work.   Interval History:  Since last office visit he has been getting about 100 grams of protein most days, not skipping meals.  He is drinking half/half tea. He is eating more fresh fruit and vegetables.  Does well with breakfast and dinner.   Paul Warner does have a history of hypertension and is currently on Olmesartan  40 mg every day. He denies headaches, chest pain, shortness of breath and dizziness. States he does not check his BP at home. He just  took his BP med on way to office BP Readings from Last 3 Encounters:  05/20/24 (!) 158/85  05/06/24 (!) 162/99  04/01/24 136/79   Plan to restart Rosuvastatin  10 mg 1/2 tab every night - restarting today.   Lab results  are reviewed with patient in detail.  Fasting glucose is elevated at 127. Vit B12 is elevated- decrease supplement.  A1c is 6.2, Continue Mounjaro  and glipizide . Fasting insulin  is elevated at 58.6 with a HOMA-IR score of 18.4. Kidney functions remain in stage 2. Liver enzymes, electrolytes, Vit D, CBC and thyroid  are all in normal range.   Cancer screenings are reviewed with patient as obesity is a risk cancer for certain cancers.   PSA: 0.27 Colonoscopy: 10/03/19, due 2024   ASCVD risk is reviewed with patient as obesity is a risk factor for cardiovascular disease  The 10-year ASCVD risk score (Arnett DK, et al., 2019) is: 33.7%- HIGH RISK   Values used to calculate the score:     Age: 63 years     Clinically relevant sex: Male     Is Non-Hispanic African American: No     Diabetic: Yes     Tobacco smoker: No     Systolic Blood Pressure: 158 mmHg     Is BP treated: Yes     HDL Cholesterol: 45 mg/dL     Total Cholesterol: 200 mg/dL    PHYSICAL EXAM:  Blood pressure (!) 158/85, pulse 81, temperature 98.1 F (36.7 C), height 5' 10.5 (1.791 m), weight 249 lb (112.9 kg), SpO2 96%. Body mass index is 35.22 kg/m.  General: Well Developed, well nourished, and in no acute  distress.  HEENT: Normocephalic, atraumatic; EOMI, sclerae are anicteric. Skin: Warm and dry, good turgor Chest:  Normal excursion, shape, no gross ABN Respiratory: No conversational dyspnea; speaking in full sentences NeuroM-Sk:  Normal gross ROM * 4 extremities  Psych: A and O X 3, insight adequate, mood- full    DIAGNOSTIC DATA REVIEWED:  BMET    Component Value Date/Time   NA 140 05/08/2024 0800   K 4.8 05/08/2024 0800   CL 103 05/08/2024 0800   CO2 20 05/08/2024 0800   GLUCOSE 127 (H) 05/08/2024 0800   GLUCOSE 270 (H) 10/05/2023 0836   BUN 20 05/08/2024 0800   CREATININE 1.12 05/08/2024 0800   CREATININE 0.93 05/17/2023 0938   CALCIUM  9.8 05/08/2024 0800   GFRNONAA >60 10/22/2021 0520   GFRNONAA 80  08/12/2020 1047   GFRAA 93 08/12/2020 1047   Lab Results  Component Value Date   HGBA1C 6.2 (H) 05/08/2024   HGBA1C 6.2 (H) 10/01/2013   Lab Results  Component Value Date   INSULIN  58.6 (H) 05/08/2024   Lab Results  Component Value Date   TSH 3.030 05/08/2024   CBC    Component Value Date/Time   WBC 8.4 05/08/2024 0800   WBC 6.4 10/05/2023 0836   RBC 5.14 05/08/2024 0800   RBC 4.92 10/05/2023 0836   HGB 16.5 05/08/2024 0800   HCT 48.8 05/08/2024 0800   PLT 229 05/08/2024 0800   MCV 95 05/08/2024 0800   MCH 32.1 05/08/2024 0800   MCH 31.8 05/17/2023 0938   MCHC 33.8 05/08/2024 0800   MCHC 34.2 10/05/2023 0836   RDW 12.2 05/08/2024 0800   Iron Studies    Component Value Date/Time   IRON 119 05/17/2023 0938   TIBC 344 05/17/2023 0938   FERRITIN 463 (H) 05/17/2023 0938   IRONPCTSAT 35 05/17/2023 0938   Lipid Panel     Component Value Date/Time   CHOL 200 (H) 05/08/2024 0800   TRIG 141 05/08/2024 0800   HDL 45 05/08/2024 0800   CHOLHDL 4.4 05/08/2024 0800   CHOLHDL 5 10/05/2023 0836   VLDL 37.2 10/05/2023 0836   LDLCALC 130 (H) 05/08/2024 0800   LDLCALC 118 (H) 05/17/2023 0938   Hepatic Function Panel     Component Value Date/Time   PROT 7.3 05/08/2024 0800   ALBUMIN 4.7 05/08/2024 0800   AST 26 05/08/2024 0800   ALT 34 05/08/2024 0800   ALKPHOS 64 05/08/2024 0800   BILITOT 0.6 05/08/2024 0800   BILIDIR 0.2 08/17/2017 0905   IBILI 0.7 08/17/2017 0905      Component Value Date/Time   TSH 3.030 05/08/2024 0800   Nutritional Lab Results  Component Value Date   VD25OH 40.0 05/08/2024   VD25OH 32 05/17/2023   VD25OH 37 08/29/2022     ASSESSMENT AND PLAN  Class 2 severe obesity with serious comorbidity and body mass index (BMI) of 35.0 to 35.9 in adult, unspecified obesity type  TREATMENT PLAN FOR OBESITY:  Recommended Dietary Goals  Paul Warner is currently in the action stage of change. As such, his goal is to continue weight management  plan. He has agreed to the Category 3 Plan.  Behavioral Intervention  We discussed the following Behavioral Modification Strategies today: increasing lean protein intake to established goals, decreasing simple carbohydrates , increasing vegetables, increasing fiber rich foods, increasing water intake , identifying sources and decreasing liquid calories, decreasing eating out or consumption of processed foods, and making healthy choices when eating convenient foods, celebration eating strategies, continue  to work on maintaining a reduced calorie state, getting the recommended amount of protein, incorporating whole foods, making healthy choices, staying well hydrated and practicing mindfulness when eating., and increase protein intake, fibrous foods (25 grams per day for women, 30 grams for men) and water to improve satiety and decrease hunger signals. .  Additional resources provided today: Eating out guide He plans to enjoy his holidays - He does not want to follow a structured plan but is willing to work on increasing lean protein and vegetables - He will work on portion control and minimize indulging in foods that are high calorie - He understands this strategy is to minimize weight gain, and he is OK with gaining 1-2 pounds in December as he plans to take it off in January   Recommended Physical Activity Goals  Kimo has been advised to work up to 150 minutes of moderate intensity aerobic activity a week and strengthening exercises 2-3 times per week for cardiovascular health, weight loss maintenance and preservation of muscle mass.   He has agreed to Think about enjoyable ways to increase daily physical activity and overcoming barriers to exercise and Increase physical activity in their day and reduce sedentary time (increase NEAT).   Pharmacotherapy We discussed various medication options to help Khan with his weight loss efforts and we both agreed to continue Mounjaro  10 mg SQ QW for  Type 2 Diabetes Mellitus and weight loss.  ASSOCIATED CONDITIONS ADDRESSED TODAY  Action/Plan  Type 2 diabetes mellitus with hyperglycemia, without long-term current use of insulin  (HCC) Continue to implement Category 3  meal plan, limit simple carbohydrates Decreasing body weight by 10-15% can improve glucose levels Continue Mounjaro  10 mg SQ QW- started first shot of this dose yesterday Continue to follow regularly with PCP -     Tirzepatide ; Inject 10 mg into the skin once a week.  Dispense: 2 mL; Refill: 0  Hypertension associated with diabetes (HCC) Continue Olmesartan  every day Continue Category 3 meal plan  and DASH diet Monitor BP and if consistently >140/90 notify PCP If develops headaches, chest pain, shortness of breath or dizziness go to ER Loss of 10-15% body weight can help improve blood pressures  Continue to follow regularly with PCP  Hyperlipidemia associated with type 2 diabetes mellitus (HCC) Focus on implementing category 3 meal plan, limit saturated fats Reviewed ASCVD risk and has agreed to restart Rosuvastatin  10 mg 1/2 tab QHS Loss of 10-15% body weight can improve lipid levels Focus on getting 150 minutes a week of moderate to high intensity exercise   Vitamin D  deficiency Low vitamin D  levels can be associated with adiposity and may result in leptin resistance and weight gain. Also associated with fatigue.  Currently on vitamin D  supplementation without any adverse effects such as nausea, vomiting or muscle weakness.    Elevated B12 level Advised to take supplement every other day as level was elevated on lab   Return in about 3 weeks (around 06/10/2024).SABRA He was informed of the importance of frequent follow up visits to maximize his success with intensive lifestyle modifications for his multiple health conditions.   ATTESTASTION STATEMENTS:  Reviewed by clinician on day of visit: allergies, medications, problem list, medical history, surgical history,  family history, social history, and previous encounter notes.     Lonell Liverpool ANP-C "

## 2024-05-20 NOTE — Patient Instructions (Signed)

## 2024-05-24 ENCOUNTER — Encounter: Payer: Commercial Managed Care - PPO | Admitting: Internal Medicine

## 2024-06-12 ENCOUNTER — Encounter (INDEPENDENT_AMBULATORY_CARE_PROVIDER_SITE_OTHER): Payer: Self-pay | Admitting: Nurse Practitioner

## 2024-06-12 ENCOUNTER — Ambulatory Visit (INDEPENDENT_AMBULATORY_CARE_PROVIDER_SITE_OTHER): Admitting: Nurse Practitioner

## 2024-06-12 VITALS — BP 152/83 | HR 69 | Temp 97.6°F | Ht 70.5 in | Wt 249.0 lb

## 2024-06-12 DIAGNOSIS — Z6835 Body mass index (BMI) 35.0-35.9, adult: Secondary | ICD-10-CM

## 2024-06-12 DIAGNOSIS — E785 Hyperlipidemia, unspecified: Secondary | ICD-10-CM | POA: Diagnosis not present

## 2024-06-12 DIAGNOSIS — E66812 Obesity, class 2: Secondary | ICD-10-CM | POA: Diagnosis not present

## 2024-06-12 DIAGNOSIS — E559 Vitamin D deficiency, unspecified: Secondary | ICD-10-CM | POA: Diagnosis not present

## 2024-06-12 DIAGNOSIS — E1169 Type 2 diabetes mellitus with other specified complication: Secondary | ICD-10-CM

## 2024-06-12 DIAGNOSIS — I10 Essential (primary) hypertension: Secondary | ICD-10-CM | POA: Diagnosis not present

## 2024-06-12 DIAGNOSIS — Z7985 Long-term (current) use of injectable non-insulin antidiabetic drugs: Secondary | ICD-10-CM | POA: Diagnosis not present

## 2024-06-12 DIAGNOSIS — E1159 Type 2 diabetes mellitus with other circulatory complications: Secondary | ICD-10-CM | POA: Diagnosis not present

## 2024-06-12 DIAGNOSIS — E1165 Type 2 diabetes mellitus with hyperglycemia: Secondary | ICD-10-CM

## 2024-06-12 MED ORDER — TIRZEPATIDE 12.5 MG/0.5ML ~~LOC~~ SOAJ: 12.5000 mg | SUBCUTANEOUS | 0 refills | Status: AC

## 2024-06-12 NOTE — Progress Notes (Signed)
 " Office: [phone]  /  Fax: [phone]  WEIGHT SUMMARY AND BIOMETRICS  Weight Lost Since Last Visit: 0  Weight Gained Since Last Visit: 0   Vitals Temp: 97.6 F (36.4 C) BP: (!) 152/83 Pulse Rate: 69 SpO2: 96 %   Anthropometric Measurements Height: 5' 10.5 (1.791 m) Weight: 249 lb (112.9 kg) BMI (Calculated): 35.21 Weight at Last Visit: 249 lb Weight Lost Since Last Visit: 0 Weight Gained Since Last Visit: 0 Starting Weight: 248 lb Total Weight Loss (lbs): 0 lb (0 kg) Peak Weight: 285 lb Waist Measurement : 48.5 inches   Body Composition  Body Fat %: 34.6 % Fat Mass (lbs): 86.4 lbs Muscle Mass (lbs): 155.2 lbs Total Body Water (lbs): 111.6 lbs Visceral Fat Rating : 21   Other Clinical Data Fasting: no Labs: no Today's Visit #: 3 Starting Date: 05/06/24    Total Weight Loss: 0 Bio Impedance Data reviewed with patient: Muscle is up 0.2 pounds, adipose is up 0.2 pounds.  HPI  Chief Complaint: OBESITY  Paul Warner is here to discuss his progress with his obesity treatment plan. He is on the the Category 3 Plan and states he is following his eating plan approximately 50 % of the time. He states he does exercise at work a lot but no other regular exercise.   Interval History:  Since last office visit he had holidays and ate more things off plan. He has not been tracking his calories or protein. He is getting 48 ounces of water a day. He had been getting fruits and vegetables. He does a lot of driving and lifting for work. He is going to set a goal of exercise 3 days a week with cardio and weights.  [place] does have hypertension and he is currently on olmesartan  40 mg  every day. Denies headaches, chest pain shortness of breath at rest and dizziness . His Bp's have been elevated at all 3 of his visits.  BP Readings from Last 3 Encounters:  06/12/24 (!) 152/83  05/20/24 (!) 158/85  05/06/24 (!) 162/99   He does have Type 2 diabetes mellitus and is  currently on Mounjaro  10 mg SQ QW and glipizide  5 mg 1/2-1 tab with supper and lunch for blood sugars greater than 200. He does not check his blood sugar. He has not noticed a difference  in the lowered dose.    PHYSICAL EXAM:  Blood pressure (!) 152/83, pulse 69, temperature 97.6 F (36.4 C), height 5' 10.5 (1.791 m), weight 249 lb (112.9 kg), SpO2 96%. Body mass index is 35.22 kg/m.  General: Well Developed, well nourished, and in no acute distress.  HEENT: Normocephalic, atraumatic; EOMI, sclerae are anicteric. Skin: Warm and dry, good turgor Chest:  Normal excursion, shape, no gross ABN Respiratory: No conversational dyspnea; speaking in full sentences NeuroM-Sk:  Normal gross ROM * 4 extremities  Psych: A and O X 3, insight adequate, mood- full    DIAGNOSTIC DATA REVIEWED:  BMET    Component Value Date/Time   NA 140 05/08/2024 0800   K 4.8 05/08/2024 0800   CL 103 05/08/2024 0800   CO2 20 05/08/2024 0800   GLUCOSE 127 (H) 05/08/2024 0800   GLUCOSE 270 (H) 10/05/2023 0836   BUN 20 05/08/2024 0800   CREATININE 1.12 05/08/2024 0800   CREATININE 0.93 05/17/2023 0938   CALCIUM  9.8 05/08/2024 0800   GFRNONAA >60 10/22/2021 0520   GFRNONAA 80 08/12/2020 1047   GFRAA 93 08/12/2020 1047   Lab Results  Component Value Date   HGBA1C 6.2 (H) 05/08/2024   HGBA1C 6.2 (H) 10/01/2013   Lab Results  Component Value Date   INSULIN  58.6 (H) 05/08/2024   Lab Results  Component Value Date   TSH 3.030 05/08/2024   CBC    Component Value Date/Time   WBC 8.4 05/08/2024 0800   WBC 6.4 10/05/2023 0836   RBC 5.14 05/08/2024 0800   RBC 4.92 10/05/2023 0836   HGB 16.5 05/08/2024 0800   HCT 48.8 05/08/2024 0800   PLT 229 05/08/2024 0800   MCV 95 05/08/2024 0800   MCH 32.1 05/08/2024 0800   MCH 31.8 05/17/2023 0938   MCHC 33.8 05/08/2024 0800   MCHC 34.2 10/05/2023 0836   RDW 12.2 05/08/2024 0800   Iron Studies    Component Value Date/Time   IRON 119 05/17/2023 0938    TIBC 344 05/17/2023 0938   FERRITIN 463 (H) 05/17/2023 0938   IRONPCTSAT 35 05/17/2023 0938   Lipid Panel     Component Value Date/Time   CHOL 200 (H) 05/08/2024 0800   TRIG 141 05/08/2024 0800   HDL 45 05/08/2024 0800   CHOLHDL 4.4 05/08/2024 0800   CHOLHDL 5 10/05/2023 0836   VLDL 37.2 10/05/2023 0836   LDLCALC 130 (H) 05/08/2024 0800   LDLCALC 118 (H) 05/17/2023 0938   Hepatic Function Panel     Component Value Date/Time   PROT 7.3 05/08/2024 0800   ALBUMIN 4.7 05/08/2024 0800   AST 26 05/08/2024 0800   ALT 34 05/08/2024 0800   ALKPHOS 64 05/08/2024 0800   BILITOT 0.6 05/08/2024 0800   BILIDIR 0.2 08/17/2017 0905   IBILI 0.7 08/17/2017 0905      Component Value Date/Time   TSH 3.030 05/08/2024 0800   Nutritional Lab Results  Component Value Date   VD25OH 40.0 05/08/2024   VD25OH 32 05/17/2023   VD25OH 37 08/29/2022     ASSESSMENT AND PLAN Class 2 severe obesity with serious comorbidity and body mass index (BMI) of 35.0 to 35.9 in adult, unspecified obesity type TREATMENT PLAN FOR OBESITY:  Recommended Dietary Goals  [place] is currently in the action stage of change. As such, his goal is to continue weight management plan. He has agreed to the Category 3 Plan.  Behavioral Intervention  We discussed the following Behavioral Modification Strategies today: increasing lean protein intake to established goals, avoiding skipping meals, work on meal planning and preparation, work on tracking and journaling calories using tracking application, continue to work on maintaining a reduced calorie state, getting the recommended amount of protein, incorporating whole foods, making healthy choices, staying well hydrated and practicing mindfulness when eating., and increase protein intake, fibrous foods (25 grams per day for women, 30 grams for men) and water to improve satiety and decrease hunger signals. .   Recommended Physical Activity Goals  Niklaus has been advised  to work up to 150 minutes of moderate intensity aerobic activity a week and strengthening exercises 2-3 times per week for cardiovascular health, weight loss maintenance and preservation of muscle mass.   He has agreed to Patient will begin to exercise - start with cardio for 20 minutes and 10 minutes of weights 3 days a week.    Pharmacotherapy We discussed various medication options to help Amareon with his weight loss efforts and we both agreed to increase Mounjaro  back to 12.5 mg weekly dose as previously on through PCP for Type 2 diabetes mellitus. Strongly encouraged to check blood sugars fasting. Counseled on  side effects and advised to monitor  ASSOCIATED CONDITIONS ADDRESSED TODAY  Action/Plan  Type 2 diabetes mellitus with hyperglycemia, without long-term current use of insulin  (HCC) Continue Category 3  meal plan, limit simple carbohydrates. Increase lean protein , water and fiber.  Increase Tirzepatide  to 12.5 mg SQ QW- monitro for side effects Continue to follow regularly with PCP Continue exercise with current goal of cardio 30 minutes 3 days a week with strength training.  -     Tirzepatide ; Inject 12.5 mg into the skin once a week.  Dispense: 2 mL; Refill: 0  Hypertension associated with diabetes (HCC) Continue Olmesartan  40 mg every day Continue Category 3 meal plan  and DASH diet Monitor BP and if consistently >140/90 notify PCP If develops headaches, chest pain, shortness of breath or dizziness go to ER  Hyperlipidemia associated with type 2 diabetes mellitus (HCC) Focus on implementing category 3 meal plan, limit saturated fats. Increase water, lean protein and fiber.  Continue exercise with current goal of cardio 30 minutes 3 days a week with strength training  Vitamin D  deficiency       Low vitamin D  levels can be associated with adiposity and may result in leptin resistance and weight gain. Also associated with fatigue.        Currently on vitamin D   supplementation without any adverse effects such as nausea, vomiting or muscle weakness.            Return in about 4 weeks (around 07/10/2024).SABRA He was informed of the importance of frequent follow up visits to maximize his success with intensive lifestyle modifications for his multiple health conditions.   ATTESTASTION STATEMENTS:  Reviewed by clinician on day of visit: allergies, medications, problem list, medical history, surgical history, family history, social history, and previous encounter notes.    Lonell Liverpool ANP-C "

## 2024-06-18 ENCOUNTER — Telehealth: Payer: Self-pay

## 2024-06-18 NOTE — Telephone Encounter (Signed)
 Copied from CRM #[id number]. Topic: Clinical - Prescription Issue >> Jun 18, 2024 10:58 AM Olam RAMAN wrote: Reason for CRM:  Pt was told by [facility] about a discount for Monjouro for discount Number for provider to call is: [phone] Cost limited

## 2024-06-19 ENCOUNTER — Telehealth: Payer: Self-pay

## 2024-06-19 ENCOUNTER — Other Ambulatory Visit (HOSPITAL_COMMUNITY): Payer: Self-pay

## 2024-06-19 NOTE — Telephone Encounter (Signed)
 Pharmacy Patient Advocate Encounter  Received notification from [employer] that Prior Authorization for Mounjaro  12.5MG /0.5ML auto-injectors  has been APPROVED from 06/19/2024 to 06/19/2025   PA #/Case ID/Reference #: [id number]

## 2024-06-19 NOTE — Telephone Encounter (Signed)
 Pharmacy Patient Advocate Encounter   Received notification from Physician's Office that prior authorization for Mounjaro  12.5MG /0.5ML auto-injectors  is required/requested.   Insurance verification completed.   The patient is insured through [facility].   Per test claim: PA required; PA submitted to above mentioned insurance via Latent Key/confirmation #/EOC [id number] Status is pending

## 2024-07-11 ENCOUNTER — Ambulatory Visit (INDEPENDENT_AMBULATORY_CARE_PROVIDER_SITE_OTHER): Admitting: Nurse Practitioner
